# Patient Record
Sex: Female | Born: 1940 | Race: White | Hispanic: No | Marital: Single | Smoking: Former smoker
Health system: Southern US, Community
[De-identification: ages and names within clinical notes are randomized; demographics above are authoritative.]

## PROBLEM LIST (undated history)

## (undated) DIAGNOSIS — Z9289 Personal history of other medical treatment: Secondary | ICD-10-CM

## (undated) DIAGNOSIS — I10 Essential (primary) hypertension: Secondary | ICD-10-CM

## (undated) DIAGNOSIS — K5190 Ulcerative colitis, unspecified, without complications: Secondary | ICD-10-CM

## (undated) DIAGNOSIS — M1990 Unspecified osteoarthritis, unspecified site: Secondary | ICD-10-CM

## (undated) DIAGNOSIS — I739 Peripheral vascular disease, unspecified: Secondary | ICD-10-CM

## (undated) DIAGNOSIS — I2510 Atherosclerotic heart disease of native coronary artery without angina pectoris: Secondary | ICD-10-CM

## (undated) HISTORY — DX: Atherosclerotic heart disease of native coronary artery without angina pectoris: I25.10

## (undated) HISTORY — DX: Personal history of other medical treatment: Z92.89

## (undated) HISTORY — PX: CHOLECYSTECTOMY: SHX55

## (undated) HISTORY — DX: Ulcerative colitis, unspecified, without complications: K51.90

## (undated) HISTORY — PX: URETERAL STENT PLACEMENT: SHX822

## (undated) HISTORY — DX: Peripheral vascular disease, unspecified: I73.9

## (undated) HISTORY — PX: ABDOMINAL HYSTERECTOMY: SHX81

## (undated) HISTORY — PX: ABDOMINAL SURGERY: SHX537

## (undated) HISTORY — PX: APPENDECTOMY: SHX54

---

## 1998-04-01 ENCOUNTER — Encounter: Admission: RE | Admit: 1998-04-01 | Discharge: 1998-04-01 | Payer: Self-pay | Admitting: Family Medicine

## 1998-04-15 ENCOUNTER — Encounter: Admission: RE | Admit: 1998-04-15 | Discharge: 1998-04-15 | Payer: Self-pay | Admitting: Family Medicine

## 1998-04-27 ENCOUNTER — Encounter: Admission: RE | Admit: 1998-04-27 | Discharge: 1998-04-27 | Payer: Self-pay | Admitting: Family Medicine

## 1998-05-19 ENCOUNTER — Encounter: Admission: RE | Admit: 1998-05-19 | Discharge: 1998-05-19 | Payer: Self-pay | Admitting: Family Medicine

## 1998-07-02 ENCOUNTER — Encounter: Admission: RE | Admit: 1998-07-02 | Discharge: 1998-07-02 | Payer: Self-pay | Admitting: Family Medicine

## 1998-07-19 ENCOUNTER — Encounter: Admission: RE | Admit: 1998-07-19 | Discharge: 1998-07-19 | Payer: Self-pay | Admitting: Family Medicine

## 1998-08-06 ENCOUNTER — Encounter: Admission: RE | Admit: 1998-08-06 | Discharge: 1998-08-06 | Payer: Self-pay | Admitting: Family Medicine

## 1998-09-07 ENCOUNTER — Encounter: Admission: RE | Admit: 1998-09-07 | Discharge: 1998-09-07 | Payer: Self-pay | Admitting: Sports Medicine

## 1998-09-24 ENCOUNTER — Encounter: Admission: RE | Admit: 1998-09-24 | Discharge: 1998-09-24 | Payer: Self-pay | Admitting: Family Medicine

## 1998-09-24 ENCOUNTER — Other Ambulatory Visit: Admission: RE | Admit: 1998-09-24 | Discharge: 1998-09-24 | Payer: Self-pay | Admitting: *Deleted

## 1998-10-12 ENCOUNTER — Encounter: Admission: RE | Admit: 1998-10-12 | Discharge: 1998-10-12 | Payer: Self-pay | Admitting: Sports Medicine

## 1998-12-03 ENCOUNTER — Encounter: Admission: RE | Admit: 1998-12-03 | Discharge: 1998-12-03 | Payer: Self-pay | Admitting: *Deleted

## 1998-12-13 ENCOUNTER — Encounter: Admission: RE | Admit: 1998-12-13 | Discharge: 1998-12-13 | Payer: Self-pay | Admitting: Family Medicine

## 1998-12-22 ENCOUNTER — Encounter: Admission: RE | Admit: 1998-12-22 | Discharge: 1998-12-22 | Payer: Self-pay | Admitting: Sports Medicine

## 1999-01-17 ENCOUNTER — Encounter: Admission: RE | Admit: 1999-01-17 | Discharge: 1999-01-17 | Payer: Self-pay | Admitting: Family Medicine

## 1999-02-08 ENCOUNTER — Ambulatory Visit (HOSPITAL_BASED_OUTPATIENT_CLINIC_OR_DEPARTMENT_OTHER): Admission: RE | Admit: 1999-02-08 | Discharge: 1999-02-08 | Payer: Self-pay | Admitting: Otolaryngology

## 1999-02-16 ENCOUNTER — Encounter: Admission: RE | Admit: 1999-02-16 | Discharge: 1999-02-16 | Payer: Self-pay | Admitting: Family Medicine

## 1999-02-21 ENCOUNTER — Encounter: Admission: RE | Admit: 1999-02-21 | Discharge: 1999-02-21 | Payer: Self-pay | Admitting: Family Medicine

## 1999-05-12 ENCOUNTER — Encounter: Admission: RE | Admit: 1999-05-12 | Discharge: 1999-05-12 | Payer: Self-pay | Admitting: Family Medicine

## 1999-06-02 ENCOUNTER — Encounter: Admission: RE | Admit: 1999-06-02 | Discharge: 1999-06-02 | Payer: Self-pay | Admitting: Family Medicine

## 1999-08-04 ENCOUNTER — Encounter: Admission: RE | Admit: 1999-08-04 | Discharge: 1999-08-04 | Payer: Self-pay | Admitting: Family Medicine

## 1999-09-07 ENCOUNTER — Encounter: Admission: RE | Admit: 1999-09-07 | Discharge: 1999-09-07 | Payer: Self-pay | Admitting: Family Medicine

## 1999-09-07 ENCOUNTER — Other Ambulatory Visit: Admission: RE | Admit: 1999-09-07 | Discharge: 1999-09-22 | Payer: Self-pay | Admitting: Family Medicine

## 1999-09-14 ENCOUNTER — Encounter: Admission: RE | Admit: 1999-09-14 | Discharge: 1999-09-14 | Payer: Self-pay | Admitting: Family Medicine

## 1999-09-23 ENCOUNTER — Encounter: Admission: RE | Admit: 1999-09-23 | Discharge: 1999-09-23 | Payer: Self-pay | Admitting: Sports Medicine

## 1999-09-30 ENCOUNTER — Encounter: Admission: RE | Admit: 1999-09-30 | Discharge: 1999-09-30 | Payer: Self-pay | Admitting: Family Medicine

## 1999-10-14 ENCOUNTER — Encounter: Payer: Self-pay | Admitting: General Surgery

## 1999-10-14 ENCOUNTER — Encounter: Admission: RE | Admit: 1999-10-14 | Discharge: 1999-10-14 | Payer: Self-pay | Admitting: General Surgery

## 1999-10-26 ENCOUNTER — Encounter: Admission: RE | Admit: 1999-10-26 | Discharge: 1999-10-26 | Payer: Self-pay | Admitting: Family Medicine

## 1999-12-06 ENCOUNTER — Encounter: Admission: RE | Admit: 1999-12-06 | Discharge: 1999-12-06 | Payer: Self-pay | Admitting: Sports Medicine

## 1999-12-16 ENCOUNTER — Encounter: Admission: RE | Admit: 1999-12-16 | Discharge: 1999-12-16 | Payer: Self-pay | Admitting: Sports Medicine

## 2000-03-06 ENCOUNTER — Encounter: Admission: RE | Admit: 2000-03-06 | Discharge: 2000-03-06 | Payer: Self-pay | Admitting: Family Medicine

## 2000-03-27 ENCOUNTER — Encounter: Admission: RE | Admit: 2000-03-27 | Discharge: 2000-03-27 | Payer: Self-pay | Admitting: Family Medicine

## 2000-04-10 ENCOUNTER — Encounter: Payer: Self-pay | Admitting: *Deleted

## 2000-04-10 ENCOUNTER — Encounter: Admission: RE | Admit: 2000-04-10 | Discharge: 2000-04-10 | Payer: Self-pay | Admitting: *Deleted

## 2000-04-12 ENCOUNTER — Encounter: Admission: RE | Admit: 2000-04-12 | Discharge: 2000-04-12 | Payer: Self-pay | Admitting: Family Medicine

## 2000-05-16 ENCOUNTER — Encounter: Admission: RE | Admit: 2000-05-16 | Discharge: 2000-05-16 | Payer: Self-pay | Admitting: Family Medicine

## 2000-06-21 ENCOUNTER — Encounter: Admission: RE | Admit: 2000-06-21 | Discharge: 2000-06-21 | Payer: Self-pay | Admitting: Family Medicine

## 2000-08-30 ENCOUNTER — Encounter: Admission: RE | Admit: 2000-08-30 | Discharge: 2000-08-30 | Payer: Self-pay | Admitting: Family Medicine

## 2000-08-30 ENCOUNTER — Other Ambulatory Visit: Admission: RE | Admit: 2000-08-30 | Discharge: 2000-08-30 | Payer: Self-pay | Admitting: *Deleted

## 2001-01-09 ENCOUNTER — Encounter: Admission: RE | Admit: 2001-01-09 | Discharge: 2001-01-09 | Payer: Self-pay | Admitting: Family Medicine

## 2001-01-31 ENCOUNTER — Encounter: Admission: RE | Admit: 2001-01-31 | Discharge: 2001-01-31 | Payer: Self-pay | Admitting: Family Medicine

## 2001-02-27 ENCOUNTER — Encounter: Admission: RE | Admit: 2001-02-27 | Discharge: 2001-02-27 | Payer: Self-pay | Admitting: Family Medicine

## 2001-03-26 ENCOUNTER — Encounter: Admission: RE | Admit: 2001-03-26 | Discharge: 2001-03-26 | Payer: Self-pay | Admitting: Sports Medicine

## 2001-05-06 ENCOUNTER — Encounter: Admission: RE | Admit: 2001-05-06 | Discharge: 2001-05-06 | Payer: Self-pay | Admitting: Sports Medicine

## 2001-05-06 ENCOUNTER — Other Ambulatory Visit: Admission: RE | Admit: 2001-05-06 | Discharge: 2001-05-06 | Payer: Self-pay | Admitting: *Deleted

## 2001-07-01 ENCOUNTER — Encounter: Admission: RE | Admit: 2001-07-01 | Discharge: 2001-07-01 | Payer: Self-pay | Admitting: Family Medicine

## 2001-07-05 ENCOUNTER — Encounter: Admission: RE | Admit: 2001-07-05 | Discharge: 2001-07-05 | Payer: Self-pay | Admitting: Sports Medicine

## 2001-07-05 ENCOUNTER — Encounter: Payer: Self-pay | Admitting: Sports Medicine

## 2001-07-24 ENCOUNTER — Encounter: Admission: RE | Admit: 2001-07-24 | Discharge: 2001-07-24 | Payer: Self-pay | Admitting: Family Medicine

## 2001-08-29 ENCOUNTER — Encounter: Admission: RE | Admit: 2001-08-29 | Discharge: 2001-08-29 | Payer: Self-pay | Admitting: Urology

## 2001-08-29 ENCOUNTER — Encounter: Payer: Self-pay | Admitting: Urology

## 2001-10-28 ENCOUNTER — Encounter: Payer: Self-pay | Admitting: Urology

## 2001-10-31 ENCOUNTER — Observation Stay (HOSPITAL_COMMUNITY): Admission: RE | Admit: 2001-10-31 | Discharge: 2001-11-01 | Payer: Self-pay | Admitting: Urology

## 2001-12-09 ENCOUNTER — Encounter: Admission: RE | Admit: 2001-12-09 | Discharge: 2001-12-09 | Payer: Self-pay | Admitting: Family Medicine

## 2001-12-24 ENCOUNTER — Encounter: Payer: Self-pay | Admitting: Sports Medicine

## 2001-12-24 ENCOUNTER — Encounter: Admission: RE | Admit: 2001-12-24 | Discharge: 2001-12-24 | Payer: Self-pay | Admitting: *Deleted

## 2002-04-16 ENCOUNTER — Encounter: Admission: RE | Admit: 2002-04-16 | Discharge: 2002-04-16 | Payer: Self-pay | Admitting: Family Medicine

## 2002-05-21 ENCOUNTER — Encounter: Admission: RE | Admit: 2002-05-21 | Discharge: 2002-05-21 | Payer: Self-pay | Admitting: Family Medicine

## 2002-05-21 ENCOUNTER — Ambulatory Visit (HOSPITAL_COMMUNITY): Admission: RE | Admit: 2002-05-21 | Discharge: 2002-05-21 | Payer: Self-pay | Admitting: Family Medicine

## 2002-05-21 ENCOUNTER — Other Ambulatory Visit: Admission: RE | Admit: 2002-05-21 | Discharge: 2002-05-21 | Payer: Self-pay | Admitting: Family Medicine

## 2002-07-28 ENCOUNTER — Encounter: Admission: RE | Admit: 2002-07-28 | Discharge: 2002-07-28 | Payer: Self-pay | Admitting: Family Medicine

## 2002-09-08 ENCOUNTER — Encounter: Admission: RE | Admit: 2002-09-08 | Discharge: 2002-09-08 | Payer: Self-pay | Admitting: Sports Medicine

## 2002-10-16 ENCOUNTER — Encounter: Admission: RE | Admit: 2002-10-16 | Discharge: 2002-10-16 | Payer: Self-pay | Admitting: Family Medicine

## 2002-11-17 ENCOUNTER — Encounter: Admission: RE | Admit: 2002-11-17 | Discharge: 2002-11-17 | Payer: Self-pay | Admitting: Family Medicine

## 2002-12-08 ENCOUNTER — Encounter: Admission: RE | Admit: 2002-12-08 | Discharge: 2002-12-08 | Payer: Self-pay | Admitting: Family Medicine

## 2002-12-18 ENCOUNTER — Encounter: Admission: RE | Admit: 2002-12-18 | Discharge: 2002-12-18 | Payer: Self-pay | Admitting: Sports Medicine

## 2002-12-18 ENCOUNTER — Encounter: Payer: Self-pay | Admitting: Sports Medicine

## 2003-04-27 ENCOUNTER — Encounter: Admission: RE | Admit: 2003-04-27 | Discharge: 2003-04-27 | Payer: Self-pay | Admitting: Family Medicine

## 2003-06-08 ENCOUNTER — Encounter: Admission: RE | Admit: 2003-06-08 | Discharge: 2003-06-08 | Payer: Self-pay | Admitting: Family Medicine

## 2003-08-25 ENCOUNTER — Encounter (INDEPENDENT_AMBULATORY_CARE_PROVIDER_SITE_OTHER): Payer: Self-pay | Admitting: *Deleted

## 2003-09-01 ENCOUNTER — Encounter: Admission: RE | Admit: 2003-09-01 | Discharge: 2003-09-01 | Payer: Self-pay | Admitting: Family Medicine

## 2003-09-01 ENCOUNTER — Other Ambulatory Visit: Admission: RE | Admit: 2003-09-01 | Discharge: 2003-09-01 | Payer: Self-pay | Admitting: Sports Medicine

## 2003-10-16 ENCOUNTER — Encounter: Admission: RE | Admit: 2003-10-16 | Discharge: 2003-10-16 | Payer: Self-pay | Admitting: Family Medicine

## 2003-11-19 ENCOUNTER — Encounter: Admission: RE | Admit: 2003-11-19 | Discharge: 2003-11-19 | Payer: Self-pay | Admitting: Family Medicine

## 2004-01-22 ENCOUNTER — Encounter: Admission: RE | Admit: 2004-01-22 | Discharge: 2004-01-22 | Payer: Self-pay | Admitting: Family Medicine

## 2004-02-19 ENCOUNTER — Encounter: Admission: RE | Admit: 2004-02-19 | Discharge: 2004-02-19 | Payer: Self-pay | Admitting: Sports Medicine

## 2004-04-25 ENCOUNTER — Ambulatory Visit: Payer: Self-pay | Admitting: Family Medicine

## 2004-05-30 ENCOUNTER — Ambulatory Visit: Payer: Self-pay | Admitting: Sports Medicine

## 2004-05-31 ENCOUNTER — Ambulatory Visit: Payer: Self-pay | Admitting: Family Medicine

## 2004-06-30 ENCOUNTER — Ambulatory Visit: Payer: Self-pay | Admitting: Family Medicine

## 2004-09-22 ENCOUNTER — Ambulatory Visit: Payer: Self-pay | Admitting: Family Medicine

## 2004-10-17 ENCOUNTER — Ambulatory Visit: Payer: Self-pay | Admitting: Family Medicine

## 2004-10-18 ENCOUNTER — Ambulatory Visit: Payer: Self-pay | Admitting: Gastroenterology

## 2004-10-31 ENCOUNTER — Ambulatory Visit: Payer: Self-pay | Admitting: Internal Medicine

## 2004-11-03 ENCOUNTER — Ambulatory Visit: Payer: Self-pay | Admitting: Family Medicine

## 2004-12-12 ENCOUNTER — Ambulatory Visit: Payer: Self-pay | Admitting: Family Medicine

## 2004-12-19 ENCOUNTER — Ambulatory Visit: Payer: Self-pay | Admitting: Family Medicine

## 2005-01-09 ENCOUNTER — Ambulatory Visit: Payer: Self-pay | Admitting: Internal Medicine

## 2005-01-12 ENCOUNTER — Ambulatory Visit: Payer: Self-pay | Admitting: Internal Medicine

## 2005-01-12 ENCOUNTER — Encounter (INDEPENDENT_AMBULATORY_CARE_PROVIDER_SITE_OTHER): Payer: Self-pay | Admitting: *Deleted

## 2005-01-12 ENCOUNTER — Other Ambulatory Visit: Admission: RE | Admit: 2005-01-12 | Discharge: 2005-01-12 | Payer: Self-pay | Admitting: Internal Medicine

## 2005-04-06 ENCOUNTER — Ambulatory Visit: Payer: Self-pay | Admitting: Internal Medicine

## 2005-05-02 ENCOUNTER — Ambulatory Visit: Payer: Self-pay | Admitting: Family Medicine

## 2005-05-23 ENCOUNTER — Ambulatory Visit: Payer: Self-pay | Admitting: Internal Medicine

## 2005-05-25 ENCOUNTER — Ambulatory Visit (HOSPITAL_COMMUNITY): Admission: RE | Admit: 2005-05-25 | Discharge: 2005-05-25 | Payer: Self-pay | Admitting: Internal Medicine

## 2005-05-31 ENCOUNTER — Ambulatory Visit: Payer: Self-pay | Admitting: Internal Medicine

## 2005-06-07 ENCOUNTER — Ambulatory Visit: Payer: Self-pay | Admitting: Cardiology

## 2005-08-15 ENCOUNTER — Ambulatory Visit: Payer: Self-pay | Admitting: Internal Medicine

## 2005-08-29 ENCOUNTER — Ambulatory Visit: Payer: Self-pay | Admitting: Family Medicine

## 2006-01-04 ENCOUNTER — Ambulatory Visit: Payer: Self-pay | Admitting: Family Medicine

## 2006-01-09 ENCOUNTER — Ambulatory Visit: Payer: Self-pay | Admitting: Sports Medicine

## 2006-02-14 ENCOUNTER — Ambulatory Visit: Payer: Self-pay | Admitting: Family Medicine

## 2006-02-16 ENCOUNTER — Encounter: Admission: RE | Admit: 2006-02-16 | Discharge: 2006-02-16 | Payer: Self-pay | Admitting: Family Medicine

## 2006-03-06 ENCOUNTER — Ambulatory Visit: Payer: Self-pay | Admitting: Sports Medicine

## 2006-03-26 HISTORY — PX: COLON SURGERY: SHX602

## 2006-03-30 ENCOUNTER — Ambulatory Visit: Payer: Self-pay | Admitting: Family Medicine

## 2006-04-13 ENCOUNTER — Encounter: Payer: Self-pay | Admitting: Internal Medicine

## 2006-04-13 ENCOUNTER — Encounter (INDEPENDENT_AMBULATORY_CARE_PROVIDER_SITE_OTHER): Payer: Self-pay | Admitting: *Deleted

## 2006-04-13 ENCOUNTER — Inpatient Hospital Stay (HOSPITAL_COMMUNITY): Admission: EM | Admit: 2006-04-13 | Discharge: 2006-04-20 | Payer: Self-pay | Admitting: Emergency Medicine

## 2006-04-13 ENCOUNTER — Encounter (INDEPENDENT_AMBULATORY_CARE_PROVIDER_SITE_OTHER): Payer: Self-pay | Admitting: Specialist

## 2006-04-13 ENCOUNTER — Ambulatory Visit: Payer: Self-pay | Admitting: Critical Care Medicine

## 2006-04-18 ENCOUNTER — Ambulatory Visit: Payer: Self-pay | Admitting: Internal Medicine

## 2006-05-03 ENCOUNTER — Ambulatory Visit: Payer: Self-pay | Admitting: Sports Medicine

## 2006-05-30 ENCOUNTER — Ambulatory Visit: Payer: Self-pay | Admitting: Family Medicine

## 2006-06-07 ENCOUNTER — Ambulatory Visit: Payer: Self-pay | Admitting: Family Medicine

## 2006-06-13 ENCOUNTER — Ambulatory Visit: Payer: Self-pay | Admitting: Family Medicine

## 2006-07-09 ENCOUNTER — Ambulatory Visit: Payer: Self-pay | Admitting: Family Medicine

## 2006-08-10 ENCOUNTER — Encounter: Payer: Self-pay | Admitting: Family Medicine

## 2006-08-10 ENCOUNTER — Ambulatory Visit: Payer: Self-pay | Admitting: Family Medicine

## 2006-08-23 DIAGNOSIS — F339 Major depressive disorder, recurrent, unspecified: Secondary | ICD-10-CM | POA: Insufficient documentation

## 2006-08-23 DIAGNOSIS — M25559 Pain in unspecified hip: Secondary | ICD-10-CM | POA: Insufficient documentation

## 2006-08-23 DIAGNOSIS — I10 Essential (primary) hypertension: Secondary | ICD-10-CM | POA: Insufficient documentation

## 2006-08-23 DIAGNOSIS — M545 Low back pain, unspecified: Secondary | ICD-10-CM | POA: Insufficient documentation

## 2006-08-23 DIAGNOSIS — K219 Gastro-esophageal reflux disease without esophagitis: Secondary | ICD-10-CM | POA: Insufficient documentation

## 2006-08-23 DIAGNOSIS — N393 Stress incontinence (female) (male): Secondary | ICD-10-CM | POA: Insufficient documentation

## 2006-08-23 DIAGNOSIS — J309 Allergic rhinitis, unspecified: Secondary | ICD-10-CM | POA: Insufficient documentation

## 2006-08-23 DIAGNOSIS — K589 Irritable bowel syndrome without diarrhea: Secondary | ICD-10-CM | POA: Insufficient documentation

## 2006-08-23 DIAGNOSIS — E7800 Pure hypercholesterolemia, unspecified: Secondary | ICD-10-CM | POA: Insufficient documentation

## 2006-08-24 ENCOUNTER — Encounter (INDEPENDENT_AMBULATORY_CARE_PROVIDER_SITE_OTHER): Payer: Self-pay | Admitting: *Deleted

## 2006-09-21 ENCOUNTER — Telehealth: Payer: Self-pay | Admitting: *Deleted

## 2006-10-02 ENCOUNTER — Telehealth: Payer: Self-pay | Admitting: *Deleted

## 2006-10-03 ENCOUNTER — Telehealth: Payer: Self-pay | Admitting: *Deleted

## 2006-10-10 ENCOUNTER — Ambulatory Visit: Payer: Self-pay | Admitting: Internal Medicine

## 2006-10-15 ENCOUNTER — Ambulatory Visit (HOSPITAL_COMMUNITY): Admission: RE | Admit: 2006-10-15 | Discharge: 2006-10-15 | Payer: Self-pay | Admitting: Internal Medicine

## 2006-10-23 ENCOUNTER — Ambulatory Visit (HOSPITAL_COMMUNITY): Admission: RE | Admit: 2006-10-23 | Discharge: 2006-10-23 | Payer: Self-pay | Admitting: Internal Medicine

## 2006-10-23 ENCOUNTER — Telehealth (INDEPENDENT_AMBULATORY_CARE_PROVIDER_SITE_OTHER): Payer: Self-pay | Admitting: *Deleted

## 2006-11-08 ENCOUNTER — Ambulatory Visit: Payer: Self-pay | Admitting: Sports Medicine

## 2006-11-08 ENCOUNTER — Ambulatory Visit: Payer: Self-pay | Admitting: Internal Medicine

## 2006-11-08 ENCOUNTER — Encounter: Payer: Self-pay | Admitting: Family Medicine

## 2006-11-08 DIAGNOSIS — L259 Unspecified contact dermatitis, unspecified cause: Secondary | ICD-10-CM | POA: Insufficient documentation

## 2006-11-21 ENCOUNTER — Telehealth: Payer: Self-pay | Admitting: *Deleted

## 2006-11-22 ENCOUNTER — Encounter: Payer: Self-pay | Admitting: Internal Medicine

## 2006-11-22 ENCOUNTER — Ambulatory Visit: Payer: Self-pay | Admitting: Internal Medicine

## 2006-11-23 ENCOUNTER — Telehealth: Payer: Self-pay | Admitting: Family Medicine

## 2006-12-31 ENCOUNTER — Encounter: Payer: Self-pay | Admitting: *Deleted

## 2007-01-01 ENCOUNTER — Telehealth: Payer: Self-pay | Admitting: Family Medicine

## 2007-01-03 ENCOUNTER — Encounter: Payer: Self-pay | Admitting: Family Medicine

## 2007-01-03 ENCOUNTER — Telehealth: Payer: Self-pay | Admitting: *Deleted

## 2007-01-04 ENCOUNTER — Encounter: Payer: Self-pay | Admitting: *Deleted

## 2007-01-07 ENCOUNTER — Encounter: Payer: Self-pay | Admitting: *Deleted

## 2007-03-19 ENCOUNTER — Telehealth: Payer: Self-pay | Admitting: *Deleted

## 2007-03-26 ENCOUNTER — Telehealth: Payer: Self-pay | Admitting: *Deleted

## 2007-03-27 ENCOUNTER — Ambulatory Visit: Payer: Self-pay | Admitting: Family Medicine

## 2007-03-27 ENCOUNTER — Encounter: Payer: Self-pay | Admitting: Family Medicine

## 2007-03-28 ENCOUNTER — Encounter: Payer: Self-pay | Admitting: Family Medicine

## 2007-03-28 LAB — CONVERTED CEMR LAB
Calcium: 9.5 mg/dL (ref 8.4–10.5)
Creatinine, Ser: 0.71 mg/dL (ref 0.40–1.20)
HCT: 35.1 % — ABNORMAL LOW (ref 36.0–46.0)
HDL: 72 mg/dL (ref >39)
MCV: 93.6 fL (ref 78.0–100.0)
Platelets: 280 10*3/uL (ref 150–400)
RDW: 14.8 % — ABNORMAL HIGH (ref 11.5–14.0)
Triglycerides: 50 mg/dL (ref <150)

## 2007-04-04 ENCOUNTER — Telehealth: Payer: Self-pay | Admitting: Family Medicine

## 2007-04-12 ENCOUNTER — Telehealth: Payer: Self-pay | Admitting: *Deleted

## 2007-04-23 ENCOUNTER — Telehealth (INDEPENDENT_AMBULATORY_CARE_PROVIDER_SITE_OTHER): Payer: Self-pay | Admitting: *Deleted

## 2007-05-21 ENCOUNTER — Ambulatory Visit: Payer: Self-pay | Admitting: Internal Medicine

## 2007-05-21 LAB — CONVERTED CEMR LAB
Basophils Absolute: 0.1 10*3/uL (ref 0.0–0.1)
Hemoglobin: 11.8 g/dL — ABNORMAL LOW (ref 12.0–15.0)
Iron: 45 ug/dL (ref 42–145)
MCHC: 33.7 g/dL (ref 30.0–36.0)
Monocytes Absolute: 0.5 10*3/uL (ref 0.2–0.7)
Monocytes Relative: 9.1 % (ref 3.0–11.0)
RDW: 12.7 % (ref 11.5–14.6)

## 2007-05-31 ENCOUNTER — Encounter: Payer: Self-pay | Admitting: Family Medicine

## 2007-06-03 ENCOUNTER — Telehealth: Payer: Self-pay | Admitting: Family Medicine

## 2007-06-10 ENCOUNTER — Telehealth: Payer: Self-pay | Admitting: Family Medicine

## 2007-07-11 ENCOUNTER — Telehealth: Payer: Self-pay | Admitting: *Deleted

## 2007-07-12 ENCOUNTER — Telehealth: Payer: Self-pay | Admitting: *Deleted

## 2007-07-25 ENCOUNTER — Ambulatory Visit: Payer: Self-pay | Admitting: Internal Medicine

## 2007-08-02 ENCOUNTER — Encounter (INDEPENDENT_AMBULATORY_CARE_PROVIDER_SITE_OTHER): Payer: Self-pay | Admitting: *Deleted

## 2007-09-13 ENCOUNTER — Encounter: Payer: Self-pay | Admitting: *Deleted

## 2007-10-14 IMAGING — CT CT ABDOMEN W/ CM
2 of 5 series · 15 of 46 positions shown, 17 images · IV contrast (omnipaque)
Comparison: Small bowel series 10/15/06.

CLINICAL DATA: The patient is status-post right hemicolectomy for cecal volvulus and strangulated bowel in [DATE]. A small bowel follow-through was performed on 10/15/06 for abdominal pain. This revealed a possible intraluminal mass of the distal small bowel at the level of ileocolic anastomosis with a patent anastomosis present. Further evaluation is now performed by CT.
ABDOMEN CT WITH CONTRAST:
TECHNIQUE: Multidetector CT imaging of the abdomen was performed following the standard protocol during bolus administration of intravenous contrast.
Contrast:   100 cc Omnipaque 300 IV. Oral contrast was also administered prior to the study.
TECHNIQUE: Multidetector CT imaging of the pelvis was performed following the standard protocol during bolus administration of intravenous contrast.

[Series 2: abd_pel 5.0 b40s · axial · 0.74mm/px · z∈[+720,+1106]mm · 12 of 87 slices shown, 14 images]
[im 5/87  soft-tissue]
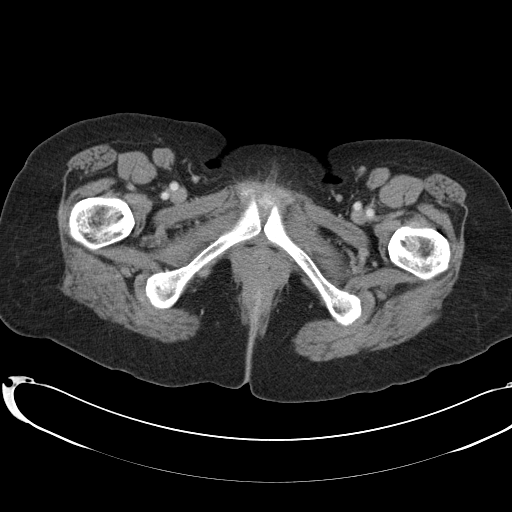
[im 5/87  bone]
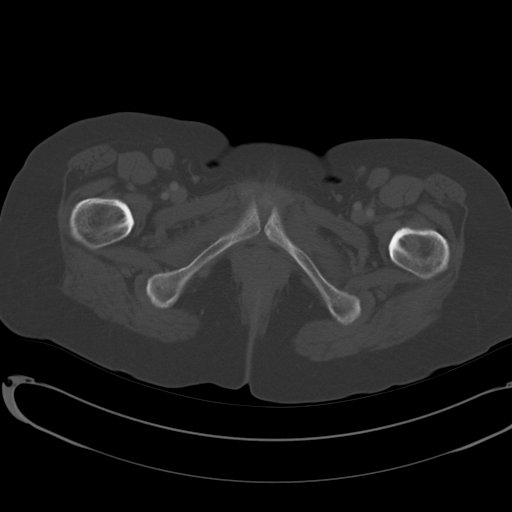
[im 15/87  soft-tissue]
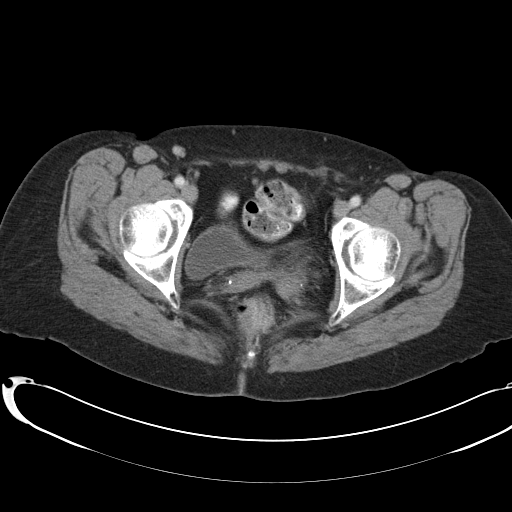
[im 20/87  soft-tissue]
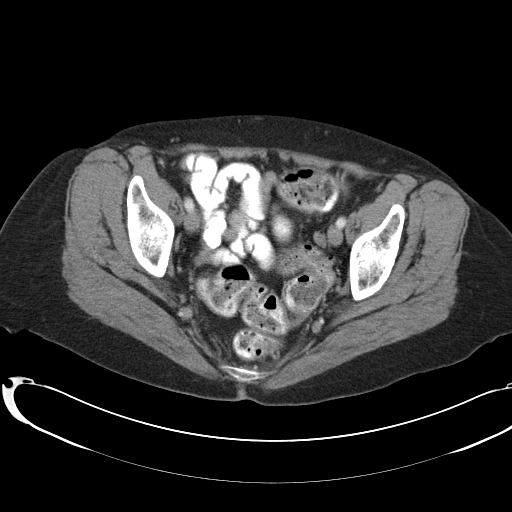
[im 24/87  soft-tissue]
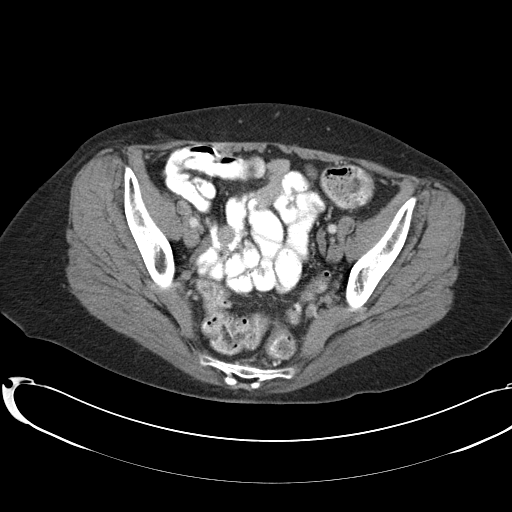
[im 34/87  soft-tissue]
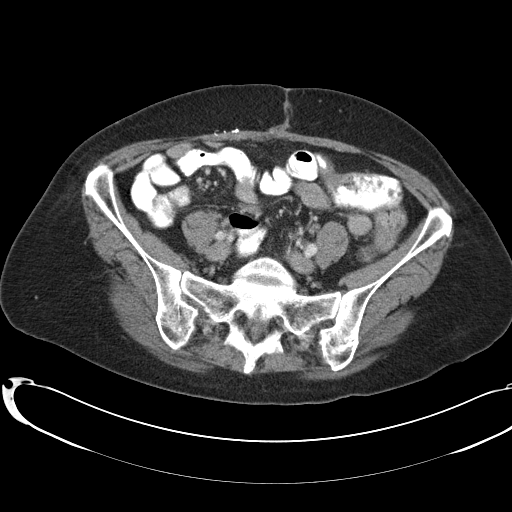
[im 39/87  soft-tissue]
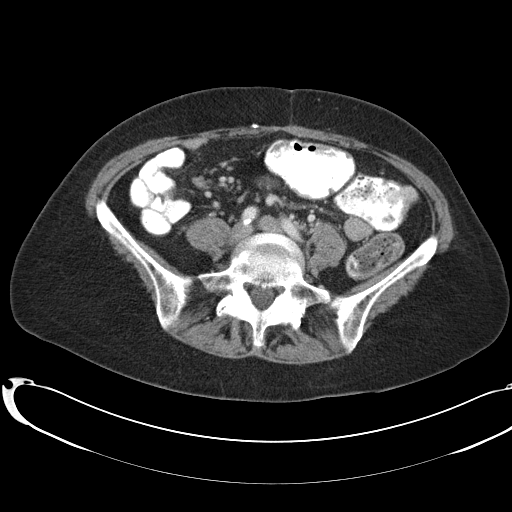
[im 48/87  soft-tissue]
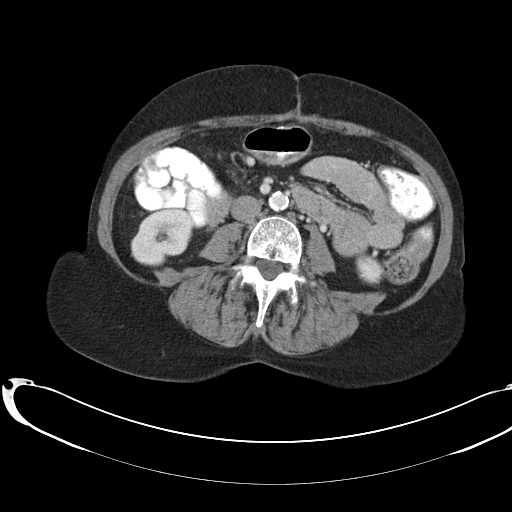
[im 53/87  soft-tissue]
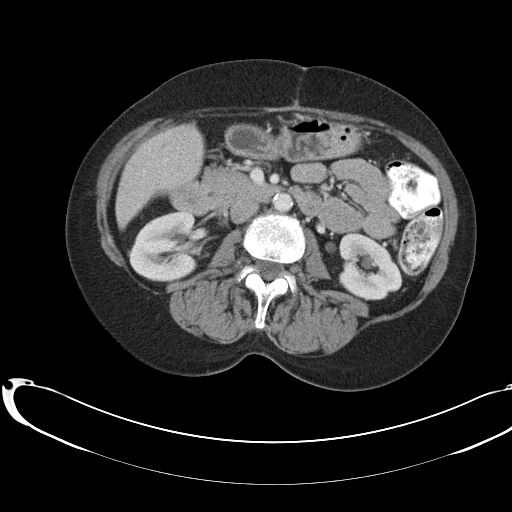
[im 63/87  soft-tissue]
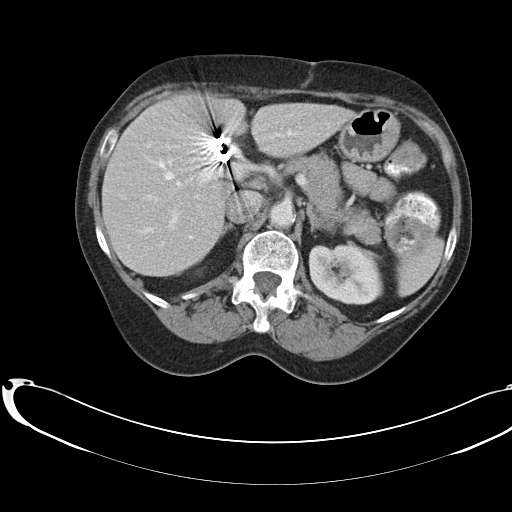
[im 63/87  bone]
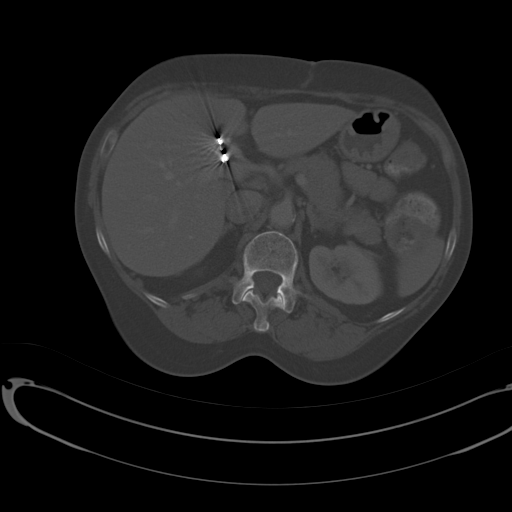
[im 67/87  soft-tissue]
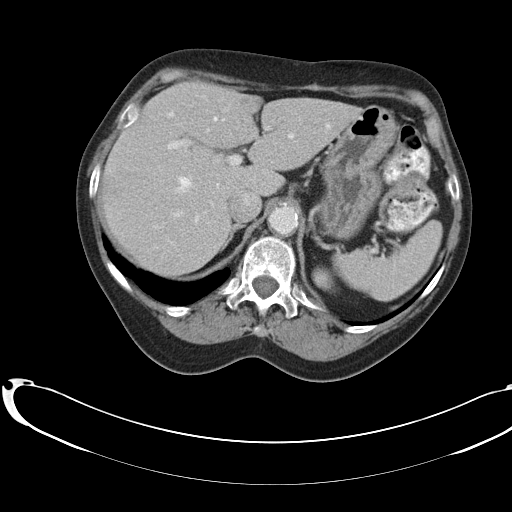
[im 72/87  soft-tissue]
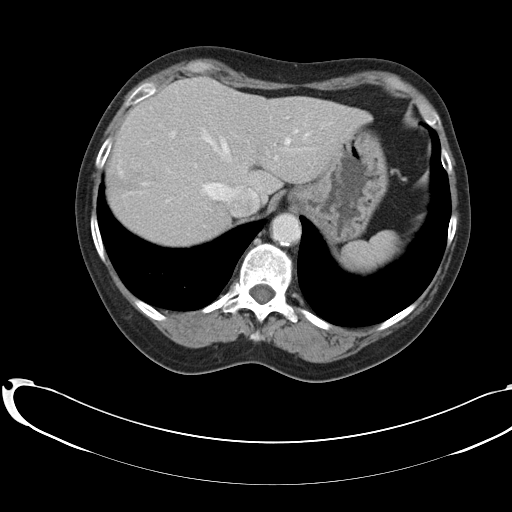
[im 82/87  soft-tissue]
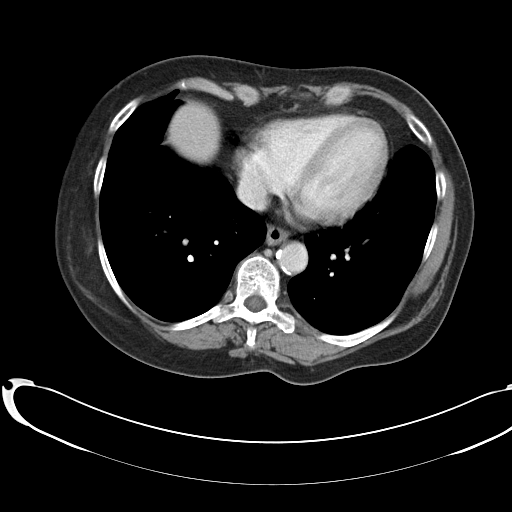

[Series 602: <mpr thick range> · coronal · 0.85mm/px · 3 of 70 slices shown]
[im 24/70  soft-tissue]
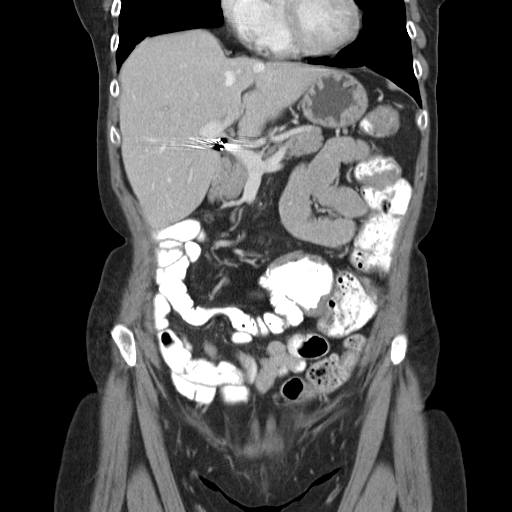
[im 31/70  soft-tissue]
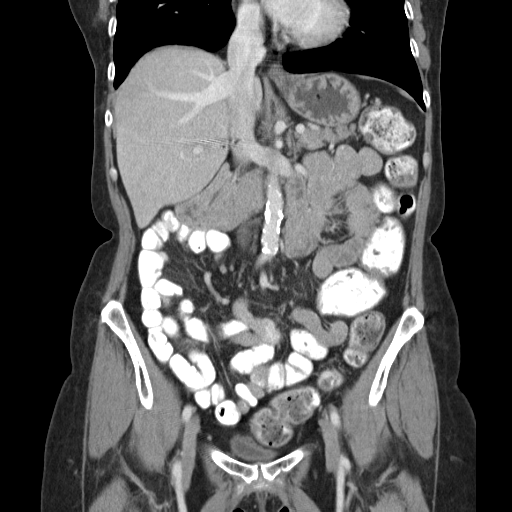
[im 39/70  soft-tissue]
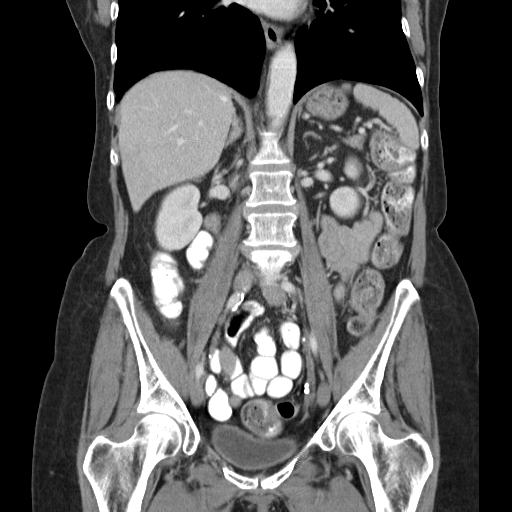

[15 of 46 positions shown; findings below may reference images not displayed]

FINDINGS: The visualized lung bases show parenchymal scarring at the anterior right lung base. 
In the lower abdomen, the surgical ileocolic anastomosis is well visualized by CT and shows normal patency with oral contrast present in the distal ileum as well as the proximal colon.  At the level of the anastomosis, no small bowel mass is present. There is a redundant segment of colon superior to the anastomosis, which is underopacified and underdistended. The rest of the proximal colon beyond the anastomosis is well opacified with oral contrast. 
Approximately 4 cm beyond the ileocolic anastomosis, a segment of colonic narrowing is present with suggestion of concentric intraluminal soft tissue prominence measuring approximately 1 cm in greatest thickness. This does visibly constrict the lumen. The findings may simply be related to a wave of peristalsis and/or adherent fecal material. However, a concentric mass lesion in the colon at this level is not excluded. Direct visualization with colonoscopy may be necessary to exclude a mass. This segment was not very well studied at the time of the small bowel followthrough. 
There is no evidence of bowel obstruction. No free fluid or inflammatory changes. The liver, spleen, pancreas, adrenal glands, and kidneys are within normal limits. The gallbladder has been surgically removed. No enlarged lymph nodes.
IMPRESSION: Patent ileocolic anastomosis with no evidence of soft tissue mass in the distal small bowel or adjacent soft tissues. There is, however, additional finding in the colon just beyond the ileocolic anastomosis of focal narrowing and concentric intraluminal soft tissue prominence. A constricting mass lesion is not excluded by CT. This may simply relate to peristalsis. Further direct visualization with colonoscopy may be necessary to exclude tumor.  There is no associated obstruction or enlarged lymph nodes. With respect to the small bowel followthrough study, the previous finding likely is a pseudotumor appearance related to the anastomosis as well as an additional short redundant segment of colon superior to the anastomosis. 
PELVIS CT WITH CONTRAST:
FINDINGS: Pelvic bowel loops are of normal caliber. No obstruction, free fluid, or inflammatory changes. No hernias. The bladder is unremarkable. No enlarged lymph nodes in the pelvis.
IMPRESSION: Normal CT of the pelvis.

## 2007-10-16 ENCOUNTER — Telehealth: Payer: Self-pay | Admitting: Family Medicine

## 2007-10-30 ENCOUNTER — Ambulatory Visit: Payer: Self-pay | Admitting: Family Medicine

## 2007-10-30 ENCOUNTER — Encounter: Payer: Self-pay | Admitting: Family Medicine

## 2007-10-30 LAB — CONVERTED CEMR LAB
MCHC: 31.7 g/dL (ref 30.0–36.0)
MCV: 94.9 fL (ref 78.0–100.0)
Platelets: 268 10*3/uL (ref 150–400)
TSH: 1.66 microintl units/mL (ref 0.350–5.50)
WBC: 4.9 10*3/uL (ref 4.0–10.5)

## 2007-11-14 ENCOUNTER — Other Ambulatory Visit: Admission: RE | Admit: 2007-11-14 | Discharge: 2007-11-14 | Payer: Self-pay | Admitting: Family Medicine

## 2007-11-14 ENCOUNTER — Ambulatory Visit: Payer: Self-pay | Admitting: Family Medicine

## 2007-11-25 ENCOUNTER — Telehealth: Payer: Self-pay | Admitting: *Deleted

## 2007-12-06 ENCOUNTER — Telehealth: Payer: Self-pay | Admitting: Internal Medicine

## 2007-12-31 ENCOUNTER — Encounter: Admission: RE | Admit: 2007-12-31 | Discharge: 2007-12-31 | Payer: Self-pay | Admitting: Family Medicine

## 2008-01-02 ENCOUNTER — Encounter: Payer: Self-pay | Admitting: Family Medicine

## 2008-01-09 ENCOUNTER — Encounter: Payer: Self-pay | Admitting: Family Medicine

## 2008-01-14 ENCOUNTER — Encounter: Payer: Self-pay | Admitting: Family Medicine

## 2008-01-15 ENCOUNTER — Telehealth: Payer: Self-pay | Admitting: Family Medicine

## 2008-02-03 ENCOUNTER — Encounter (INDEPENDENT_AMBULATORY_CARE_PROVIDER_SITE_OTHER): Payer: Self-pay | Admitting: *Deleted

## 2008-02-19 ENCOUNTER — Ambulatory Visit: Payer: Self-pay | Admitting: Family Medicine

## 2008-02-26 ENCOUNTER — Ambulatory Visit: Payer: Self-pay | Admitting: Internal Medicine

## 2008-03-05 ENCOUNTER — Telehealth: Payer: Self-pay | Admitting: Internal Medicine

## 2008-03-17 ENCOUNTER — Telehealth: Payer: Self-pay | Admitting: *Deleted

## 2008-04-23 ENCOUNTER — Encounter: Payer: Self-pay | Admitting: Family Medicine

## 2008-04-23 ENCOUNTER — Ambulatory Visit: Payer: Self-pay | Admitting: Family Medicine

## 2008-04-23 DIAGNOSIS — T148XXA Other injury of unspecified body region, initial encounter: Secondary | ICD-10-CM | POA: Insufficient documentation

## 2008-04-23 LAB — CONVERTED CEMR LAB
Cholesterol: 166 mg/dL (ref 0–200)
MCHC: 34 g/dL (ref 30.0–36.0)
Platelets: 278 10*3/uL (ref 150–400)
Potassium: 4.3 meq/L (ref 3.5–5.3)
RBC: 3.58 M/uL — ABNORMAL LOW (ref 3.87–5.11)
RDW: 13.8 % (ref 11.5–15.5)
Sodium: 135 meq/L (ref 135–145)
Total CHOL/HDL Ratio: 2.6
Triglycerides: 90 mg/dL (ref <150)
VLDL: 18 mg/dL (ref 0–40)

## 2008-04-24 ENCOUNTER — Encounter: Payer: Self-pay | Admitting: Family Medicine

## 2008-04-24 ENCOUNTER — Telehealth: Payer: Self-pay | Admitting: *Deleted

## 2008-06-30 ENCOUNTER — Telehealth: Payer: Self-pay | Admitting: Internal Medicine

## 2008-07-01 ENCOUNTER — Telehealth: Payer: Self-pay | Admitting: Internal Medicine

## 2008-07-09 ENCOUNTER — Telehealth (INDEPENDENT_AMBULATORY_CARE_PROVIDER_SITE_OTHER): Payer: Self-pay | Admitting: *Deleted

## 2008-07-09 ENCOUNTER — Telehealth: Payer: Self-pay | Admitting: Family Medicine

## 2008-07-23 ENCOUNTER — Encounter: Payer: Self-pay | Admitting: Family Medicine

## 2008-08-12 ENCOUNTER — Telehealth: Payer: Self-pay | Admitting: Family Medicine

## 2008-08-13 ENCOUNTER — Encounter: Payer: Self-pay | Admitting: Family Medicine

## 2008-08-21 ENCOUNTER — Telehealth: Payer: Self-pay | Admitting: Psychology

## 2008-08-24 DIAGNOSIS — I2510 Atherosclerotic heart disease of native coronary artery without angina pectoris: Secondary | ICD-10-CM

## 2008-08-24 HISTORY — DX: Atherosclerotic heart disease of native coronary artery without angina pectoris: I25.10

## 2008-08-25 ENCOUNTER — Encounter: Payer: Self-pay | Admitting: *Deleted

## 2008-08-26 ENCOUNTER — Ambulatory Visit: Payer: Self-pay | Admitting: Family Medicine

## 2008-08-28 ENCOUNTER — Telehealth: Payer: Self-pay | Admitting: *Deleted

## 2008-09-08 ENCOUNTER — Encounter: Payer: Self-pay | Admitting: Family Medicine

## 2008-09-17 ENCOUNTER — Encounter: Payer: Self-pay | Admitting: Family Medicine

## 2008-09-17 ENCOUNTER — Encounter: Payer: Self-pay | Admitting: Psychology

## 2008-09-17 ENCOUNTER — Encounter: Payer: Self-pay | Admitting: Internal Medicine

## 2008-09-17 HISTORY — PX: CARDIAC CATHETERIZATION: SHX172

## 2008-09-21 ENCOUNTER — Encounter: Payer: Self-pay | Admitting: Family Medicine

## 2008-09-21 DIAGNOSIS — I701 Atherosclerosis of renal artery: Secondary | ICD-10-CM | POA: Insufficient documentation

## 2008-09-21 DIAGNOSIS — G458 Other transient cerebral ischemic attacks and related syndromes: Secondary | ICD-10-CM | POA: Insufficient documentation

## 2008-09-28 ENCOUNTER — Inpatient Hospital Stay (HOSPITAL_COMMUNITY): Admission: AD | Admit: 2008-09-28 | Discharge: 2008-09-29 | Payer: Self-pay | Admitting: Cardiovascular Disease

## 2008-09-28 HISTORY — PX: SUBCLAVIAN VEIN ANGIOPLASTY / STENTING: SHX2453

## 2008-09-29 ENCOUNTER — Encounter (INDEPENDENT_AMBULATORY_CARE_PROVIDER_SITE_OTHER): Payer: Self-pay | Admitting: *Deleted

## 2008-11-09 ENCOUNTER — Encounter: Payer: Self-pay | Admitting: Family Medicine

## 2008-12-30 ENCOUNTER — Encounter: Payer: Self-pay | Admitting: Family Medicine

## 2009-01-02 ENCOUNTER — Telehealth: Payer: Self-pay | Admitting: Sports Medicine

## 2009-01-04 ENCOUNTER — Encounter: Payer: Self-pay | Admitting: Family Medicine

## 2009-01-04 ENCOUNTER — Telehealth: Payer: Self-pay | Admitting: Family Medicine

## 2009-01-04 ENCOUNTER — Encounter: Payer: Self-pay | Admitting: *Deleted

## 2009-01-07 ENCOUNTER — Telehealth: Payer: Self-pay | Admitting: Family Medicine

## 2009-01-22 ENCOUNTER — Telehealth: Payer: Self-pay | Admitting: Internal Medicine

## 2009-01-25 ENCOUNTER — Ambulatory Visit: Payer: Self-pay | Admitting: Internal Medicine

## 2009-01-25 ENCOUNTER — Encounter: Payer: Self-pay | Admitting: Physician Assistant

## 2009-01-25 ENCOUNTER — Ambulatory Visit: Payer: Self-pay | Admitting: Family Medicine

## 2009-01-25 ENCOUNTER — Telehealth: Payer: Self-pay | Admitting: Physician Assistant

## 2009-01-25 DIAGNOSIS — R197 Diarrhea, unspecified: Secondary | ICD-10-CM | POA: Insufficient documentation

## 2009-01-25 DIAGNOSIS — R1084 Generalized abdominal pain: Secondary | ICD-10-CM | POA: Insufficient documentation

## 2009-01-25 DIAGNOSIS — I2510 Atherosclerotic heart disease of native coronary artery without angina pectoris: Secondary | ICD-10-CM | POA: Insufficient documentation

## 2009-01-25 LAB — CONVERTED CEMR LAB
Basophils Absolute: 0 10*3/uL (ref 0.0–0.1)
Eosinophils Absolute: 0.1 10*3/uL (ref 0.0–0.7)
Iron: 42 ug/dL (ref 42–145)
Lymphocytes Relative: 26.8 % (ref 12.0–46.0)
MCHC: 34 g/dL (ref 30.0–36.0)
Monocytes Absolute: 0.5 10*3/uL (ref 0.1–1.0)
Neutro Abs: 4.5 10*3/uL (ref 1.4–7.7)
Neutrophils Relative %: 63.1 % (ref 43.0–77.0)
RDW: 13.2 % (ref 11.5–14.6)

## 2009-01-27 ENCOUNTER — Telehealth: Payer: Self-pay | Admitting: *Deleted

## 2009-01-27 ENCOUNTER — Encounter: Payer: Self-pay | Admitting: Physician Assistant

## 2009-01-28 LAB — CONVERTED CEMR LAB: Tissue Transglutaminase Ab, IgA: 0.7 units (ref <7)

## 2009-02-03 ENCOUNTER — Telehealth: Payer: Self-pay | Admitting: Physician Assistant

## 2009-02-11 ENCOUNTER — Encounter: Payer: Self-pay | Admitting: Family Medicine

## 2009-02-25 ENCOUNTER — Telehealth: Payer: Self-pay | Admitting: Internal Medicine

## 2009-02-26 ENCOUNTER — Encounter: Payer: Self-pay | Admitting: *Deleted

## 2009-02-26 ENCOUNTER — Encounter: Payer: Self-pay | Admitting: Internal Medicine

## 2009-05-10 ENCOUNTER — Telehealth: Payer: Self-pay | Admitting: Family Medicine

## 2009-05-25 ENCOUNTER — Encounter: Payer: Self-pay | Admitting: *Deleted

## 2009-05-25 ENCOUNTER — Telehealth: Payer: Self-pay | Admitting: Family Medicine

## 2009-07-23 ENCOUNTER — Encounter: Payer: Self-pay | Admitting: Family Medicine

## 2009-08-24 ENCOUNTER — Encounter: Payer: Self-pay | Admitting: Family Medicine

## 2009-12-20 ENCOUNTER — Telehealth: Payer: Self-pay | Admitting: *Deleted

## 2010-01-11 ENCOUNTER — Ambulatory Visit (HOSPITAL_COMMUNITY): Admission: RE | Admit: 2010-01-11 | Discharge: 2010-01-11 | Payer: Self-pay | Admitting: Family Medicine

## 2010-01-11 ENCOUNTER — Ambulatory Visit: Payer: Self-pay | Admitting: Family Medicine

## 2010-01-11 DIAGNOSIS — R0789 Other chest pain: Secondary | ICD-10-CM | POA: Insufficient documentation

## 2010-01-11 DIAGNOSIS — G479 Sleep disorder, unspecified: Secondary | ICD-10-CM | POA: Insufficient documentation

## 2010-01-13 ENCOUNTER — Telehealth: Payer: Self-pay | Admitting: Family Medicine

## 2010-01-19 ENCOUNTER — Encounter: Payer: Self-pay | Admitting: Family Medicine

## 2010-01-25 ENCOUNTER — Telehealth: Payer: Self-pay | Admitting: *Deleted

## 2010-02-24 ENCOUNTER — Telehealth: Payer: Self-pay | Admitting: Family Medicine

## 2010-03-24 ENCOUNTER — Encounter: Admission: RE | Admit: 2010-03-24 | Discharge: 2010-03-24 | Payer: Self-pay | Admitting: Gastroenterology

## 2010-03-30 ENCOUNTER — Telehealth: Payer: Self-pay | Admitting: *Deleted

## 2010-03-31 ENCOUNTER — Encounter: Payer: Self-pay | Admitting: Family Medicine

## 2010-05-05 ENCOUNTER — Telehealth: Payer: Self-pay | Admitting: Family Medicine

## 2010-05-24 ENCOUNTER — Encounter: Payer: Self-pay | Admitting: Family Medicine

## 2010-05-24 ENCOUNTER — Ambulatory Visit: Payer: Self-pay | Admitting: Family Medicine

## 2010-05-24 DIAGNOSIS — M170 Bilateral primary osteoarthritis of knee: Secondary | ICD-10-CM | POA: Insufficient documentation

## 2010-05-24 LAB — CONVERTED CEMR LAB
ALT: 13 units/L (ref 0–35)
BUN: 16 mg/dL (ref 6–23)
CO2: 24 meq/L (ref 19–32)
Creatinine, Ser: 0.79 mg/dL (ref 0.40–1.20)
Eosinophils Absolute: 0.2 10*3/uL (ref 0.0–0.7)
Eosinophils Relative: 3 % (ref 0–5)
HCT: 33.6 % — ABNORMAL LOW (ref 36.0–46.0)
Hemoglobin: 10.9 g/dL — ABNORMAL LOW (ref 12.0–15.0)
Lymphs Abs: 2.4 10*3/uL (ref 0.7–4.0)
MCV: 91.8 fL (ref 78.0–100.0)
Monocytes Absolute: 0.6 10*3/uL (ref 0.1–1.0)
Monocytes Relative: 7 % (ref 3–12)
Platelets: 305 10*3/uL (ref 150–400)
Total Bilirubin: 0.3 mg/dL (ref 0.3–1.2)
WBC: 7.5 10*3/uL (ref 4.0–10.5)

## 2010-07-01 ENCOUNTER — Ambulatory Visit
Admission: RE | Admit: 2010-07-01 | Discharge: 2010-07-01 | Payer: Self-pay | Source: Home / Self Care | Attending: Family Medicine | Admitting: Family Medicine

## 2010-07-01 DIAGNOSIS — R059 Cough, unspecified: Secondary | ICD-10-CM | POA: Insufficient documentation

## 2010-07-01 DIAGNOSIS — R05 Cough: Secondary | ICD-10-CM | POA: Insufficient documentation

## 2010-07-01 DIAGNOSIS — L738 Other specified follicular disorders: Secondary | ICD-10-CM | POA: Insufficient documentation

## 2010-07-17 ENCOUNTER — Encounter: Payer: Self-pay | Admitting: Gastroenterology

## 2010-07-17 ENCOUNTER — Encounter: Payer: Self-pay | Admitting: Family Medicine

## 2010-07-17 ENCOUNTER — Encounter: Payer: Self-pay | Admitting: Internal Medicine

## 2010-07-20 ENCOUNTER — Encounter (INDEPENDENT_AMBULATORY_CARE_PROVIDER_SITE_OTHER): Payer: Self-pay | Admitting: *Deleted

## 2010-07-26 NOTE — Assessment & Plan Note (Signed)
Summary: meds refill,df   Vital Signs:  Patient profile:   70 year old female Height:      62 inches Weight:      159.9 pounds BMI:     29.35 O2 Sat:      96 % on Room air Temp:     97.8 degrees F oral Pulse rate:   76 / minute BP sitting:   155 / 89  (left arm) Cuff size:   regular  Vitals Entered By: [name] (January 11, 2010 1:49 PM) CC: med refill Is Patient Diabetic? No Pain Assessment Patient in pain? no        Primary Provider:  [facility]  CC:  med refill.  History of Present Illness: Pt. says that she is here to get her medications filled.  Also says she has been feeling tightness in her chest for the past 7 days, that is constant, and is worse with exertion.  She denies that it is pain, and says it feels different from when she had a heart attack.  She says she gets short of breath with walking up stairs, and sometimes feels dizzy, both of which improve with rest.  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  She says she has been sleeping more and thinks the work tired her out.  She says she feels like the chest tightness is bronchitis or PNA, and is wondering if we could get a CXR.  She denies fever/chills, diaphoresis, n/v, or constipation/diarrhea that is changed from her baseline irritable bowel syndrome.   [name] is also complaining of difficulty sleeping sometimes.  She says she worries a lot and her baseline pain keeps her awake.  She has had ambien in the past which has helped.     Habits & Providers  Alcohol-Tobacco-Diet     Tobacco Status: quit     Tobacco Counseling: to quit use of tobacco products     Year Quit: 1976  Allergies: 1)  ! Codeine 2)  ! Sulfa 3)  ! * Meloxicam 4)  ! Lipitor (Atorvastatin)  Family History: Reviewed history from 01/25/2009 and no changes required. Breast Cancer- mother Lynwood Dawley, died young Half-brother on  maternal side-healthy Mother 64 in [place] with Alzheimers Family History of Bladder Cancer: Maternal Grandmother No FH of Colon Cancer:  Social History: Reviewed history from 02/25/2008 and no changes required. Divorced . Living with daughter in [place] who has schizophrenia.; Gravida 8 Para 5 (Strained relationships with her children- youngest daughter with significant psych problems); Quit smoking 1996 / Quit Drinking 1994 (Attends AA). [facility]termitt[facility].Smoking Status:  quit  Review of Systems Resp:  Complains of chest discomfort and chest pain with inspiration; denies sputum productive. GI:  Complains of abdominal pain, change in bowel habits, constipation, and diarrhea. MS:  Complains of joint pain; Bilateral knee pain.  Physical Exam  General:  Well-developed,well-nourished,in no acute distress; alert,appropriate and cooperative throughout examination Head:  normocephalic.   Eyes:  vision grossly intact, pupils equal, pupils round, pupils reactive to light, and pupils react to accomodation.   Mouth:  Oral mucosa and oropharynx without lesions or exudates.  fair dentition.   Neck:  supple, full ROM, and no masses.   Chest Wall:  no deformities and chest wall tenderness.   Lungs:  Normal respiratory effort, chest expands symmetrically. Lungs are clear to auscultation, no crackles or wheezes.  Heart:  Normal rate and regular rhythm. S1 and S2 normal without gallop, murmur, click, rub or other extra sounds. Abdomen:  Diffuse, tenderness, soft, normal bowel sounds, and no distention.   Pulses:  R and L carotid,radial, dorsalis pedis and  are full and equal bilaterally   Impression & Recommendations:  Problem # 1:  CHEST DISCOMFORT (ICD-786.59) Pt believes this is lung related, and that she has bronchitis.  No wheezing or vital sign changes.  Rx for Doxycycline.  Advised pt. to see her cardiologist soon.  EKG is NSR.  Orders: EKG- [facility] (EKG) [facility]- Est  Level 4  ([address])  Problem # 2:  UNSPECIFIED SLEEP DISTURBANCE (ICD-780.50)  Pt. unclear as to how long this has been going on, but says it is a long time.  She has had ambien in the past.  Rx written for [employer].    Orders: [facility]- Est  Level 4 (60454)  Problem # 3:  ABDOMINAL PAIN -GENERALIZED (ICD-789.07)  Advised pt. to f/u with her GI specialist for abdominal pain, and back and forth constapation and diarrhea.    Orders: [facility]- Est  Level 4 (09811)  Complete Medication List: 1)  Lisinopril 20 Mg Tabs (Lisinopril) .... Take 1 tablet by mouth once a day 2)  Meloxicam 15 Mg Tabs (Meloxicam) .Marland Kitchen.. 1 tab by mouth daily 3)  Crestor 40 Mg Tabs (Rosuvastatin calcium) .Marland Kitchen.. 1 tab by mouth daily 4)  Singulair 10 Mg Tabs (Montelukast sodium) .Marland Kitchen.. 1 tablet po daily 5)  Cymbalta 20 Mg Cpep (Duloxetine hcl) .Marland Kitchen.. 1 tab by mouth bid 6)  Plavix 75 Mg Tabs (Clopidogrel bisulfate) .Marland Kitchen.. 1 tab by mouth daily 7)  Triamcinolone Acetonide 0.5 % Oint (Triamcinolone acetonide) .... Apply to affected area two times a day.  dispense 30 gm tube 8)  Robinul-forte 2 Mg Tabs (Glycopyrrolate) .... Take 1/2 tablet by mouth every 12 hours as needed 9)  Bystolic 5 Mg Tabs (Nebivolol hcl) .... Take 1/2 once daily 10)  Nexium 40 Mg Cpdr (Esomeprazole magnesium) .Marland Kitchen.. 1 tab by mouth daily 11)  Hydroxyzine Hcl 25 Mg Tabs (Hydroxyzine hcl) .... As needed 12)  Diphenoxylate-atropine 2.5-0.025 Mg Tabs (Diphenoxylate-atropine) .Marland Kitchen.. 1 tab by mouth as needed for diarrhea 13)  Triamterene-hctz 37.5-25 Mg Caps (Triamterene-hctz) .Marland Kitchen.. 1 tab by mouth daily 14)  Adult Aspirin Low Strength 81 Mg Tbdp (Aspirin) .Marland Kitchen.. 1 tab by mouth daily 15)  Flonase 50 Mcg/act Susp (Fluticasone propionate) .... Sig: use 2 sprays per nostril once dailyin the morning.  disp 1 cannister 16)  Bentyl 10 Mg Caps (Dicyclomine hcl) .... Take twice daily 17)  Zolpidem Tartrate 12.5 Mg Cr-tabs (Zolpidem tartrate) .... Take one by mouth at bedtime as needed for sleep 18)   Singulair 10 Mg Tabs (Montelukast sodium) .... Take one by mouth daily 19)  Doxycycline Hyclate 100 Mg Caps (Doxycycline hyclate) .... Take one by mouth daily for 5 days  Patient Instructions: 1)  Please see your cardiologist in the next few weeks.  Please avoid otherpeople smoking.  Please return in 3 months for a follow up appointment.  If chest pain becomes severe, please seek medical care.   Prescriptions: ZOLPIDEM TARTRATE 12.5 MG CR-TABS (ZOLPIDEM TARTRATE) take one by mouth at bedtime as needed for sleep  #15 x 3   Entered and Authorized by:   Ardyth Gal MD   Signed by:   Ardyth Gal MD on 01/11/2010   Method used:   Print then Give to Patient   RxID:   [id number]  DOXYCYCLINE HYCLATE 100 MG CAPS (DOXYCYCLINE HYCLATE) take one by mouth daily for 5 days  #5 x 0   Entered and Authorized by:   Ardyth Gal MD   Signed by:   Ardyth Gal MD on 01/11/2010   Method used:   Electronically to        [facility] [address] [phone]* (retail)       [address] [place], [place]  [address]       Ph: [phone]       Fax: [phone]   RxID:   [id number] NEXIUM 40 MG CPDR (ESOMEPRAZOLE MAGNESIUM) 1 tab by mouth daily  #30 x 5   Entered and Authorized by:   Ardyth Gal MD   Signed by:   Ardyth Gal MD on 01/11/2010   Method used:   Electronically to        [facility] [address] [phone]* (retail)       [address] [place], [place]  [address]       Ph: [phone]       Fax: [phone]   RxID:   [id number] BYSTOLIC 5 MG TABS (NEBIVOLOL HCL) Take 1/2 once daily  #30 x 3   Entered and Authorized by:   Ardyth Gal MD   Signed by:   Ardyth Gal MD on 01/11/2010   Method used:   Electronically to        [facility] [address] [phone]* (retail)       [address] [place], [place]  [address]       Ph: [phone]       Fax: [phone]   RxID:   [id number] PLAVIX 75 MG  TABS  (CLOPIDOGREL BISULFATE) 1 tab by mouth daily  #30 x 2   Entered and Authorized by:   Ardyth Gal MD   Signed by:   Ardyth Gal MD on 01/11/2010   Method used:   Electronically to        [facility] [address] [phone]* (retail)       [address] [place], [place]  [address]       Ph: [phone]       Fax: [phone]   RxID:   [id number] CYMBALTA 20 MG  CPEP (DULOXETINE HCL) 1 tab by mouth bid  #60 x 0   Entered and Authorized by:   Ardyth Gal MD   Signed by:   Ardyth Gal MD on 01/11/2010   Method used:   Electronically to        [facility] [address] [phone]* (retail)       [address] [place], [place]  [address]       Ph: [phone]       Fax: [phone]   RxID:   [id number] SINGULAIR 10 MG  TABS (MONTELUKAST SODIUM) 1 tablet po daily  #30 x 5   Entered and Authorized by:   Ardyth Gal MD   Signed by:   Ardyth Gal MD on 01/11/2010   Method used:   Electronically to        [facility] [address] [phone]* (retail)       [address] [place], [place]  [address]       Ph: [phone]       Fax: [phone]  RxID:   [id number] CRESTOR 40 MG TABS (ROSUVASTATIN CALCIUM) 1 tab by mouth daily  #30 x 11   Entered and Authorized by:   Ardyth Gal MD   Signed by:   Ardyth Gal MD on 01/11/2010   Method used:   Electronically to        [facility] [address] [phone]* (retail)       [address] [place], [place]  [address]       Ph: [phone]       Fax: [phone]   RxID:   [id number] MELOXICAM 15 MG TABS (MELOXICAM) 1 tab by mouth daily  #30 x 6   Entered and Authorized by:   Ardyth Gal MD   Signed by:   Ardyth Gal MD on 01/11/2010   Method used:   Electronically to        [facility] [address] [phone]* (retail)       [address] [place], [place]  [address]       Ph: [phone]       Fax: [phone]   RxID:    [id number] ZOLPIDEM TARTRATE 12.5 MG CR-TABS (ZOLPIDEM TARTRATE) take one by mouth at bedtime as needed for sleep  #0 x 0   Entered and Authorized by:   Ardyth Gal MD   Signed by:   Ardyth Gal MD on 01/11/2010   Method used:   Print then Give to Patient   RxID:   [id number]

## 2010-07-26 NOTE — Progress Notes (Signed)
Summary: refill  Medications Added CYMBALTA 20 MG  CPEP (DULOXETINE HCL) 1 tab by mouth bid       Phone Note Refill Request Call back at [phone] Message from:  Patient  Refills Requested: Medication #1:  CYMBALTA 20 MG  CPEP 1 tab by mouth bid [facility] [place] pt has an appt and needs enough to last until the 19th  Next Appointment Scheduled: 01/11/10 Initial call taken by: De Nurse,  December 20, 2009 2:55 PM  Follow-up for Phone Call        Dr Constance Goltz Rx'd via erx earlier this month Follow-up by: Pearlean Brownie MD,  December 20, 2009 3:57 PM  Additional Follow-up for Phone Call Additional follow up Details #1::        They were denied on both the 17 and 26. Additional Follow-up by: Jone Baseman CMA,  December 20, 2009 4:40 PM    Additional Follow-up for Phone Call Additional follow up Details #2::    Oops didn't scroll down far enough.  PCP denied related needed and appointment.  She therefore needs to come in for an appointment before any refills Follow-up by: Pearlean Brownie MD,  December 21, 2009 9:16 AM  Additional Follow-up for Phone Call Additional follow up Details #3:: Details for Additional Follow-up Action Taken: Can we give her enough to last till her appt on the 19th? Additional Follow-up by: Jone Baseman CMA,  December 21, 2009 9:32 AM  New/Updated Medications: CYMBALTA 20 MG  CPEP (DULOXETINE HCL) 1 tab by mouth bid Prescriptions: CYMBALTA 20 MG  CPEP (DULOXETINE HCL) 1 tab by mouth bid  #60 x 0   Entered and Authorized by:   Pearlean Brownie MD   Signed by:   Pearlean Brownie MD on 12/21/2009   Method used:   Telephoned to ...       [facility] [address] [phone]* (retail)       [address] [place], [place]  [address]       Ph: [phone]       Fax: [phone]   RxID:   [id number]   Yes please call in the above thanks  Pearlean Brownie MD  December 21, 2009 3:4 Called in as above. Pt informed  ............................................... Delora Fuel December 21, 2009 4:22 PM

## 2010-07-26 NOTE — Consult Note (Signed)
Summary: [facility]  [facility]   Imported By: Clydell Hakim 04/07/2010 11:52:02  _____________________________________________________________________  External Attachment:    Type:   Image     Comment:   External Document
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
## 2010-07-26 NOTE — Progress Notes (Signed)
Summary: meds prob   Phone Note Call from Patient Call back at [phone]   Caller: Patient Summary of Call: Need refill on Cymbalta.  Did not receive along with other refills at last appt.  Taken 20 mg two times a day.  Call when sent Initial call taken by: Britta Mccreedy mcgregor  Follow-up for Phone Call        to Dr. Wallene Huh as he is on [facility] team. pt has been seen within the past 3 months Follow-up by: Golden Circle RN,  February 25, 2010 11:52 AM  Additional Follow-up for Phone Call Additional follow up Details #1::        Refilled by Dr. Jeanice Lim. Additional Follow-up by: Bobby Rumpf  MD,  February 26, 2010 6:19 AM

## 2010-07-26 NOTE — Progress Notes (Signed)
Summary: phn msg   Phone Note Call from Patient Call back at [phone]   Caller: Patient Summary of Call: needs to talk to someone about Mobic - can't take generic - and insurance states that it needs prior auth from insurance to be able to continue to pay for the Mobic Initial call taken by: De Nurse,  May 05, 2010 1:34 PM  Follow-up for Phone Call        patient states she has a letter from [employer] and she will bring to me tomorrow and I will try to send in PA request  for the Mobic.   advised patient to schedule appointment to follow up with MD . Follow-up by: Theresia Lo RN,  May 05, 2010 5:20 PM  Additional Follow-up for Phone Call Additional follow up Details #1::        Agree patient needs to be seen before can fill out authorization etc.  Saw Dr Ladoris Gene before she should see her again thanks [facility] Additional Follow-up by: Pearlean Brownie MD,  May 06, 2010 10:05 AM

## 2010-07-26 NOTE — Progress Notes (Signed)
Summary: refill   Phone Note Refill Request Call back at [phone] Message from:  Patient  Refills Requested: Medication #1:  DIPHENOXYLATE-ATROPINE 2.5-0.025 MG TABS 1 tab by mouth as needed for diarrhea Initial call taken by: De Nurse,  January 25, 2010 8:40 AM  Follow-up for Phone Call        Dr Delia Chimes office has prescribed this and has been refilling it---she shuld call them. Thanks!  Denny Levy MD  January 25, 2010 10:23 AM   Additional Follow-up for Phone Call Additional follow up Details #1::        Left message on pt vm informing of above. Additional Follow-up by: Garen Grams LPN,  January 25, 2010 10:38 AM

## 2010-07-26 NOTE — Progress Notes (Signed)
   Phone Note Outgoing Call Call back at [phone]   Summary of Call: Pt called emergency line and was requesting prior authorization for her Mobic .  She states that her hand has been hurting from her arthritis and that she is out of her Mobic .  She then talked for approx 15 minutes about how she had been requesting prior auth for the mobic  and that she hadn't received it yet.  She wanted me to give her Dr. Adine personal number, when I wouldn't give this number, she asked for me to call the insurance company now and get prior authorization.  I informed her that the office notes state that the form had been completed and that it was probably processing.  In the meantime I recommended OTC Ibuprofen 600mg  by mouth q6h as needed pain.  She was very happy with this recommendation and agrees to take it.  She will also wait until normal office hours to call the office to inquire further about her prior auth. Initial call taken by: Debby Petties MD,  January 02, 2009 9:20 AM

## 2010-07-26 NOTE — Progress Notes (Signed)
Summary: lipitor and Mobic rx sent  Medications Added LIPITOR 40 MG TABS (ATORVASTATIN CALCIUM) 1 by mouth at bedtime for cholesterol MOBIC 15 MG TABS (MELOXICAM) 1 by mouth daily for pain [BMN]       Phone Note Outgoing Call   Call placed to: Patient Summary of Call: Discussed meds with pt and reviewed chart.  Pt had questions about 3 meds 1) requesting brand Mobic; generic gives her diarrhea.  chart shows repeated letters and faxes to insurance company for prior auth for this.  Pt now says she also has [facility] and they cover the brand for her.  Rx e-sent to [facility] for brand Mobic.  2) Reports the reaction to a statin was to Crestor, not Lipitor.  Records reveal that she was placed on Lipitor by cards during hospitalization 09/29/08.  Pt denies anyadverse reaction to Lipitor.  Pt states she has not taken a statin for "a long time"  and that she has a full bottle of Crestor that she won't try because she had a really bad reaction to it  in the past.  (Unsure what that reaction was).  Rx for Lipitor sent to [facility]. 3) Pt reports Dr. Alanda Amass decreased her lisinopril from 20 to 10 mg after some stenting was done.  She acknowledges that her BP was elevated when she saw Dr. Lula Olszewski, so she agrees that 20 mg would be appropriate now.  She will stay on 20 mg and is trying to schedule an appt with Cardiology for further f/u.   Med list and allergies updated.   Initial call taken by: Pamalee Marcoe [place] MD,  January 13, 2010 10:52 AM   New Allergies: ! * MELOXICAM ! CRESTOR (ROSUVASTATIN CALCIUM) New/Updated Medications: LIPITOR 40 MG TABS (ATORVASTATIN CALCIUM) 1 by mouth at bedtime for cholesterol MOBIC 15 MG TABS (MELOXICAM) 1 by mouth daily for pain [BMN] New Allergies: ! * MELOXICAM ! CRESTOR (ROSUVASTATIN CALCIUM)Prescriptions: MOBIC 15 MG TABS (MELOXICAM) 1 by mouth daily for pain Brand medically necessary #90 x 0   Entered and Authorized by:   Aleena Kirkeby [place] MD   Signed by:    Joram Venson [place] MD on 01/13/2010   Method used:   Electronically to        [facility] [address] [phone]* (retail)       [address] [place], [place]  [address]       Ph: [phone]       Fax: [phone]   RxID:   [id number] LIPITOR 40 MG TABS (ATORVASTATIN CALCIUM) 1 by mouth at bedtime for cholesterol  #90 x 0   Entered and Authorized by:   Elfa Wooton [place] MD   Signed by:   Klaryssa Fauth [place] MD on 01/13/2010   Method used:   Electronically to        [facility] [address] [phone]* (retail)       [address] [place], [place]  [address]       Ph: [phone]       Fax: [phone]   RxID:   [id number]

## 2010-07-26 NOTE — Progress Notes (Signed)
Summary: Medication changes   Phone Note Call from Patient Call back at [phone]   Reason for Call: Talk to Nurse Summary of Call: pt sts her meds are wrong, needs brand name mobic, also given crestor instead of lipitor, would like to discuss with RN Initial call taken by: Knox Royalty,  January 13, 2010 9:46 AM  Follow-up for Phone Call        LM Follow-up by: Golden Circle RN,  January 13, 2010 9:56 AM  Additional Follow-up for Phone Call Additional follow up Details #1::        states she can only take name brand mobic or she gets diarrhea. states she can only take lipitor per her cardiac md. states other statins give her problems. does not know what the side effects were but does not want to take them. she wanted to know if she has to take the lisinopril 20 as it used to be 10 per her cardiac md. told her pressure was up & md felt 20 was best for now  explained that we do not know what her cardiac md gives her & what changes he/she makes. she states she is going to call for an appt.  she had no further issues. wants the 2 changes made today as she is out of meds. uses [facility] on [address] Additional Follow-up by: Golden Circle RN,  January 13, 2010 10:05 AM    Additional Follow-up for Phone Call Additional follow up Details #2::    Discussed with pt.  See next note. Follow-up by: Sarah [place] MD,  January 13, 2010 10:53 AM

## 2010-07-26 NOTE — Progress Notes (Signed)
   Phone Note Refill Request Call back at [phone]   Pt is requesting rx for Celebrex.  Want to also have more that 30 day supply with refills for a year.  Send to [facility] on [address]  Initial call taken by: Abundio Miu,  March 30, 2010 11:05 AM  Follow-up for Phone Call        last visit was july. usually see pts with htn every 3-4 months. to Dr. Swaziland to see if ok to fill as pt has requested Follow-up by: Golden Circle RN,  March 30, 2010 11:50 AM  Additional Follow-up for Phone Call Additional follow up Details #1::        Pt already on Mobic.  If this is not controlling her pain, she should probably be seen.  Please have her schedule a visit with the blue team, if possible. Additional Follow-up by: Sarah Swaziland MD,  March 30, 2010 12:18 PM    Additional Follow-up for Phone Call Additional follow up Details #2::    LM  Follow-up by: Golden Circle RN,  March 30, 2010 1:44 PM  Additional Follow-up for Phone Call Additional follow up Details #3:: Details for Additional Follow-up Action Taken: states she wants the cymbalta not the celbrex. states she is seeing an ortho . states she needs knee replacements. sees Dr. Kelly Splinter in [place] (ortho) does not want to change family practices. states she lives here but [place] to beach often.  wants meds 1 yr at a time. told her we like to see people with htn more often than that Additional Follow-up by: Golden Circle RN,  March 30, 2010 1:50 PM  Prescriptions: CYMBALTA 20 MG  CPEP (DULOXETINE HCL) 1 tab by mouth bid  #60 x 1   Entered by:   Golden Circle RN   Authorized by:   Milinda Antis MD   Signed by:   Golden Circle RN on 03/30/2010   Method used:   Electronically to        [facility]. [phone]* (retail)       [address] [place], [place]  [address]       Ph: [phone]       Fax: [phone]   RxID:   [id number]

## 2010-07-26 NOTE — Miscellaneous (Signed)
Summary: Removing old acute medications.   Clinical Lists Changes  Medications: Removed medication of DARVOCET-N 100 100-650 MG TABS (PROPOXYPHENE N-APAP) 1 tab by mouth Q6 as needed pain Removed medication of FLAGYL 500 MG TABS (METRONIDAZOLE) Take 1 taqb 3 times daily x 10 days

## 2010-07-26 NOTE — Progress Notes (Signed)
Summary: Medication   Phone Note Refill Request   Refills Requested: Medication #1:  DIPHENOXYLATE-ATROPINE 2.5-0.025 MG TABS 1 tab by mouth as needed for diarrhea Caller: Patient Summary of Call: Patient  Follow-up for Phone Call        Patient states she has been discharged from Dr Delia Chimes office and was just seen by Dr. Lula Olszewski recently and says she was supposed to be refilling this medication for her. Message to MD Follow-up by: Garen Grams LPN,  January 25, 2010 10:45 AM  Additional Follow-up for Phone Call Additional follow up Details #1::        [facility]-  per Dr Ian Bushman note Ms Bohman was to continue to follow with GI--so I am confused. Please refill as below-Please call it in--one time only--and have her make a f/u with Dr Shade Flood so they can clarify this Thanks!  Denny Levy MD  January 25, 2010 10:54 AM     Additional Follow-up for Phone Call Additional follow up Details #2::    Patient informed that rx has been called in, states she in process of finding a new gastroenterologist.  Follow-up by: Garen Grams LPN,  January 25, 2010 11:10 AM  Prescriptions: DIPHENOXYLATE-ATROPINE 2.5-0.025 MG TABS (DIPHENOXYLATE-ATROPINE) 1 tab by mouth as needed for diarrhea  #10 x 6   Entered and Authorized by:   Denny Levy MD   Signed by:   Denny Levy MD on 01/25/2010   Method used:   Telephoned to ...       [facility] [address] [phone]* (retail)       [address] [place], [place]  [address]       Ph: [phone]       Fax: [phone]   RxID:   [id number]

## 2010-07-28 NOTE — Letter (Signed)
Summary: Generic Letter  [facility]  [address]   [place], [place] [address]   Phone: [phone]  Fax: [phone]    07/20/2010  [address] [place], [place]  [address]  Dear Ms. Deguia,  We are happy to let you know that since you are covered under Medicare you are able to have a FREE visit at the [facility] to discuss your HEALTH. This is a new benefit for Medicare.  There will be no co-payment.  At this visit you will meet with Arlys John an expert in wellness and the health coach at our clinic.  At this visit we will discuss ways to keep you healthy and feeling well.  This visit will not replace your regular doctor visit and we cannot refill medications.     You will need to plan to be here at least one hour to talk about your medical history, your current status, review all of your medications, and discuss your future plans for your health.  This information will be entered into your record for your doctor to have and review.  If you are interested in staying healthy, this type of visit can help.  Please call the office at: [phone], to schedule a "Medicare Wellness Visit".  The day of the visit you should bring in all of your medications, including any vitamins, herbs, over the counter products you take.  Make a list of all the other doctors that you see, so we know who they are. If you have any other health documents please bring them.  We look forward to helping you stay healthy.  Sincerely,   [facility]  iAWV

## 2010-07-28 NOTE — Assessment & Plan Note (Signed)
Summary: congested/green phleghm/blue team/bmc   Vital Signs:  Patient profile:   70 year old female Height:      62 inches Weight:      161.1 pounds BMI:     29.57 Temp:     97.7 degrees F oral Pulse rate:   80 / minute BP sitting:   146 / 73  (right arm) Cuff size:   regular  Vitals Entered By: Garen Grams LPN (July 01, 2010 11:53 AM) CC: chest congestion x 2 week Is Patient Diabetic? No Pain Assessment Patient in pain? no        Primary Care Provider:  Alvia Grove  CC:  chest congestion x 2 week.  History of Present Illness: 1) Cough: x 2 weeks. Worsening. Green phlegm. +nasal congestion. No sick contact. H/o tobacco use (quit 1996). No diagnosis of COPD. Denies chronic cough.   2) Dry skin: Reports dry itchy skin every winter including currently. Located on abdomen and back. Usually relieved by topical corticosteroids and emollients.   Denies lethargy, dyspnea, chest pain, nausea, vomiting or diarrhea, abdominal pain, fever, sinus pain or pressure.     Habits & Providers  Alcohol-Tobacco-Diet     Tobacco Status: quit     Tobacco Counseling: to quit use of tobacco products     Year Quit: 1976     Passive Smoke Exposure: no  Allergies: 1)  ! Codeine 2)  ! Sulfa 3)  ! * Meloxicam 4)  ! Crestor (Rosuvastatin Calcium)  Social History: Reviewed history from 05/24/2010 and no changes required. Divorced.  Lives in [place] and [place]. Spends equal amount of time in each location.  (Strained relationships with her children- youngest daughter with significant psych problems); Quit smoking 1996 / Quit Drinking 1994 (Attends AA). Still cleans houses locally.   Physical Exam  General:  overweight, vitals reviewed, NAD  Head:  no sinus tenderness  Eyes:  no conjunctivitis  Nose:  mild congestion  Mouth:  moist membranes, no erythema or exudate  Neck:  no lymphadenopathy   Lungs:  Normal respiratory effort, chest expands symmetrically. Lungs are clear to  auscultation, no crackles or wheezes. Occasional productive cough with green sputum.  Heart:  Normal rate and regular rhythm. S1 and S2 normal without gallop, murmur, click, rub or other extra sounds. Skin:  turgor normal eczematous rash on abdomen and back    Impression & Recommendations:  Problem # 1:  COUGH (ICD-786.2) Assessment New  Given symptom duration and worsening symptoms, will treat as bacterial bronchitis with doxycycline as below. Other symptomatic management discussed. Red flags that would prompt return to care were reviewed with patient and patient expressed understanding. No frank diagnosis of COPD (though +ve smoking history). No reports of dyspnea or chronic cough - will not treat as COPD exacerbation with steroids for now - though would consider PFTs when well for baseline function.   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 Will treat with triamcinolone as below. Advised regarding emollient use especially during winter.   Orders: [facility]- Est  Level 4 (60454)  Complete Medication List: 1)  Lisinopril 20 Mg Tabs (Lisinopril) .... Take 1 tablet by mouth once a day 2)  Singulair 10 Mg Tabs (Montelukast sodium) .Marland Kitchen.. 1 tablet po daily 3)  Cymbalta 20 Mg Cpep (Duloxetine hcl) .Marland Kitchen.. 1 tab by mouth bid 4)  Plavix 75 Mg Tabs (Clopidogrel bisulfate) .Marland Kitchen.. 1 tab by mouth  daily 5)  Triamcinolone Acetonide 0.5 % Oint (Triamcinolone acetonide) .... Apply to affected area two times a day.  dispense 30 gm tube 6)  Robinul-forte 2 Mg Tabs (Glycopyrrolate) .... Take 1/2 tablet by mouth every 12 hours as needed 7)  Bystolic 5 Mg Tabs (Nebivolol hcl) .... Take 1/2 once daily 8)  Nexium 40 Mg Cpdr (Esomeprazole magnesium) .Marland Kitchen.. 1 tab by mouth daily 9)  Hydroxyzine Hcl 25 Mg Tabs (Hydroxyzine hcl) .Marland Kitchen.. 1 pill po as needed every 8hours 10)  Diphenoxylate-atropine 2.5-0.025 Mg Tabs (Diphenoxylate-atropine)  .Marland Kitchen.. 1 tab by mouth as needed for diarrhea 11)  Triamterene-hctz 37.5-25 Mg Caps (Triamterene-hctz) .Marland Kitchen.. 1 tab by mouth daily 12)  Flonase 50 Mcg/act Susp (Fluticasone propionate) .... Sig: use 2 sprays per nostril once dailyin the morning.  disp 1 cannister 13)  Bentyl 10 Mg Caps (Dicyclomine hcl) .... Take twice daily 14)  Zolpidem Tartrate 12.5 Mg Cr-tabs (Zolpidem tartrate) .... Take one by mouth at bedtime as needed for sleep 15)  Singulair 10 Mg Tabs (Montelukast sodium) .... Take one by mouth daily 16)  Doxycycline Hyclate 100 Mg Caps (Doxycycline hyclate) .... One tab by mouth two times a day x 7 days 17)  Lipitor 40 Mg Tabs (Atorvastatin calcium) .Marland Kitchen.. 1 by mouth at bedtime for cholesterol 18)  Mobic 15 Mg Tabs (Meloxicam) .Marland Kitchen.. 1 by mouth daily for pain 19)  Folic Acid 1 Mg Tabs (Folic acid) .Marland Kitchen.. 1 pill by mouth two times a day  Patient Instructions: 1)  If not improving come back in in one week 2)  Use triamcinolone cream to help with itchy skin 3)  Use Eucerin for moisturizing your skin  Prescriptions: DOXYCYCLINE HYCLATE 100 MG CAPS (DOXYCYCLINE HYCLATE) one tab by mouth two times a day x 7 days  #14 x 0   Entered and Authorized by:   Bobby Rumpf  MD   Signed by:   Bobby Rumpf  MD on 07/01/2010   Method used:   Electronically to        [facility] [address] [phone]* (retail)       [address] [place], [place]  [address]       Ph: [phone]       Fax: [phone]   RxID:   [id number] TRIAMCINOLONE ACETONIDE 0.5 %  OINT (TRIAMCINOLONE ACETONIDE) apply to affected area two times a day.  Dispense 30 gm tube  #1 x 1   Entered and Authorized by:   Bobby Rumpf  MD   Signed by:   Bobby Rumpf  MD on 07/01/2010   Method used:   Electronically to        [facility] [address] [phone]* (retail)       [address] [place], [place]  [address]       Ph: [phone]       Fax: [phone]   RxID:   [id number]    Orders Added: 1)  [facility]- Est   Level 4 [706[address]]

## 2010-08-09 ENCOUNTER — Ambulatory Visit: Payer: Self-pay | Admitting: Family Medicine

## 2010-08-24 ENCOUNTER — Ambulatory Visit: Payer: Self-pay | Admitting: Family Medicine

## 2010-09-19 ENCOUNTER — Ambulatory Visit: Payer: Self-pay | Admitting: Family Medicine

## 2010-09-20 ENCOUNTER — Other Ambulatory Visit: Payer: Self-pay | Admitting: Family Medicine

## 2010-09-20 MED ORDER — DULOXETINE HCL 20 MG PO CPEP: 20.0000 mg | ORAL_CAPSULE | Freq: Two times a day (BID) | ORAL | Status: DC

## 2010-09-30 ENCOUNTER — Telehealth: Payer: Self-pay | Admitting: Family Medicine

## 2010-09-30 MED ORDER — DIPHENOXYLATE-ATROPINE 2.5-0.025 MG PO TABS: 1.0000 | ORAL_TABLET | Freq: Once | ORAL | Status: DC

## 2010-09-30 NOTE — Telephone Encounter (Signed)
Open in error

## 2010-09-30 NOTE — Telephone Encounter (Signed)
Has diarrhea for 3 days. Has had hemicolectomy and has occasional diarrhea. Has some abdominal pain but that is normal for her. Has been lomotil which worked some. She ran out of lomotil.  Have been keeping fluids down but feels weak.  Check BP yesterday was 140/70 - 112/60s.  My advice is to hold lisinopril for 3 days, and take gatoraid.  Refilled lomotil Rx.  See in clinic on Monday. Red flags reviewed. Patient voices understanding.

## 2010-10-05 LAB — CBC
Platelets: 256 10*3/uL (ref 150–400)
RDW: 13.8 % (ref 11.5–15.5)
WBC: 8.7 10*3/uL (ref 4.0–10.5)

## 2010-10-05 LAB — BASIC METABOLIC PANEL
BUN: 14 mg/dL (ref 6–23)
Calcium: 9.3 mg/dL (ref 8.4–10.5)
GFR calc non Af Amer: >60 mL/min (ref >60)
Glucose, Bld: 100 mg/dL — ABNORMAL HIGH (ref 70–99)
Sodium: 134 mEq/L — ABNORMAL LOW (ref 135–145)

## 2010-10-14 ENCOUNTER — Encounter: Payer: Self-pay | Admitting: Family Medicine

## 2010-10-14 ENCOUNTER — Ambulatory Visit (INDEPENDENT_AMBULATORY_CARE_PROVIDER_SITE_OTHER): Payer: Medicare Other | Admitting: Family Medicine

## 2010-10-14 VITALS — BP 164/77 | HR 78 | Temp 98.6°F | Wt 166.2 lb

## 2010-10-14 DIAGNOSIS — J309 Allergic rhinitis, unspecified: Secondary | ICD-10-CM

## 2010-10-14 DIAGNOSIS — I10 Essential (primary) hypertension: Secondary | ICD-10-CM

## 2010-10-14 DIAGNOSIS — L738 Other specified follicular disorders: Secondary | ICD-10-CM

## 2010-10-14 DIAGNOSIS — M25569 Pain in unspecified knee: Secondary | ICD-10-CM

## 2010-10-14 MED ORDER — FLUTICASONE PROPIONATE 50 MCG/ACT NA SUSP: 2.0000 | NASAL | Status: DC

## 2010-10-14 NOTE — Patient Instructions (Signed)
We will send you the info for your dermatology appt We will send in the referral for rheumatology Go get your mammogram Come back for a pap smear

## 2010-10-14 NOTE — Progress Notes (Signed)
  Subjective:    Patient ID: [name], female    DOB: 05/11/1941, 70 y.o.   MRN: [id number]  HPI Here to discuss medication and side effects.  Has stopped lisinopril and has called cards to tell them but missed her appt this am.  She says that fatigue was the side effect.  Also she says she wants to be off all NSAIDs and steroids, including topical.  However her eczema is still bothering her.     Review of Systems Denies CP, SOB    Objective:   Physical Exam Vital signs reviewed General appearance - alert, well appearing, and in no distress and oriented to person, place, and time Heart - normal rate, regular rhythm, normal S1, S2, no murmurs, rubs, clicks or gallops Chest - clear to auscultation, no wheezes, rales or rhonchi, symmetric air entry, no tachypnea, retractions or cyanosis Skin- ezcematous rash on forearms and on right flank, excoriations on right flank       Assessment & Plan:

## 2010-10-16 ENCOUNTER — Encounter: Payer: Self-pay | Admitting: Family Medicine

## 2010-10-16 NOTE — Assessment & Plan Note (Signed)
Stopped her lisinopril and has called her cardiologist about this.  She thinks this might be contributing to her fatigue.  She missed her cards appt this AM but I recommended that she she cards ASAP as they are managing these meds for her and may have a strong preference which one she continues.

## 2010-10-16 NOTE — Assessment & Plan Note (Signed)
Chronic eczema on truck and arms.  Pt does not want to be on anything with a steriod in it, even topical.  Will refer to derm for help with management.

## 2010-10-16 NOTE — Assessment & Plan Note (Signed)
Has seen Dr. Titus Dubin who increased her tramadol.  She has been on prednisone on and off for years and reports that rheumatology is considering a gout diagnosis.  She wants to stop her mobic so has gone to half the tab q day.  Pain increased off mobic.  Rheumatology to follow.

## 2010-10-21 ENCOUNTER — Other Ambulatory Visit: Payer: Self-pay | Admitting: Family Medicine

## 2010-10-21 NOTE — Telephone Encounter (Signed)
Refill request

## 2010-11-03 ENCOUNTER — Telehealth: Payer: Self-pay | Admitting: Family Medicine

## 2010-11-03 NOTE — Telephone Encounter (Signed)
Advise to hold lisinopril if diarrhea is bad.  Come see [place] or her GI if not improving, needs abx.

## 2010-11-03 NOTE — Telephone Encounter (Signed)
Is having another bout of diarrhea and needs Flagyl called in to [facility] - [phone]

## 2010-11-08 NOTE — Discharge Summary (Signed)
NAME:  [name], [name] NO.:  [id number]   MEDICAL RECORD NO.:  [id number]          PATIENT TYPE:  INP   LOCATION:  2506                         FACILITY:  [facility]   PHYSICIAN:  Cristy Hilts. Jacinto Halim, MD       DATE OF BIRTH:  1940-09-02   DATE OF ADMISSION:  09/28/2008  DATE OF DISCHARGE:  09/29/2008                               DISCHARGE SUMMARY   DISCHARGE DIAGNOSES:  1. Renovascular disease.      a.     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 Yates Decamp.  2. 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 Jacinto Halim.  3. Hypertension controlled.  4. Anemia.  We will add iron.  5. Recent rash and pruritus, currently improved after shot of steroids      as an outpatient.  6. Mild hematoma after catheterization currently stable.   DISCHARGE CONDITION:  Improved.   PROCEDURES:  September 28, 2008 percutaneous transluminal angioplasty and  stent to the right renal artery by Dr. Yates Decamp.   September 28, 2008, percutaneous transluminal angioplasty and stent  deployment to the left subclavian artery by Dr. Jacinto Halim.   DISCHARGE MEDICATIONS:  1. Lisinopril with decreased dose from 20-10 mg daily.  2. Lipitor 40 mg daily.  Please note the patient had been on Crestor,      Zocor was tried but she did not tolerate the Zocor so she is on      Lipitor 40 mg daily which she had tolerated in the past.  3. Nexium 40 mg daily.  4. Flagyl 250 mg as needed three times a day.  5. Cymbalta 20 mg twice a day.  6. Singulair 10 mg daily.  7. Bystolic 2.5 mg b.[name].d.  8. Triamterene and hydrochlorothiazide 37.5/25 daily.  9. Zyrtec 10 mg daily.  10.Hydroxyzine 10 mg at bedtime.  11.Dicyclomine HCl 20 mg twice a day as needed.  12.Mobic 15 mg daily.  13.Increase aspirin to two 81 mg aspirin daily.  14.Stop  doxycycline 100 mg.  The patient had stopped this as an      outpatient.  15.Nitroglycerin 150 sublingual as needed for chest pain as before.  16.Nu-Iron 150 mg one twice a day.  17.Folic acid 1 mg twice a day.  18.Fish oil 1000 mg daily.  19.Plavix 75 mg daily.  20.Darvocet-N 100 as needed for pain as previously taken.   DISCHARGE INSTRUCTIONS:  1. Low-sodium heart-healthy diet.  2. Wash cath site with soap and water.  Call if any bleeding, swelling      or drainage.  3. Increase activity slowly.  4. May shower bathe.  5. No lifting for 2 days.  6. No driving for 2 days.  7. Follow up with Dr. Alanda Amass in the office will call with date and      time.  8. You are scheduled for renal Dopplers and  upper extremity Dopplers      on October 21, 2008 at 9:30 a.m..   HISTORY OF PRESENT ILLNESS:  The patient presented to short stay with  plans for PTA and stents to the renal artery stenosis of the right as  well as left subclavian stenosis that was 70-80% and the patient was  symptomatic with claudication and dizziness.  She underwent the  procedures without complications.  She did bleed somewhat from the right  groin cath site the day after the procedure but by the next morning she  was stable and ready for discharge home.  She ambulated without  problems.  She will follow up as an outpatient with Dr. Alanda Amass and  followup Dopplers as an outpatient.  Her hemoglobin prior to discharge  was slightly lower, she had been 11 as an outpatient, at discharge she  was 10, hematocrit of 31.7, WBC 8.7, platelets 256, MCV 94.  Chemistry  sodium 134, potassium 4.9, chloride 103, CO2 26, BUN 14, creatinine  0.74, glucose 100, and calcium 93.   VITAL SIGNS AT DISCHARGE:  Blood pressure was 133/69 later in the  morning it was 106, pulse 63, respirations 14, temperature 98.5, oxygen  saturation on room air 97%.   Dr. Jacinto Halim and Dr. Alanda Amass saw her prior to discharge.      Darcella Gasman. [place],  N.P.      Cristy Hilts. Jacinto Halim, MD  Electronically Signed    LRI/MEDQ  D:  09/29/2008  T:  09/30/2008  Job:  [id number]   cc:   Gerlene Burdock A. Alanda Amass, M.D.

## 2010-11-08 NOTE — Cardiovascular Report (Signed)
NAMEJENNETT, [name]                  ACCOUNT NO.:  [id number]   MEDICAL RECORD NO.:  [id number]          PATIENT TYPE:  AMB   LOCATION:  SDS                          FACILITY:  [facility]   PHYSICIAN:  Vonna Kotyk R. Jacinto Halim, MD       DATE OF BIRTH:  May 01, 1941   DATE OF PROCEDURE:  09/28/2008  DATE OF DISCHARGE:                            CARDIAC CATHETERIZATION   PROCEDURES PERFORMED:  1. Percutaneous transluminal angioplasty and stenting of the left      subclavian artery.  2. Percutaneous transluminal angioplasty and stenting of the right      renal artery.   INDICATIONS:  [name] is a 70 year old female with known  peripheral arterial disease.  She had undergone cardiac catheterization  and also peripheral angiography of the left subclavian artery and also  renal artery.  The indications were left arm claudication and also  dizziness and vertebral steal.  The renovascular hypertension.  She was  found to have a high-grade stenosis of left subclavian artery.  She was  also found to have a 70% stenosis of right renal artery.  Because of  uncontrolled hypertension and because of symptoms of claudication of her  left upper extremity PTA was contemplated and the patient was brought to  the [facility] for elective angioplasty.   ANGIOGRAPHIC DATA:  Left subclavian artery:  Left subclavian artery  showed a napkin ring-like 70-80% stenoses.  There was a 20-25 mm  pressure gradient across the left subclavian artery.   INTERVENTION DATA:  Successful PTA and stenting of the left subclavian  artery with implantation of a 10.0 x 20-mm self-expanding stent.  This  stent was postdilated with a 7.0 x 20 mm  FoxCross balloon.  Overall,  the stenosis was reduced from 70-80% to 0% with no residual pressure  gradient across the stenoses.  Vertebral, distal subclavian, and the  left internal mammary artery flow was maintained at the end of the  procedure.   Successful PTA  and direct stenting of the right femoral artery with  implantation of a 5.0 x 12-mm Herculink stent, which was deployed at 10  atmospheric pressure and postdilated at 14 atmospheric pressure.  Overall, the stenosis reduced from 70% to 0% with 20-25 mm pressure  gradient to 0 mm pressure gradient at the end of the procedure.   RECOMMENDATIONS:  The patient has had excellent results with self-  expanding stent implantation of the subclavian artery and also balloon  expandable stent implantation of the right renal artery covering the  ostium.  Excellent results were noted.  The patient will be discharged  home in the morning with a close watch kept on her blood pressure.   A total of 135 mL of contrast was utilized for diagnostic and  interventional procedure.   TECHNIQUE OF PROCEDURE:  Under usual sterile precautions using a 6-  French right femoral arterial access, a 5-French JB1 catheter was  utilized to engage the left subclavian artery and angiography was  performed.  Careful pullback was also performed and pressure gradient  across the  left subclavian artery was noted.  Then using heparin for  anticoagulation maintaining ACT greater than 200 seconds.  A stiff J-  tipped Amplatzer wire 300 cm was advanced into the left subclavian  artery.  Using a long shuttle sheath, the left subclavian artery was  selectively cannulated.  Hence, we dilated the lesion with a 7.0 x 20 mm  FoxCross balloon followed by stenting with at 10.0 x 20-mm self-  expanding stent implantation with excellent results.  The same stent was  postdilated with the same balloon at 10 atmospheric pressure.  The  balloon was then withdrawn out of body in the usual fashion.  Angiography was performed.  Excellent results were noted.   The sheath was pulled out of the body and attention was directed towards  the right renal artery.  Using a 7-French short LIMA guide catheter,  right renal artery was selectively cannulated.   Pressure gradient across  the right renal artery was confirmed.  Then, using a stabilizer guide  with 0.01 fourth of an inch stabilizer guidewire, the renal artery was  wired and direct stenting was performed using a 5.0 x 12-mm Herculink  stent deployed at 10 atmospheric pressure, postdilated the same stent  balloon at around 14 atmospheric pressure at the ostium.  Excellent  results were noted.  The guidewire was withdrawn, and angiography  repeated.  Guide catheter guide catheter pulled out of body in the usual  fashion.  The patient tolerated the procedure well.  No immediate  complication noted.      [name]. Jacinto Halim, MD  Electronically Signed     JRG/MEDQ  D:  09/28/2008  T:  09/29/2008  Job:  [id number]   cc:   [facility]  Richard A. Alanda Amass, M.D.

## 2010-11-08 NOTE — Assessment & Plan Note (Signed)
[facility]                         GASTROENTEROLOGY OFFICE NOTE   NAME:DUNNLodie, Waheed                         MRN:          [id number]  DATE:05/21/2007                            DOB:          04/27/1941    Ms. Larrick is a 70 year old white female with irritable bowel syndrome.  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  She is  also status post remote cholecystectomy and appendectomy.  Last  colonoscopy was in May 2008 and showed patent ileocolic anastomosis with  normal-appearing colonic mucosa.  She initially had severe diarrhea, but  most recently, her bowel habits have stabilized, and she is now having  formed stools.  For episodes of diarrhea, the patient takes Questran or  Lomotil.  For constipation, she has MiraLax.  She denies any abdominal  pain.   GI MEDICATIONS:  1. Levbid 0.375 mg p.o. b.i.d.  2. AcipHex 20 mg p.o. daily.  3. She is also on metoprolol 25 mg p.o. daily.  4. Plavix 75 mg p.o. daily.  5. Lisinopril 20 mg p.o. daily.  6. Crestor 20 mg at bedtime.  7. Cymbalta 20 mg b.i.d.  8. Mobic 50 mg daily.  9. Singulair.  10.Zyrtec.  11.Loratadine 10 mg p.o. q. a.m.   PHYSICAL EXAM:  Blood pressure 140/70, pulse 80, and weight 142 pounds.  This is her stable weight.  She was alert and oriented in no acute distress.  SKIN:  Warm and dry.  Sclerae not icteric.  LUNGS:  Clear to auscultation.  COR:  Normal S1, normal S2.  ABDOMEN:  Soft, nondistended, normoactive bowel sounds, well-healed  surgical scar.  No tenderness or palpable mass.  RECTAL:  Normal rectal tone.  Ampulla was with small amount of heme-  negative stool.   IMPRESSION:  49. A 70 year old white female with stable irritable bowel syndrome.  2. Status post right hemicolectomy for cecal volvulus in 2007, now      doing well.   PLAN:  1. Continue Levbid 0.375 mg p.o.  b.i.d.  2. The patient is going to have a cardio workup by Dr. Alanda Amass in      the next few weeks.  I will see her on a p.r.n. basis.     Hedwig Morton. Juanda Chance, MD  Electronically Signed    DMB/MedQ  DD: 05/21/2007  DT: 05/21/2007  Job #: [id number]   cc:   Neena Rhymes, M.D.  Richard A. Alanda Amass, M.D.

## 2010-11-08 NOTE — Assessment & Plan Note (Signed)
[facility]                         GASTROENTEROLOGY OFFICE NOTE   NAME:[name]                         MRN:          [id number]  DATE:07/25/2007                            DOB:          02-21-1941    [name] is a 70 year old white female former patient of Dr. Victorino Dike.  I have been following her for about 2 years now for irritable  bowel syndrome.  She is here today because of acute diarrheal illness  that started about a week ago.  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  She, since then, has had  episodes of bacterial overgrowth alternating with constipation.  She, at  times, has used laxatives with resulting diarrhea.  Other history is  significant for high blood pressure, osteoarthritis, depression, and  candida esophagitis.  She is followed by Dr. Alanda Amass for left  subclavian bruit, claudication, systemic hypertension, hyperlipidemia,  and question of a TIA.  She stopped smoking in 1996.  She saw Dr.  Alanda Amass earlier today.  He put her on Cipro 250 mg twice a day for  presumed sinus infection.  She continues on Plavix 75 mg daily.  The  patient was on Flagyl until last week, which, in her opinion, was very  good at controlling her diarrhea.  She had run out of the medicine last  week at the time that her diarrhea recurred.   MEDICATIONS:  1. Metoprolol 25 mg p.o. daily.  2. Plavix 75 mg p.o. daily.  3. Lisinopril 20 mg p.o. daily.  4. Crestor 20 mg nightly.  5. Cymbalta 20 mg p.o. b.i.d.  6. AcipHex 20 mg p.o. daily.  7. Mobic 15 mg daily.  8. Singulair 10 mg daily.  9. Hyoscyamine 0.375 mg p.o. b.i.d.  10.Zyrtec 10 mg nightly.  11.Loratadine 10 mg daily.   PHYSICAL EXAM:  Blood pressure 110/70, pulse 88, weight 142.6 pounds.  She was alert and oriented, in no acute distress.  SKIN:  Warm and dry.  She had some arthritic changes over her PIP joints of both hands.  LUNGS:   Clear to auscultation.  COR:  Normal S1, normal S2.  ABDOMEN:  Soft with soft bruit left of the umbilicus.  There was diffuse  tenderness, mostly in the left lower quadrant and also in the  epigastrium.  Bowel sounds were hyperactive in the upper abdomen,  normoactive in the lower abdomen.  There was no distension.  RECTAL:  Small amount of trace Hemoccult positive stool.  There was some  erythema around the rectum, most likely from having diarrhea.   IMPRESSION:  64. A 70 year old white female with acute diarrheal illness, most      likely postoperative diarrhea due to bacterial overgrowth or even      coloretic diarrhea.  She is status post right hemicolectomy for      cecal volvulus in 2007.  2. History of irritable bowel syndrome.   PLAN:  1. Resume Flagyl 250 p.o. t.i.d.  2. Continue Cipro as per Dr. Alanda Amass.  3. Add Questran 4  g a day.  4. Low residue diet.  5. Align samples given 1 p.o. daily to maintain bacterial flora.     Hedwig Morton. Juanda Chance, MD  Electronically Signed    DMB/MedQ  DD: 07/25/2007  DT: 07/25/2007  Job #: [id number]   cc:   Gerlene Burdock A. Alanda Amass, M.D.

## 2010-11-08 NOTE — Telephone Encounter (Signed)
Attempted to contact at all numbers in chart but unable to reach patient.

## 2010-11-11 NOTE — Assessment & Plan Note (Signed)
 [facility]                         GASTROENTEROLOGY OFFICE NOTE   NAME:DUNNZulay, Corrie                         MRN:          [id number]  DATE:10/10/2006                            DOB:          09/11/1940    Ms. Jabs is a 70 year old white female with a history of recent cecal  volvulus, status post resection on an emergency basis by Dr. Colin Benton in  October, 2007 after she presented with severe abdominal pain and signs  of small bowel obstruction.  She has done well since then but developed  severe diarrhea about two weeks and nocturnal as well as daytime stools,  which are watery.  She denies abdominal pain or fever.  She was at the  time out of town and was seen in the emergency room at [facility], where she was found to have a hemoglobin of 9.9 and  hematocrit of 30.3.  Her liver function tests were normal.  Her KUB  showed post cholecystectomy state, otherwise normal KUB.   MEDICATIONS:  1. Zyrtec10 mg daily.  2. Mobic 15 mg daily.  3. Cymbalta 20 mg b.i.d.  4. AcipHex 20 mg p.o. daily.  5. Singulair 10 mg daily.  6. Levbid 0.375 mg p.o. b.i.d.  7. Lisinopril 20 mg p.o. daily.  8. Crestor 20 mg p.o. daily.  9. Clarinex 5 mg p.o. daily.  10.Prednisone 10 mg daily.  11.She has completed a course of cefoxitin.   PHYSICAL EXAMINATION:  VITAL SIGNS:  Blood pressure 144/86, pulse 80.  Weight 138 pounds.  GENERAL:  She is in no distress.  Alert and oriented, somewhat pale.  HEENT:  Sclerae are anicteric.  Oral cavity is normal.  LUNGS:  Clear to auscultation.  COR:  Normal S1 and S2.  ABDOMEN:  Soft.  There are normoactive bowel sounds.  Minimal tenderness  in the right upper quadrant and right middle quadrant.  RECTAL:  There was no distention on rectal exam.  The perianal area was  somewhat macerated and irritated from diarrhea.  The rectal tone was  normal.  Stool was hemoccult negative, yellow, soft.   IMPRESSION:  A  70 year old white female with acute diarrhea, status post  right hemicolectomy for cecal volvulus six months ago.  Her diarrhea is  likely infectious due to choleretic factors.   PLAN:  1. Flagyl 250 p.o. t.i.d. x10 days.  2. Questran 1 pack 4 gm per pack, 1 pack daily.  3. Samples of Analpram cream given for rectal irritation.  4. Tandem Plus iron supplements to take 1 daily.  5. Upper GI series, small bowel follow-through to assess for      anastomotic stricture, to rule out partial SBO     Dora M. Juanda Chance, MD  Electronically Signed    DMB/MedQ  DD: 10/10/2006  DT: 10/10/2006  Job #: [id number]   cc:   Devra Dopp, MD

## 2010-11-11 NOTE — Discharge Summary (Signed)
NAME:  [name], MARRONE NO.:  [id number]   MEDICAL RECORD NO.:  [id number]          PATIENT TYPE:  INP   LOCATION:  1612                         FACILITY:  [facility]   PHYSICIAN:  Alfonse Ras, MD   DATE OF BIRTH:  1940/12/23   DATE OF ADMISSION:  04/13/2006  DATE OF DISCHARGE:  04/20/2006                               DISCHARGE SUMMARY   ADMISSION DIAGNOSIS:  Vomiting and history of irritable bowel disease.   DISCHARGE DIAGNOSIS:  Sigmoid volvulus.   CONDITION ON DISCHARGE:  Good and  improved.   FOLLOWUP:  With me in 2-3 weeks after discharge.   DISPOSITION:  Discharged to home.   MEDICATIONS:  Include:  1. Cymbalta.  2. AcipHex.  3. Crestor.  4. Vicodin at the time of discharge.   HISTORY OF PRESENT ILLNESS:  The patient was admitted by Dr. Leone Payor  with complaints of vomiting for 24 hours.  She has had a history of  irritable bowel syndrome.  She was admitted and underwent CT of the  abdomen in November 2006, which showed no abnormalities.  While in the  hospital here, the patient had worsening abdominal pain, nausea, and  vomiting and I was consulted.  She looked like she had a probable small  bowel obstruction.  She was quite tender to my exam and so she was taken  to the operating room for emergent laparotomy.  She was found to have a  necrotic but non-perforated cecum and distal small bowel obstruction  with volvulus.  She underwent an ileocolectomy.  Postoperatively, the  patient was transferred to the intensive care unit and seen by pulmonary  critical care medicine.  She remained on Maxipime and Flagyl and had a  temperature of 102.  She was quite confused.  Her incision was okay.  Her white count was 4.3 thousand on postoperative day #1.  She did  slowly improve over the  next number of days and NG tube was removed, and she was started on  clear liquids by postoperative day #3, and moved to the floor.  Her diet  was then advanced, her PCA was  stopped, and she was ready for discharge  home by April 21, 2007.      Alfonse Ras, MD  Electronically Signed     KRE/MEDQ  D:  07/03/2006  T:  07/03/2006  Job:  [id number]   cc:   Iva Boop, MD,FACG  [facility]  [address] [place], [place] [address]

## 2010-11-11 NOTE — Op Note (Signed)
[facility]  Patient:    [name], [name] Visit Number: [id number] MRN: [id number]          Service Type: SUR Location: 3W 4782 01 Attending Physician:  Laqueta Jean Dictated by:   Vonzell Schlatter Patsi Sears, M.D. Proc. Date: 10/31/01 Admit Date:  10/31/2001   CC:         Ulyess Mort, M.D. 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  OPERATION:  Pubovaginal sling with anterior vaginal vault repair.  SURGEON:  Sigmund I. Patsi Sears, M.D.  ANESTHESIA:  General endotracheal.  PREPARATION:  After appropriate preanesthesia, the patient was brought to the operating room, placed on the operating table in dorsal supine position where general endotracheal anesthesia was introduced. The patient was then replaced in the low Allen stirrups, dorsal lithotomy position, where the pubis was prepped with Betadine solution and draped in the usual fashion.  DESCRIPTION OF PROCEDURE:  Inspection revealed a postmenopausal thinned urethra and vaginal epithelium. A grade 2-3 cystourethrocele was noted. A 1-cm urethral incision was then made and subcutaneous tissue dissected on either side of previously placed Foley catheter. Two separate suprapubic incisions were then made two fingerbreadths above the pubic tubercle, and the right and left lateral margins of the pelvis. Subcutaneous tissue was dissected. The two SPARC needles were then placed at the same time, and cystoscopy reveals no evidence of any needle within the bladder or the urethra. The bladder neck moved well with mobilization of the [facility] needles. The [facility] sling was then placed and the right angle clamp was placed behind the [facility] to create a small loop at the level of the urethra to keep it from being too tight. The [facility] plastic was then cut,  and the plastic cover removed. The sling was then cut below the skin level on both the right and left sides.  A 2-cm x 7-cm portion of Tutoplast fascia was then placed over the [facility] to afford tissue in between the sling and the thinned out vaginal epithelium. This epithelium was then closed with running 3-0 Vicryl suture.  It is noted that Marcaine 0.5% plus epinephrine 1:200,000 was injected previously into the pubovaginal tissue, allowed to set for approximately five minutes. Incision was then made in the anterior vaginal vault, from the level of the cervical scar to the level of the urethra. Subcutaneous tissue was dissected with blunt and sharp dissection. Following that, horizontal mattress Kelly plication was accomplished, which was reinforced by a 2-cm x 7-cm portion of Tutoplast fascia. This afforded a fascial tissue interposition between the Kelly plication and the vaginal vault epithelium. The devascularized edge of vaginal epithelium was removed, and the vaginal vault closed with two layers of 3-0 Vicryl, interrupted and running suture. Packing was placed with estrogenized lubricant and the suprapubic wounds were closed with skin stapler. The patient was then awakened and taken to the recovery room in good condition. Dictated by:   Vonzell Schlatter Patsi Sears, M.D. Attending Physician:  Laqueta Jean DD:  10/31/01 TD:  10/31/01 Job: [id number]

## 2010-11-11 NOTE — Consult Note (Signed)
NAME:  [name], [name] NO.:  [id number]   MEDICAL RECORD NO.:  [id number]          PATIENT TYPE:  INP   LOCATION:  1505                         FACILITY:  [facility]   PHYSICIAN:  Alfonse Ras, MD   DATE OF BIRTH:  [birth year]   DATE OF CONSULTATION:  04/13/2006  DATE OF DISCHARGE:                                   CONSULTATION   Patient is a 70 year old white female with a history of IBS who was seen by  Dr. Corinda Gubler yesterday with a 2-3 day history of worsening abdominal pain,  nausea and vomiting.  She was admitted and diagnosed with probable small-  bowel obstruction.  Patient is status post open cholecystectomy and open  appendectomy in the remote past.  Work-up in November 2006, showed  questionable Crohn's disease of some bowel loops.  CT scan in December 2006,  was negative.   PAST MEDICAL HISTORY:  1. Hypertension.  2. Osteoarthritis.  3. Depression.  4. IBS.  5. Gastroesophageal reflux disease.  6. Hysterectomy.  7. Cholecystectomy.  8. Appendectomy.   MEDICATIONS:  Per her H&P.   I was asked to see the patient today after the patient developed worsening  pain and had a CT scan consistent with a cecal volvulus.  Her white count  increased from 16,000 yesterday to 20,000 today.  Patient is complaining of  worsening discomfort.   PHYSICAL EXAMINATION:  VITAL SIGNS:  Temperature 98.6.  ABDOMEN:  Diffuse tenderness particularly in the upper abdomen.  This is  consistent with localized peritonitis.   On CT scan, she has what appears like a cecal volvulus in the left upper  quadrant, some free fluid in the right abdomen but no evidence of free air.   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   PLAN:  Exploratory laparotomy with probable light colonic resection.      Alfonse Ras, MD  Electronically Signed     KRE/MEDQ  D:  04/13/2006  T:  04/15/2006  Job:  [id number]   cc:   Ulyess Mort, MD  [address]  [place]  [place] [address]
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
## 2010-11-11 NOTE — Op Note (Signed)
NAME:  [name], [name] NO.:  [id number]   MEDICAL RECORD NO.:  [id number]          PATIENT TYPE:  INP   LOCATION:  0156                         FACILITY:  [facility]   PHYSICIAN:  Alfonse Ras, MD   DATE OF BIRTH:  1941-03-13   DATE OF PROCEDURE:  04/13/2006  DATE OF DISCHARGE:                                 OPERATIVE REPORT   PREOPERATIVE DIAGNOSIS:  Cecal volvulus and probable ischemic bowel.   POSTOPERATIVE DIAGNOSIS:  Infarcted but nonperforated colon and distal small  bowel.   PROCEDURE:  Ileocolectomy with primary anastomosis of the ileum to the  distal transverse colon.   SURGEON:  Alfonse Ras, MD.   ASSISTANT:  None.   ANESTHESIA:  General.   DESCRIPTION:  The patient was taken to the operating room and placed in the  supine position.  After adequate general anesthesia was induced using  endotracheal tube, the abdomen was prepped and draped in normal sterile  fashion.  Using a vertical midline incision, I dissected down to the fascia  and the fascia was opened vertically.  On entering the abdomen there were a  few omental adhesions with a significant amount of fluid which was not foul  smelling.  I needed to extend the incision further inferiorly, seeing how  large the infarcted area of colon was.  The cecum and its malrotation of  volvulus was identified and reduced.  However, the distal small bowel  extending about 10 cm, as well as the right colon, was infarcted.  The small  bowel was transected using a 75 mm GI stapling device at a very viable  segment.  The colon was divided in the distal transverse colon in a similar  fashion.  The patient had significant redundant sigmoid colon.  The  mesentery was taken down with a ligature technique.  There was significant  edema in the mesentery as well.  There was no gross contamination or  perforation.  The specimen was sent for pathologic evaluation.  A side-to-  side ileocolotomy was then made in  a standard fashion using a 75 mm GIA  stapling device and a TA stapling device.  This was reinforced with Tisseel.  The abdomen was copiously irrigated with about 6 or 7L of saline solution.  Adequate hemostasis of the mesentery was ensured and the mesentery was  closed with interrupted #2-0 silk sutures.  The fascia was closed with  running #1 Novofil.  The skin was closed with staples and Telfa wicks were  placed between the staples.  The patient was in critical condition and was  taken to the Intensive Care Unit.      Alfonse Ras, MD  Electronically Signed     KRE/MEDQ  D:  04/13/2006  T:  04/15/2006  Job:  [id number]   cc:   Iva Boop, MD,FACG  [facility]  [address] [place], [place] [address]   Ulyess Mort, MD  [address]  [place]  [place] [address]

## 2010-12-01 ENCOUNTER — Other Ambulatory Visit: Payer: Self-pay | Admitting: Family Medicine

## 2010-12-01 NOTE — Telephone Encounter (Signed)
Refill request

## 2010-12-02 ENCOUNTER — Other Ambulatory Visit: Payer: Self-pay | Admitting: Family Medicine

## 2010-12-02 ENCOUNTER — Ambulatory Visit: Payer: Medicare Other | Admitting: Family Medicine

## 2010-12-02 MED ORDER — CLOPIDOGREL BISULFATE 75 MG PO TABS: 75.0000 mg | ORAL_TABLET | Freq: Every day | ORAL | Status: DC

## 2010-12-12 ENCOUNTER — Encounter: Payer: Self-pay | Admitting: Family Medicine

## 2010-12-12 ENCOUNTER — Ambulatory Visit (INDEPENDENT_AMBULATORY_CARE_PROVIDER_SITE_OTHER): Payer: Medicare Other | Admitting: Family Medicine

## 2010-12-12 DIAGNOSIS — E7800 Pure hypercholesterolemia, unspecified: Secondary | ICD-10-CM

## 2010-12-12 DIAGNOSIS — K589 Irritable bowel syndrome without diarrhea: Secondary | ICD-10-CM

## 2010-12-12 DIAGNOSIS — F339 Major depressive disorder, recurrent, unspecified: Secondary | ICD-10-CM

## 2010-12-12 DIAGNOSIS — M25559 Pain in unspecified hip: Secondary | ICD-10-CM

## 2010-12-12 DIAGNOSIS — I10 Essential (primary) hypertension: Secondary | ICD-10-CM

## 2010-12-12 MED ORDER — CITALOPRAM HYDROBROMIDE 20 MG PO TABS: 20.0000 mg | ORAL_TABLET | Freq: Every day | ORAL | Status: DC

## 2010-12-12 MED ORDER — POLYSACCHARIDE IRON COMPLEX 150 MG PO CAPS: 150.0000 mg | ORAL_CAPSULE | Freq: Two times a day (BID) | ORAL | Status: DC

## 2010-12-12 MED ORDER — FOLIC ACID 1 MG PO TABS: 1.0000 mg | ORAL_TABLET | Freq: Two times a day (BID) | ORAL | Status: DC

## 2010-12-12 MED ORDER — MONTELUKAST SODIUM 10 MG PO TABS: 5.0000 mg | ORAL_TABLET | Freq: Every day | ORAL | Status: DC

## 2010-12-12 MED ORDER — DIPHENOXYLATE-ATROPINE 2.5-0.025 MG PO TABS: 1.0000 | ORAL_TABLET | Freq: Four times a day (QID) | ORAL | Status: DC

## 2010-12-12 NOTE — Patient Instructions (Addendum)
i sent the scripts you asked for to the pharmacy  For the celexa: 1st:  Take the cymbalta only 1 time per day for 3 days then stop 2nd: start the celexa at 1/2 tab for 3 days after you have stopped the cymbalta 3rd: after 3 days, increase the celexa to a full tab

## 2010-12-13 ENCOUNTER — Telehealth: Payer: Self-pay | Admitting: Family Medicine

## 2010-12-13 NOTE — Telephone Encounter (Signed)
Called in rx stating that she needed brand name only, patient informed.

## 2010-12-13 NOTE — Telephone Encounter (Signed)
Pt calling re: her celexa rx, says she told MD several times she has to have the name brand, she cannot use the generic, asking if RN can call this into the pharmacy at [facility] asap.

## 2010-12-15 NOTE — Telephone Encounter (Signed)
Pt states she needs Prior auth for this medication

## 2010-12-16 NOTE — Progress Notes (Signed)
HYPERTENSION  Disease Monitoring: Blood pressure range-150s systolic Chest pain- none      Dyspnea- none  Medications: Compliance- changes her medications based on percieved side effects Lightheadedness- none   Edema- none   HYPERLIPIDEMIA  Disease Monitoring:  Lab Results  Component Value Date   LDLCALC 84 04/23/2008   Currently followed by cards.  Stopped her lipitor, now on cholestipol.   See symptoms for Hypertension  Medications: Compliance- changing meds frequently RUQ pain- none  Muscle aches- none    ROS See HPI above   PMH Smoking Status noted    Diarrhea-improved.  Took flagyl, florastor for diarrhea.  Has appt for colonoscopy on July 16th.  Will be admitted for clean out the night before.  Arthritis--seen by ortho and received injection for trochanteric bursitis.  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

## 2010-12-16 NOTE — Assessment & Plan Note (Signed)
Stopped her lipitor.  On cholestipol

## 2010-12-16 NOTE — Assessment & Plan Note (Addendum)
Wants to go back on celexa, feels that only brand name helps.  Will taper cymbalta and taper up celexa.  Has crazy dreams.  Still some depression but doing more things and feeling better.

## 2010-12-16 NOTE — Assessment & Plan Note (Signed)
D/cd lisinopril.  Still on bystolic and dyazide.  BP high but pt is going to be following with her cardiologist.  She does not want to restart her medications BP Readings from Last 3 Encounters:  12/12/10 154/83  10/14/10 164/77  07/01/10 146/73

## 2010-12-16 NOTE — Assessment & Plan Note (Signed)
diarhea improved.  Following with GI for colonoscopy

## 2010-12-16 NOTE — Telephone Encounter (Signed)
Can we get a prior auth form for this?

## 2010-12-16 NOTE — Assessment & Plan Note (Signed)
Treated for trochanteric bursitis at [facility].  Feeling better now

## 2010-12-19 NOTE — Telephone Encounter (Signed)
Called pharmacy and asked them to fax over a prior auth form.

## 2010-12-26 ENCOUNTER — Telehealth: Payer: Self-pay | Admitting: Family Medicine

## 2010-12-26 NOTE — Telephone Encounter (Signed)
Spoke with patient and told her that the Mobic has been discontinued.  She explained that that was per her request to Dr. Hulen Luster, she wanted to try to do w/o it.  She is now in so much pain that she needs to re-start it.  Told her I would have to talk with Dr. Hulen Luster first and would call her back.  Patient appreciative.

## 2010-12-26 NOTE — Telephone Encounter (Signed)
Called [employer] and got approval for a refill on her Mobic.  They will call her to let her know if has been prior approved.  I will call her as well.

## 2010-12-26 NOTE — Telephone Encounter (Signed)
Pt calling re: her medication mobic, is out & needs [place] to call [phone] to have more authorized, pt unsure if this number is the pharmacy or insurance company. Told pt RN would call her back.

## 2011-01-09 ENCOUNTER — Observation Stay (HOSPITAL_COMMUNITY)
Admission: AD | Admit: 2011-01-09 | Discharge: 2011-01-10 | Disposition: A | Discharge status: Home / Self Care | Payer: Medicare Other | Source: Ambulatory Visit | Attending: Gastroenterology | Admitting: Gastroenterology

## 2011-01-09 DIAGNOSIS — I10 Essential (primary) hypertension: Secondary | ICD-10-CM | POA: Insufficient documentation

## 2011-01-09 DIAGNOSIS — Z7902 Long term (current) use of antithrombotics/antiplatelets: Secondary | ICD-10-CM | POA: Insufficient documentation

## 2011-01-09 DIAGNOSIS — K5730 Diverticulosis of large intestine without perforation or abscess without bleeding: Secondary | ICD-10-CM | POA: Insufficient documentation

## 2011-01-09 DIAGNOSIS — K589 Irritable bowel syndrome without diarrhea: Principal | ICD-10-CM | POA: Insufficient documentation

## 2011-01-09 DIAGNOSIS — Z90710 Acquired absence of both cervix and uterus: Secondary | ICD-10-CM | POA: Insufficient documentation

## 2011-01-09 DIAGNOSIS — J4489 Other specified chronic obstructive pulmonary disease: Secondary | ICD-10-CM | POA: Insufficient documentation

## 2011-01-09 DIAGNOSIS — Q438 Other specified congenital malformations of intestine: Secondary | ICD-10-CM | POA: Insufficient documentation

## 2011-01-09 DIAGNOSIS — R197 Diarrhea, unspecified: Secondary | ICD-10-CM | POA: Insufficient documentation

## 2011-01-09 DIAGNOSIS — Z79899 Other long term (current) drug therapy: Secondary | ICD-10-CM | POA: Insufficient documentation

## 2011-01-09 DIAGNOSIS — Z9089 Acquired absence of other organs: Secondary | ICD-10-CM | POA: Insufficient documentation

## 2011-01-09 DIAGNOSIS — J449 Chronic obstructive pulmonary disease, unspecified: Secondary | ICD-10-CM | POA: Insufficient documentation

## 2011-01-09 DIAGNOSIS — D649 Anemia, unspecified: Secondary | ICD-10-CM | POA: Insufficient documentation

## 2011-01-10 ENCOUNTER — Other Ambulatory Visit: Payer: Self-pay | Admitting: Gastroenterology

## 2011-01-13 ENCOUNTER — Ambulatory Visit: Payer: Medicare Other | Admitting: Family Medicine

## 2011-01-31 NOTE — H&P (Signed)
  NAME:  [name], [name] NO.:  [id number]  MEDICAL RECORD NO.:  [id number]  LOCATION:  1337                         FACILITY:  [facility]  PHYSICIAN:  Petra Kuba, M.D.    DATE OF BIRTH:  [birth year]  DATE OF ADMISSION:  01/09/2011 DATE OF DISCHARGE:                             HISTORY & PHYSICAL   HISTORY:  The patient is admitted for elective colon prep due to her inability to do that at home and her multiple other medical problems. She is having a colonoscopy for iron-deficiency anemia as well as persistent diarrhea and chronic abdominal pain.  PAST MEDICAL HISTORY/PAST SURGICAL HISTORY:  Her past medical history is pertinent for cholecystectomy, rib removal, ear surgery, appendectomy, hysterectomy, right colon removal due to a I believe of volvulus.  She has also had kidney surgeries and stent and a subclavian stent as well as asthma, ulcers, depression, heart murmur, hypertension, arthritis, and bronchitis.  CURRENT MEDICATIONS:  At home include; 1. Flagyl. 2. Ultram. 3. Singulair. 4. Plavix. 5. Motrin. 6. Nitroglycerin. 7. Multivitamins. 8. Cymbalta. 9. Bystolic. 10.Nexium. 11.Colestid. 12.Iron. 13.Omega-3. 14.Hydrochlorothiazide. 15.Zyrtec. 16.Glucosamine.  FAMILY HISTORY:  Pertinent for her dad with cancer, but no significant GI problems in the family.  SOCIAL HISTORY:  She did not smoke, does not drink.  ALLERGIES:  Codeine.  REVIEW OF SYSTEMS:  Pertinent for her primarily diarrhea and her arthritis.  PHYSICAL EXAMINATION:  GENERAL:  No acute distress, lying comfortably in bed. VITAL SIGNS:  Stable.  Afebrile. LUNGS:  Clear. HEART:  Regular rate and rhythm. ABDOMEN:  Soft, nontender.  ASSESSMENT: 1. The patient with multiple medical problems. 2. Left lower quadrant abdominal pain, diarrhea, chronic anemia.  PLAN:  The risks, benefits, methods of colonoscopy was discussed on multiple occasions, will admit her overnight for the  preparation and proceed tomorrow using anesthesias help to keep her comfortable since she has had some discomfort during previous colonoscopies with further workup and plans pending those findings.          ______________________________ Petra Kuba, M.D.     MEM/MEDQ  D:  01/10/2011  T:  01/10/2011  Job:  [id number]  Electronically Signed by Vida Rigger M.D. on 01/31/2011 04:33:49 PM

## 2011-01-31 NOTE — Op Note (Signed)
  [name], PEACE NO.:  [id number]  MEDICAL RECORD NO.:  [id number]  LOCATION:  1337                         FACILITY:  [facility]  PHYSICIAN:  Petra Kuba, M.D.    DATE OF BIRTH:  1940/09/12  DATE OF PROCEDURE: DATE OF DISCHARGE:                              OPERATIVE REPORT   PROCEDURE:  Colonoscopy with biopsy.  INDICATIONS:  Patient with chronic anemia, diarrhea, abdominal pain. Consent was signed after risks, benefits, methods, options thoroughly discussed multiple times in the past.  MEDICINES USED PER ANESTHESIA:  Propofol 180 mg, fentanyl 100 mcg, Versed 2 mg.  PROCEDURE:  Rectal inspection is negative.  Digital exam was negative. The video pediatric colonoscope was inserted and with some difficulty due to tortuous spastic sigmoid.  With abdominal pressure, it was able to be advanced to the anastomosis which was the approximate level of the hepatic flexure.  The anastomosis seemed to be a colon side to terminal ileum end.  The scope was inserted short ways up to the terminal ileum which was normal.  Further documentation and biopsies were obtained on insertion other than the few scattered sigmoid diverticula.  No other abnormalities were seen.  Scattered biopsies of the GI were obtained and put in the first container.  The scope was slowly withdrawn.  Random colon biopsies were obtained and put in the second container.  The prep was adequate.  There was some liquid stool that required washing and suctioning but no other abnormalities were seen other than above.  Once back in the rectum, anorectal pull-through and retroflexion was done in the rectum which was normal.  Scope was straightened and re-advanced short ways up the left side of the colon.  Air was suctioned.  Scope removed.  The patient tolerated the procedure well.  There was no obvious immediate complication.  ENDOSCOPIC DIAGNOSES: 1. 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 2. Long looping colon. 3. Normal colon to the anastomosis, status post random biopsy with a     small bowel and colon side anastomosis. 4. Normal terminal ileum with a few random biopsies of that as well.  PLAN:  Advance diet.  Probably can go home if no delayed complications. Await pathology with further workup and plans pending those findings.          ______________________________ Petra Kuba, M.D.     MEM/MEDQ  D:  01/10/2011  T:  01/10/2011  Job:  [id number]  cc:   Gerlene Burdock A. Alanda Amass, M.D. Fax: [phone]  Electronically Signed by Vida Rigger M.D. on 01/31/2011 04:33:53 PM

## 2011-02-07 ENCOUNTER — Telehealth: Payer: Self-pay | Admitting: Family Medicine

## 2011-02-07 NOTE — Telephone Encounter (Signed)
States that insurance will pay of shingles shot - needs rx for [employer] [phone]  Would also like flagyl for her diarrhea - thinks it was caused by BP meds.

## 2011-02-09 MED ORDER — ZOSTER VACCINE LIVE 19400 UNT/0.65ML ~~LOC~~ SOLR: 0.6500 mL | Freq: Once | SUBCUTANEOUS | Status: DC

## 2011-02-09 NOTE — Telephone Encounter (Signed)
Left message on vm informing of message from MD. 

## 2011-02-09 NOTE — Telephone Encounter (Signed)
zostavax sent in.  Diarrhea caused by medicine does not require abx.  Also, if she does need abx, she will need to be seen

## 2011-02-17 ENCOUNTER — Ambulatory Visit (INDEPENDENT_AMBULATORY_CARE_PROVIDER_SITE_OTHER): Payer: Medicare Other | Admitting: Family Medicine

## 2011-02-17 ENCOUNTER — Encounter: Payer: Self-pay | Admitting: Family Medicine

## 2011-02-17 VITALS — BP 171/76 | HR 72 | Temp 98.3°F | Wt 159.0 lb

## 2011-02-17 DIAGNOSIS — N3289 Other specified disorders of bladder: Secondary | ICD-10-CM

## 2011-02-17 DIAGNOSIS — I10 Essential (primary) hypertension: Secondary | ICD-10-CM

## 2011-02-17 DIAGNOSIS — R300 Dysuria: Secondary | ICD-10-CM

## 2011-02-17 LAB — POCT URINALYSIS DIPSTICK
Bilirubin, UA: NEGATIVE
Glucose, UA: NEGATIVE
Leukocytes, UA: NEGATIVE
Nitrite, UA: NEGATIVE
Urobilinogen, UA: 0.2

## 2011-02-17 LAB — BASIC METABOLIC PANEL
Potassium: 4 mEq/L (ref 3.5–5.3)
Sodium: 137 mEq/L (ref 135–145)

## 2011-02-17 MED ORDER — DARPAZ 81 MG PO TABS: 1.0000 | ORAL_TABLET | Freq: Four times a day (QID) | ORAL | Status: DC | PRN

## 2011-02-17 NOTE — Patient Instructions (Signed)
Your urine looked fine today, no infection I am refilling your darpaz today for spasm   Please take one half tab lisinopril and the dyazide until you see me next

## 2011-02-20 ENCOUNTER — Telehealth: Payer: Self-pay | Admitting: Family Medicine

## 2011-02-20 NOTE — Telephone Encounter (Signed)
Please contact the medicine rx line of [employer] for [place] that's bladder spasms.  Need to send in rx so pt can restart back on meds.  The fax # is [phone]

## 2011-02-20 NOTE — Assessment & Plan Note (Signed)
Seen by urology in [place], given urelle for spasm.  I reviewed those notes.  Pt in process of switching her care to [facility].  She will see a urologist there.  She would like to continue urelle while waiting for that appt.  She found that it worked well for her.

## 2011-02-20 NOTE — Progress Notes (Signed)
  Subjective:    Patient ID: [name], female    DOB: July 26, 1940, 70 y.o.   MRN: [id number]  HPI Pt here today for HTN and bladder spasm    HTN- has been off dyazide and lisinopril because she didn't think she needed it.  She restarted dyazide and lisinopril 10 yesterday because she felt her BP was going up.  No HA, CP, or SOB.  She is concerned that her medications cause diarrhea and she will stop them if she is havign an IBS flare.  Bladder spasm- pt brought her urology records with her today.  She has a history of a bladder sling btu she thinks that never improved her spasms.  She was on urelle and did will with it, but she has not seen the urologist recently.  She would like to restart this.   Review of Systems    Denies CP, SOB, HA, N/V/D, fever  Objective:   Physical Exam  Vital signs reviewed General appearance - alert, well appearing, and in no distress and oriented to person, place, and time Heart - normal rate, regular rhythm, normal S1, S2, no murmurs, rubs, clicks or gallops Chest - clear to auscultation, no wheezes, rales or rhonchi, symmetric air entry, no tachypnea, retractions or cyanosis       Assessment & Plan:  Bladder spasm Seen by urology in [place], given urelle for spasm.  I reviewed those notes.  Pt in process of switching her care to [facility].  She will see a urologist there.  She would like to continue urelle while waiting for that appt.  She found that it worked well for her.  HYPERTENSION, BENIGN SYSTEMIC She had stopped her dyazide and lisinopril but restarted dyazide and 10 of lisinopril on her own yesterday.  She noted elevated BPs on checking so she felt she needed BP meds.  Will check BMET today, bring back in one week for recheck

## 2011-02-20 NOTE — Assessment & Plan Note (Signed)
She had stopped her dyazide and lisinopril but restarted dyazide and 10 of lisinopril on her own yesterday.  She noted elevated BPs on checking so she felt she needed BP meds.  Will check BMET today, bring back in one week for recheck

## 2011-02-21 NOTE — Telephone Encounter (Signed)
[name] called back to inquire about someone calling her earlier.  Informed that nothing was noted from our office that a call was made.  Would like to have provider send rx for meds asap.

## 2011-02-22 NOTE — Telephone Encounter (Signed)
i sent the urelle to her pharmacy during her visit.  i checked the record and it says normal not print so i think it went through

## 2011-02-23 NOTE — Telephone Encounter (Signed)
I think she needs it sent to the fax number in the message.  If you will print it I will fax it Fleeger, [name]

## 2011-02-24 ENCOUNTER — Other Ambulatory Visit (HOSPITAL_COMMUNITY)
Admission: RE | Admit: 2011-02-24 | Discharge: 2011-02-24 | Disposition: A | Discharge status: Home / Self Care | Payer: Medicare Other | Source: Ambulatory Visit | Attending: Family Medicine | Admitting: Family Medicine

## 2011-02-24 ENCOUNTER — Ambulatory Visit (INDEPENDENT_AMBULATORY_CARE_PROVIDER_SITE_OTHER): Payer: Medicare Other | Admitting: Family Medicine

## 2011-02-24 VITALS — BP 167/71 | HR 61 | Temp 98.2°F | Ht 62.0 in | Wt 158.0 lb

## 2011-02-24 DIAGNOSIS — Z124 Encounter for screening for malignant neoplasm of cervix: Secondary | ICD-10-CM | POA: Insufficient documentation

## 2011-02-24 DIAGNOSIS — Z01419 Encounter for gynecological examination (general) (routine) without abnormal findings: Secondary | ICD-10-CM | POA: Insufficient documentation

## 2011-02-24 DIAGNOSIS — Z0000 Encounter for general adult medical examination without abnormal findings: Secondary | ICD-10-CM

## 2011-02-24 DIAGNOSIS — N3289 Other specified disorders of bladder: Secondary | ICD-10-CM

## 2011-02-24 DIAGNOSIS — R197 Diarrhea, unspecified: Secondary | ICD-10-CM

## 2011-02-24 DIAGNOSIS — I10 Essential (primary) hypertension: Secondary | ICD-10-CM

## 2011-02-24 MED ORDER — POLYSACCHARIDE IRON COMPLEX 150 MG PO CAPS: 150.0000 mg | ORAL_CAPSULE | Freq: Two times a day (BID) | ORAL | Status: DC

## 2011-02-24 MED ORDER — HYDROCHLOROTHIAZIDE 25 MG PO TABS: 25.0000 mg | ORAL_TABLET | Freq: Every day | ORAL | Status: DC

## 2011-02-24 MED ORDER — BUDESONIDE 3 MG PO CP24: 9.0000 mg | ORAL_CAPSULE | ORAL | Status: DC

## 2011-02-24 MED ORDER — DARPAZ 81 MG PO TABS: 1.0000 | ORAL_TABLET | Freq: Four times a day (QID) | ORAL | Status: DC | PRN

## 2011-02-24 NOTE — Telephone Encounter (Signed)
Faxed to number requested. Fleeger, [name]  

## 2011-02-24 NOTE — Assessment & Plan Note (Signed)
Still with diarrhea.  Missed her Dr. Ewing Schlein appt this week.  Will try to make another one for f/u.  Has not been taking her lomotil.  Encouraged her to take it.

## 2011-02-24 NOTE — Progress Notes (Signed)
  Subjective:    Patient ID: [name], female    DOB: 11/08/1940, 70 y.o.   MRN: [id number]  HPI  Bladder spasm- still wih nightly spasm.  She has some ativan that she takes for this occasionally with some help.  No burning.  Diarrhea- IBS flair this week.  Increased her budesonide to 9mg .  Not taking lomotil.  Yesterday had a formed stool.  HTN- stopped lisinopril and dyazide due to diarrhea.  BP up today.  No CP or SOB  Well woman- can't exercise much due to IBS.  Eating diet based on what helps with stomach pain.  Has not gotten mammo yet. Will go for wellness visit next week.  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  Pt wants vaginal estrogen for baldder spasm  Review of Systems     Objective:   Physical Exam Vital signs reviewed General appearance - alert, well appearing, and in no distress and oriented to person, place, and time Heart - normal rate, regular rhythm, normal S1, S2, no murmurs, rubs, clicks or gallops Chest - clear to auscultation, no wheezes, rales or rhonchi, symmetric air entry, no tachypnea, retractions or cyanosis Abdomen - soft, nontender, nondistended, no masses or organomegaly  GYN- vaginal cuff pink, no tenderness at urethra, there is a stitch felt 1 inch from the introitus at the 12 oclock position      Assessment & Plan:  Bladder spasm Did not get darpaz yet.  Still with spasm. Plans to see urogyn in [facility]  DIARRHEA Still with diarrhea.  Missed her Dr. Ewing Schlein appt this week.  Will try to make another one for f/u.  Has not been taking her lomotil.  Encouraged her to take it.  HYPERTENSION, BENIGN SYSTEMIC Stopped her lisinopril and dyazide due to diarrhea.  Will try to start one at a time.  Will start with just HCTZ.  Will have Dr. Raymondo Band see pt to discuss medications and side effects and clean out her pill box full of medications that she stops and starts.  Well woman exam Pap today.   Encouraged her to go for mammo soon.  Reviewed diet and exercise which she feels that she is limited in due to her IBS>   since pt has normal for age genitalia, will not prescribe vaginal estrogen.  Pt to see urogyn for eval of the stitch that is exposed and for bladder spasm

## 2011-02-24 NOTE — Assessment & Plan Note (Signed)
Pap today.  Encouraged her to go for mammo soon.  Reviewed diet and exercise which she feels that she is limited in due to her IBS>

## 2011-02-24 NOTE — Patient Instructions (Signed)
Stop lisinopril and dyazide  Start HCTZ (at pharmacy)  I sent Darpaz to the insurance number  Make an appt to see Dr. Raymondo Band to clean out your medicines and talk about your blood pressure

## 2011-02-24 NOTE — Assessment & Plan Note (Signed)
Stopped her lisinopril and dyazide due to diarrhea.  Will try to start one at a time.  Will start with just HCTZ.  Will have Dr. Raymondo Band see pt to discuss medications and side effects and clean out her pill box full of medications that she stops and starts.

## 2011-02-24 NOTE — Assessment & Plan Note (Signed)
Did not get darpaz yet.  Still with spasm. Plans to see urogyn in [facility]

## 2011-03-01 ENCOUNTER — Encounter: Payer: Self-pay | Admitting: Family Medicine

## 2011-03-01 NOTE — Telephone Encounter (Signed)
Faxed to that number Baelyn Doring, [name]

## 2011-03-01 NOTE — Telephone Encounter (Signed)
The correct Fax # is [phone].

## 2011-03-01 NOTE — Telephone Encounter (Signed)
LMOVM for pt to check number and give [place] a call back.  The fax number she gave [place] is not working.  Will forward to [employer], [employer]

## 2011-03-02 ENCOUNTER — Ambulatory Visit: Payer: Medicare Other | Admitting: Home Health Services

## 2011-03-02 MED ORDER — PHENAZOPYRIDINE HCL 200 MG PO TABS: 200.0000 mg | ORAL_TABLET | Freq: Three times a day (TID) | ORAL | Status: DC | PRN

## 2011-03-02 NOTE — Telephone Encounter (Signed)
To MD. Fleeger, [name]  

## 2011-03-02 NOTE — Telephone Encounter (Signed)
Pt will have to see urologist then.  The other meds are not appropriate for this treatment.

## 2011-03-02 NOTE — Telephone Encounter (Signed)
Spoke to insurance and they will not pay for this med.  They gave her 2 other meds to try: Vancomycin HCL or Trimethoprim [facility] [address]  Would like to try something today - she is in pain and wants to try something.

## 2011-03-02 NOTE — Telephone Encounter (Signed)
LMOVM informing her of the note from MD, ask that she call back and let us know if she is willing to see a urologist. Milas Gain, [name]

## 2011-03-02 NOTE — Telephone Encounter (Signed)
Addended by: Reginold Agent on: 03/02/2011 04:04 PM   Modules accepted: Orders

## 2011-03-02 NOTE — Telephone Encounter (Signed)
Talked to pt about her concerns.  Sent in the medicine she wanted.  She seems happy with that result.

## 2011-03-02 NOTE — Telephone Encounter (Signed)
Pt states she [place] and wants to know if she can have Phenazopyridine 200 mg.  She is upset that her doctor is not wanting to help her with this problem. [facility] [address]

## 2011-03-11 ENCOUNTER — Other Ambulatory Visit: Payer: Self-pay | Admitting: Family Medicine

## 2011-03-12 NOTE — Telephone Encounter (Signed)
Refill request

## 2011-05-02 ENCOUNTER — Encounter: Payer: Self-pay | Admitting: Home Health Services

## 2011-05-04 ENCOUNTER — Ambulatory Visit: Payer: Medicare Other | Admitting: Family Medicine

## 2011-05-05 ENCOUNTER — Encounter: Payer: Self-pay | Admitting: Family Medicine

## 2011-05-05 ENCOUNTER — Ambulatory Visit (INDEPENDENT_AMBULATORY_CARE_PROVIDER_SITE_OTHER): Payer: Medicare Other | Admitting: Family Medicine

## 2011-05-05 VITALS — BP 158/79 | HR 80 | Temp 98.0°F | Ht 62.0 in | Wt 163.7 lb

## 2011-05-05 DIAGNOSIS — L259 Unspecified contact dermatitis, unspecified cause: Secondary | ICD-10-CM

## 2011-05-05 MED ORDER — HYDROCORTISONE 2.5 % EX OINT: TOPICAL_OINTMENT | Freq: Two times a day (BID) | CUTANEOUS | Status: AC

## 2011-05-05 MED ORDER — PREDNISONE 50 MG PO TABS: ORAL_TABLET | ORAL | Status: DC

## 2011-05-05 NOTE — Patient Instructions (Signed)
I'm sorry you have poison oak.  Because the rash is in so many places, I want you to take an oral steroid for 5 days.  You can also use this prescription strength hydrocortisone cream.   Please make an appointment with Dr. Hulen Luster next month for follow up.

## 2011-05-07 NOTE — Progress Notes (Signed)
  Subjective:    Patient ID: [name], female    DOB: 1940/09/01, 70 y.o.   MRN: [id number]  HPI [name] comes in as she has had an exposure to poison oak. She says she has been trying to be more active lately, and says she has been working in her yard. She says she was pulling up a lot of complaint degrees, and came into contact with poison oak. She says this happened about 4 days ago. She says that she has scratched it and spread it from her arms to her abdomen. She says she issues throughout her chest and trunk. As well as on her arms.  She denies any fevers chills, any open sores or drainage.  She denies any dyspnea, sensation of throat swelling, cough.    Review of Systems See history of present illness.    Objective:   Physical Exam BP 158/79  Pulse 80  Temp(Src) 98 F (36.7 C) (Oral)  Ht 5\' 2"  (1.575 m)  Wt 163 lb 11.2 oz (74.254 kg)  BMI 29.94 kg/m2 General appearance: alert, cooperative and no distress Skin: Erythema and pustules on bilateral arms, excoriations on fore arms from scratching.  On chest: patient has fine erythematous papules about 2mm  On abdomen pt has pustules and erythema around umbilicus.         Assessment & Plan:

## 2011-05-07 NOTE — Assessment & Plan Note (Signed)
Poison Oak Exposure.  Pt with diffuse rash, will Rx prednisone PO and hydrocortisone ointment to help hasten resolution.

## 2011-05-11 ENCOUNTER — Emergency Department (HOSPITAL_COMMUNITY)
Admission: EM | Admit: 2011-05-11 | Discharge: 2011-05-11 | Disposition: A | Discharge status: Home / Self Care | Payer: Medicare Other | Attending: Emergency Medicine | Admitting: Emergency Medicine

## 2011-05-11 ENCOUNTER — Encounter (HOSPITAL_COMMUNITY): Payer: Self-pay

## 2011-05-11 DIAGNOSIS — R51 Headache: Secondary | ICD-10-CM | POA: Insufficient documentation

## 2011-05-11 DIAGNOSIS — Z9119 Patient's noncompliance with other medical treatment and regimen: Secondary | ICD-10-CM | POA: Insufficient documentation

## 2011-05-11 DIAGNOSIS — I10 Essential (primary) hypertension: Secondary | ICD-10-CM | POA: Insufficient documentation

## 2011-05-11 DIAGNOSIS — Z91199 Patient's noncompliance with other medical treatment and regimen due to unspecified reason: Secondary | ICD-10-CM | POA: Insufficient documentation

## 2011-05-11 DIAGNOSIS — Z9114 Patient's other noncompliance with medication regimen: Secondary | ICD-10-CM

## 2011-05-11 DIAGNOSIS — Z79899 Other long term (current) drug therapy: Secondary | ICD-10-CM | POA: Insufficient documentation

## 2011-05-11 DIAGNOSIS — I2510 Atherosclerotic heart disease of native coronary artery without angina pectoris: Secondary | ICD-10-CM | POA: Insufficient documentation

## 2011-05-11 HISTORY — DX: Essential (primary) hypertension: I10

## 2011-05-11 HISTORY — DX: Unspecified osteoarthritis, unspecified site: M19.90

## 2011-05-11 LAB — RETICULOCYTES: Retic Ct Pct: 3.2 % — ABNORMAL HIGH (ref 0.4–3.1)

## 2011-05-11 LAB — BASIC METABOLIC PANEL
BUN: 21 mg/dL (ref 6–23)
Chloride: 101 mEq/L (ref 96–112)
Creatinine, Ser: 0.95 mg/dL (ref 0.50–1.10)
GFR calc Af Amer: 70 mL/min — ABNORMAL LOW (ref >90)

## 2011-05-11 LAB — CBC
HCT: 32.4 % — ABNORMAL LOW (ref 36.0–46.0)
Hemoglobin: 10.4 g/dL — ABNORMAL LOW (ref 12.0–15.0)
MCH: 31 pg (ref 26.0–34.0)
MCHC: 32.1 g/dL (ref 30.0–36.0)
MCV: 96.4 fL (ref 78.0–100.0)

## 2011-05-11 LAB — VITAMIN B12: Vitamin B-12: 722 pg/mL (ref 211–911)

## 2011-05-11 LAB — DIFFERENTIAL
Basophils Relative: 1 % (ref 0–1)
Eosinophils Absolute: 0.4 10*3/uL (ref 0.0–0.7)
Eosinophils Relative: 4 % (ref 0–5)
Monocytes Absolute: 0.7 10*3/uL (ref 0.1–1.0)
Monocytes Relative: 8 % (ref 3–12)
Neutro Abs: 6.5 10*3/uL (ref 1.7–7.7)

## 2011-05-11 LAB — IRON AND TIBC
Saturation Ratios: 19 % — ABNORMAL LOW (ref 20–55)
TIBC: 370 ug/dL (ref 250–470)

## 2011-05-11 MED ORDER — CAPTOPRIL 6.25 MG HALF TABLET
6.2500 mg | ORAL_TABLET | Freq: Once | ORAL | Status: DC
  Filled 2011-05-11: qty 1

## 2011-05-11 NOTE — ED Provider Notes (Signed)
History     CSN: [id number] Arrival date & time: 05/11/2011 11:30 AM   First MD Initiated Contact with Patient 05/11/11 1139      Chief Complaint  Patient presents with  . Hypertension  . Headache    (Consider location/radiation/quality/duration/timing/severity/associated sxs/prior treatment) Patient is a 70 y.o. female presenting with hypertension. The history is provided by the patient and medical records.  Hypertension This is a recurrent problem. The current episode started more than 2 days ago. The problem occurs constantly. The problem has not changed since onset.Pertinent negatives include no chest pain, no abdominal pain, no headaches and no shortness of breath. Exacerbated by: Recent steroid prescription. The symptoms are relieved by medications (Antihypertensive medications have made for improved blood pressures, but still with persistent hypertension. The patient had not been taking her prescribed lisinopril 20 mg tablets until her pressure was high today prompting her to take it with improvement). Treatments tried: The patient reports that her blood pressure was as high as 210/107 earlier today, however after taking lisinopril and nitroglycerin (she had no chest pain), her blood pressure has improved but is still hypertensive. Improvement on treatment: The patient had tried to call her cardiologist to make an appointment for evaluation of hypertension, however they instructed her to go to the emergency Department to her high blood pressure.    Past Medical History  Diagnosis Date  . Hypertension   . Arthritis   . Asthma   . Blood transfusion   . Cancer   . Coronary artery disease     Past Surgical History  Procedure Date  . Abdominal hysterectomy     for concern for CA  . Abdominal surgery   . Cholecystectomy   . Appendectomy   . Coronary stent placement   . Ureteral stent placement     No family history on file.  History  Substance Use Topics  . Smoking  status: Former Games developer  . Smokeless tobacco: Never Used  . Alcohol Use: No    OB History    Grav Para Term Preterm Abortions TAB SAB Ect Mult Living                  Review of Systems  Constitutional: Negative for fever, chills, diaphoresis, activity change, appetite change, fatigue and unexpected weight change.  HENT: Negative for nosebleeds, congestion, sore throat, facial swelling, rhinorrhea, trouble swallowing, neck pain and postnasal drip.   Eyes: Negative for visual disturbance.  Respiratory: Negative for cough, chest tightness, shortness of breath and wheezing.   Cardiovascular: Negative for chest pain, palpitations and leg swelling.  Gastrointestinal: Negative for nausea, vomiting and abdominal pain.  Genitourinary: Negative.   Musculoskeletal: Negative.   Skin: Negative.   Neurological: Negative for dizziness, syncope, light-headedness, numbness and headaches.  Hematological: Bruises/bleeds easily.  Psychiatric/Behavioral: Negative.     Allergies  Codeine; Meloxicam; Rosuvastatin; and Sulfonamide derivatives  Home Medications   Current Outpatient Rx  Name Route Sig Dispense Refill  . BUDESONIDE 3 MG PO CP24 Oral Take 3 capsules (9 mg total) by mouth every morning. 90 capsule 0  . CITALOPRAM HYDROBROMIDE 20 MG PO TABS Oral Take 1 tablet (20 mg total) by mouth daily. 30 tablet 6    Brand only  . CLOPIDOGREL BISULFATE 75 MG PO TABS Oral Take 1 tablet (75 mg total) by mouth daily. 30 tablet 6  . COLESTIPOL HCL 1 G PO TABS Oral Take 1 g by mouth 3 (three) times daily.      Marland Kitchen  DIPHENOXYLATE-ATROPINE 2.5-0.025 MG PO TABS Oral Take 1 tablet by mouth 4 (four) times daily. For diarrhea 30 tablet 0  . ESOMEPRAZOLE MAGNESIUM 40 MG PO CPDR Oral Take 40 mg by mouth every morning before breakfast.      . FOLIC ACID 1 MG PO TABS Oral Take 1 tablet (1 mg total) by mouth 2 (two) times daily. 60 tablet 6  . HYDROCHLOROTHIAZIDE 25 MG PO TABS Oral Take 1 tablet (25 mg total) by mouth  daily. 30 tablet 6  . HYDROCORTISONE 2.5 % EX OINT Topical Apply topically 2 (two) times daily. Until rash resolved. 30 g 0  . HYDROXYZINE HCL 25 MG PO TABS Oral Take 25 mg by mouth every 8 (eight) hours as needed.      Marland Kitchen POLYSACCHARIDE IRON COMPLEX 150 MG PO CAPS Oral Take 1 capsule (150 mg total) by mouth 2 (two) times daily. 60 capsule 11    Dispense as written  . MELOXICAM 15 MG PO TABS Oral Take 15 mg by mouth daily.      Marland Kitchen DARPAZ 81 MG PO TABS Oral Take 1 tablet (81 mg total) by mouth 4 (four) times daily as needed. 120 tablet 3  . NEBIVOLOL HCL 5 MG PO TABS  Take 1/2 tab daily    . PHENAZOPYRIDINE HCL 200 MG PO TABS Oral Take 1 tablet (200 mg total) by mouth 3 (three) times daily as needed for pain. 90 tablet 11  . PREDNISONE 50 MG PO TABS  Take one tab by mouth daily for 5 days then stop. 5 tablet 0  . SINGULAIR 10 MG PO TABS  TAKE 1 TABLET BY MOUTH DAILY 30 tablet 0  . ZOSTER VACCINE LIVE 19400 UNT/0.65ML [place] SOLR Subcutaneous Inject 19,400 Units into the skin once. 1 vial 0    BP 189/80  Pulse 72  Temp(Src) 97.8 F (36.6 C) (Oral)  Resp 20  Ht 5\' 1"  (1.549 m)  Wt 160 lb (72.576 kg)  BMI 30.23 kg/m2  SpO2 96%  Physical Exam  Nursing note and vitals reviewed. Constitutional: She is oriented to person, place, and time. She appears well-developed and well-nourished. No distress.  HENT:  Head: Normocephalic and atraumatic.  Mouth/Throat: Oropharynx is clear and moist.  Eyes: Conjunctivae and EOM are normal. Pupils are equal, round, and reactive to light.  Fundoscopic exam:      The right eye shows no AV nicking, no exudate, no hemorrhage and no papilledema.       The left eye shows no AV nicking, no exudate, no hemorrhage and no papilledema.  Neck: Normal range of motion. Neck supple. No JVD present. No tracheal deviation present.  Cardiovascular: Normal rate, regular rhythm, normal heart sounds and intact distal pulses.   No extrasystoles are present. Exam reveals no gallop,  no S3, no S4 and no friction rub.   No murmur heard. Pulmonary/Chest: Breath sounds normal. No accessory muscle usage or stridor. Not tachypneic. No respiratory distress. She has no wheezes. She has no rales. She exhibits no tenderness.  Abdominal: Soft. Bowel sounds are normal. She exhibits no distension. There is no tenderness.  Musculoskeletal: Normal range of motion. She exhibits no edema and no tenderness.  Neurological: She is alert and oriented to person, place, and time. She has normal reflexes. No cranial nerve deficit. Coordination normal.  Skin: Skin is warm and dry. No rash noted. She is not diaphoretic. No erythema. No pallor.  Psychiatric: She has a normal mood and affect. Her behavior is normal.  Judgment and thought content normal.    ED Course  Procedures (including critical care time)   Labs Reviewed  CBC  DIFFERENTIAL  BASIC METABOLIC PANEL  PROTIME-INR  APTT   No results found.   No diagnosis found.    MDM  The patient appears to be hypertensive following initiation of steroids for contact with poison oak recently. She also was noncompliant with her antihypertensive medications, lisinopril 20 mg which she had not been taking until she took a dose today and has had some improvement in her blood pressure. If her blood pressure however, remains elevated, and I will treat her further with a dose of oral captopril in the ED. I do not see evidence of hypertensive crisis through any signs of organ failure. She is not having headache, is having no visual changes, is having no dyspnea, is having no chest pain, and shows no swelling of the lower extremities. I do anticipate this patient will be able to be discharged from the ED and will need to followup with her primary care or cardiologist for further management of her blood pressure medications.        Felisa Bonier, MD 05/11/11 [phone]

## 2011-05-11 NOTE — ED Notes (Signed)
Pt. States bp has been high several times recently, with intermittent headache; pt. Also states that she might have had a "light heart attack" this past Tuesday (2 days ago), but was not seen.  Tried to make an appointment today with [facility].  Rates ha a 1/10, and c/o blurred vision since yesterday.

## 2011-05-11 NOTE — ED Notes (Signed)
Patient stated that she wanted to see the MD, Fredricka Bonine about iron test and other blood test. MD notified

## 2011-05-11 NOTE — ED Notes (Signed)
Pt. Also states that she was given methylprednisolone injection for poison oak a couple of days ago, and prednisone tabs, as well.  Patient thinks that meds might have contributed to her bp problems.

## 2011-05-25 ENCOUNTER — Other Ambulatory Visit: Payer: Self-pay | Admitting: Family Medicine

## 2011-05-25 NOTE — Telephone Encounter (Signed)
Refill request

## 2011-05-26 ENCOUNTER — Telehealth: Payer: Self-pay | Admitting: *Deleted

## 2011-05-26 NOTE — Telephone Encounter (Signed)
Called patient and informed her that rx for Lomotil was ready to be picked up, patient states she wouldn't be back in town until middle of December and asked if rx could be mailed to her. Rx mailed to patient at [address] [employer] [place] [address].

## 2011-06-05 ENCOUNTER — Other Ambulatory Visit: Payer: Self-pay | Admitting: Family Medicine

## 2011-06-05 NOTE — Telephone Encounter (Signed)
Refill request

## 2011-06-06 ENCOUNTER — Other Ambulatory Visit: Payer: Self-pay | Admitting: Family Medicine

## 2011-06-06 MED ORDER — ESOMEPRAZOLE MAGNESIUM 40 MG PO CPDR: 40.0000 mg | DELAYED_RELEASE_CAPSULE | Freq: Every day | ORAL | Status: DC

## 2011-06-21 ENCOUNTER — Other Ambulatory Visit: Payer: Self-pay | Admitting: Family Medicine

## 2011-06-21 MED ORDER — MELOXICAM 15 MG PO TABS: 15.0000 mg | ORAL_TABLET | Freq: Every day | ORAL | Status: DC

## 2011-07-06 ENCOUNTER — Telehealth: Payer: Self-pay | Admitting: Family Medicine

## 2011-07-06 NOTE — Telephone Encounter (Signed)
Patient called back.  Informed patient that she will need an office visit before PCP can rx new med.  Saw Dr. Jethro Bolus last week for colitis and was given Rx for Keflex.  Thinks she has a yeast infection.  Has vaginal itching and burning with slight urinary leaking but denies vaginal discharge.  Denies odor or dysuria. Tried Monistat without relief.  [place]  Has appt with PCP on 07/26/11.  Wants to know if med can be called in for yeast infection.  Will route to PCP and call her back.  Gaylene Brooks, RN

## 2011-07-06 NOTE — Telephone Encounter (Signed)
Returned call to patient.  Left message to call our office back.  [place] Segler Ann, RN  

## 2011-07-06 NOTE — Telephone Encounter (Signed)
Pt states that the hydrocordisone cream is not working and she tried her friends Triamcinolone aceton 01% and it really helped.  Wants to know if she can have some of this called in.  Also has a yeast inf due to abx that she was given.  Wants to know if she can have something for this.  (she is at [facility]) [place] - [phone]

## 2011-07-07 ENCOUNTER — Other Ambulatory Visit: Payer: Self-pay | Admitting: Family Medicine

## 2011-07-07 NOTE — Telephone Encounter (Signed)
Refill request

## 2011-07-11 ENCOUNTER — Other Ambulatory Visit: Payer: Self-pay | Admitting: Family Medicine

## 2011-07-11 MED ORDER — TRIAMCINOLONE ACETONIDE 0.1 % EX CREA: TOPICAL_CREAM | Freq: Two times a day (BID) | CUTANEOUS | Status: DC

## 2011-07-11 MED ORDER — FLUCONAZOLE 150 MG PO TABS: 150.0000 mg | ORAL_TABLET | Freq: Once | ORAL | Status: AC

## 2011-07-11 NOTE — Telephone Encounter (Signed)
Dr. Hulen Luster prescribed RX for patient . Patient notified. I called both RX in to pharmacy phone  # patient left in last message at [facility] Xxx.

## 2011-07-26 ENCOUNTER — Ambulatory Visit: Payer: Medicare Other | Admitting: Family Medicine

## 2011-08-01 ENCOUNTER — Ambulatory Visit (INDEPENDENT_AMBULATORY_CARE_PROVIDER_SITE_OTHER): Payer: Medicare Other | Admitting: Family Medicine

## 2011-08-01 ENCOUNTER — Encounter: Payer: Self-pay | Admitting: Family Medicine

## 2011-08-01 VITALS — BP 158/82 | HR 74 | Temp 98.0°F | Ht 61.0 in | Wt 164.0 lb

## 2011-08-01 DIAGNOSIS — L259 Unspecified contact dermatitis, unspecified cause: Secondary | ICD-10-CM

## 2011-08-01 DIAGNOSIS — I10 Essential (primary) hypertension: Secondary | ICD-10-CM

## 2011-08-01 DIAGNOSIS — R062 Wheezing: Secondary | ICD-10-CM

## 2011-08-01 MED ORDER — MONTELUKAST SODIUM 10 MG PO TABS: 10.0000 mg | ORAL_TABLET | Freq: Every day | ORAL | Status: DC

## 2011-08-01 MED ORDER — DIPHENOXYLATE-ATROPINE 2.5-0.025 MG PO TABS: 1.0000 | ORAL_TABLET | Freq: Four times a day (QID) | ORAL | Status: DC | PRN

## 2011-08-01 MED ORDER — NEBIVOLOL HCL 5 MG PO TABS: 5.0000 mg | ORAL_TABLET | Freq: Every day | ORAL | Status: DC

## 2011-08-01 MED ORDER — ZOLPIDEM TARTRATE ER 12.5 MG PO TBCR: 12.5000 mg | EXTENDED_RELEASE_TABLET | Freq: Every evening | ORAL | Status: DC | PRN

## 2011-08-01 MED ORDER — MELOXICAM 15 MG PO TABS: 15.0000 mg | ORAL_TABLET | Freq: Every day | ORAL | Status: DC

## 2011-08-01 NOTE — Assessment & Plan Note (Signed)
Rash seen today likely related to use paper tape, which patient thinks she is allergic to. Advised to continue hydroxyzine when necessary itching and may use hydrocortisone topically.

## 2011-08-01 NOTE — Assessment & Plan Note (Signed)
Patient states that she cannot take any medications other than Bystolic because everything gives her a rash. I will increase Bystolic today to 5 mg. I plan to see her back in 2 weeks for blood pressure check.

## 2011-08-01 NOTE — Progress Notes (Signed)
  Subjective:    Patient ID: [name], female    DOB: [birth year], 71 y.o.   MRN: [id number]  HPI  Hypertension-patient has taken herself off of her HCTZ. She believes it makes her break out in the rash. She is only taking her bystolic. She denies headache, shortness of breath, chest pain, dizziness.  Rash-patient currently has a red itchy rash bilateral olecranon fossa. She states that she thinks this was started by paper tape. She thinks it is getting slightly better with her hydroxyzine and hydrocortisone cream.  Pneumonia-patient was seen at [facility] on 07/10/2011 and was diagnosed with pneumonia. Patient was given Levaquin to take but only took half of the dose because she thinks this made her break out in a rash. She states she is feeling better and denies fevers or cough. She does feel she is wheezing slightly. She attributes that to stay with her ex-husband and daughter who smokes.  Patient is a nonsmoker but is currently staying with her ex-husband and daughter who smoke Review of Systems Complains of loose stool x1 yesterday it was better with Lomotil    Objective:   Physical Exam  Vital signs reviewed General appearance - alert, well appearing, and in no distress and oriented to person, place, and time Heart - normal rate, regular rhythm, normal S1, S2, no murmurs, rubs, clicks or gallops Chest - slight wheeze right upper lung fields otherwise clear Skin-red macular rash with excoriation bilateral olecranon fossa       Assessment & Plan:

## 2011-08-01 NOTE — Patient Instructions (Signed)
I have refilled her Ambien today. Please increase your bystolic to 5 mg or one whole pill. Please call or be seen if your breathing gets any worse. Come back and see me in 2 weeks for your blood pressure check.

## 2011-08-01 NOTE — Assessment & Plan Note (Addendum)
I reviewed patient's notes from [facility] and I do not think she needs further treatment for this pneumonia. Patient with slight wheeze today. Likely secondary to smoke exposure where she is staying now. She has Symbicort at home. Advised her to return if wheezing does not improve or if breathing worsens at all. No Rales or rhonchi today. Pneumonia likely resolved.

## 2011-08-03 ENCOUNTER — Other Ambulatory Visit: Payer: Self-pay | Admitting: Family Medicine

## 2011-08-03 NOTE — Telephone Encounter (Signed)
Refill request

## 2011-08-22 ENCOUNTER — Encounter: Payer: Self-pay | Admitting: *Deleted

## 2011-08-22 ENCOUNTER — Ambulatory Visit: Payer: Medicare Other | Admitting: Family Medicine

## 2011-08-24 ENCOUNTER — Encounter: Payer: Self-pay | Admitting: Family Medicine

## 2011-08-24 ENCOUNTER — Ambulatory Visit (INDEPENDENT_AMBULATORY_CARE_PROVIDER_SITE_OTHER): Payer: Medicare Other | Admitting: Family Medicine

## 2011-08-24 VITALS — BP 174/77 | HR 94 | Temp 97.7°F | Ht 61.0 in | Wt 165.0 lb

## 2011-08-24 DIAGNOSIS — B372 Candidiasis of skin and nail: Secondary | ICD-10-CM | POA: Insufficient documentation

## 2011-08-24 MED ORDER — NYSTATIN-TRIAMCINOLONE 100000-0.1 UNIT/GM-% EX OINT: TOPICAL_OINTMENT | Freq: Two times a day (BID) | CUTANEOUS | Status: DC

## 2011-08-24 NOTE — Progress Notes (Signed)
  Subjective:    Patient ID: [name], female    DOB: [birth year], 71 y.o.   MRN: [id number]  HPI Rash-patient has a red itchy rash under her left breast. This has been there for a week. She has not tried any creams on it. She's not anything to make it better or worse. She also has a rash on her left thumb. This initially swelled up and was weeping. It is very itchy and she has been using a washcloth to scrub the area.  She denies fevers.  Of note this patient frequently states that medications cause her rashes. However, she has several medications at home that she's not currently taking but will try occasionally. She changes her medication regimen fairly often. This skin irritation may be due to drug reaction but it is very hard to tell what has been recently started. Review of Systems See above    Objective:   Physical Exam Vital signs reviewed General appearance - alert, well appearing, and in no distress and oriented to person, place, and time Skin-there is a red, indurated, papular area under her left breast. This is in the intertriginous space. There are some satellite regions present. Her left thumb at the base has a dry, cracked appearance. It is slightly swollen compared to the some. The skin is peeling. It looks excoriated       Assessment & Plan:

## 2011-08-24 NOTE — Patient Instructions (Signed)
Please come back and see me in one week Please make an appointment to see Dr. Raymondo Band on the same day if possible Bring every medicine that you have in your house  For the rash, please apply the cream. Please put Vaseline on her thumb several times a day Try not to scratch

## 2011-08-24 NOTE — Assessment & Plan Note (Signed)
Patient with very suspicious rash under her breasts for Candida. Very inflamed at the moment. Will try Mycolog cream. Patient also has a rash and skin irritation of her left thumb. This rash is either due to her eczema or to fungus. I asked her to also put Mycolog cream for this twice a day. I've asked her to put Vaseline on this continuously to keep it moist. I will see her back in a week to see if this is making any improvement.

## 2011-08-25 ENCOUNTER — Telehealth: Payer: Self-pay | Admitting: *Deleted

## 2011-08-25 NOTE — Telephone Encounter (Signed)
This encounter was created in error - please disregard.

## 2011-08-25 NOTE — Telephone Encounter (Signed)
Patient has follow-up appt with Dr. Corliss Skains for hip pain.  Patient [place] medicaid and they need NPI # to authorize visit.  Info given.  Gaylene Brooks, RN

## 2011-08-28 ENCOUNTER — Ambulatory Visit: Payer: Medicare Other | Admitting: Family Medicine

## 2011-08-29 ENCOUNTER — Encounter: Payer: Self-pay | Admitting: Family Medicine

## 2011-10-31 ENCOUNTER — Ambulatory Visit: Payer: Medicare Other | Admitting: Family Medicine

## 2011-11-03 ENCOUNTER — Other Ambulatory Visit: Payer: Self-pay | Admitting: Family Medicine

## 2011-11-03 DIAGNOSIS — Z1231 Encounter for screening mammogram for malignant neoplasm of breast: Secondary | ICD-10-CM

## 2011-12-04 ENCOUNTER — Ambulatory Visit: Payer: Medicare Other

## 2011-12-05 ENCOUNTER — Encounter: Payer: Self-pay | Admitting: Family Medicine

## 2011-12-05 DIAGNOSIS — R69 Illness, unspecified: Secondary | ICD-10-CM | POA: Insufficient documentation

## 2011-12-06 ENCOUNTER — Ambulatory Visit (INDEPENDENT_AMBULATORY_CARE_PROVIDER_SITE_OTHER): Payer: Medicare Other | Admitting: Family Medicine

## 2011-12-06 ENCOUNTER — Telehealth: Payer: Self-pay | Admitting: *Deleted

## 2011-12-06 ENCOUNTER — Encounter: Payer: Self-pay | Admitting: Family Medicine

## 2011-12-06 VITALS — BP 168/77 | HR 79 | Temp 98.0°F | Ht 61.0 in | Wt 157.0 lb

## 2011-12-06 DIAGNOSIS — L738 Other specified follicular disorders: Secondary | ICD-10-CM

## 2011-12-06 DIAGNOSIS — B372 Candidiasis of skin and nail: Secondary | ICD-10-CM

## 2011-12-06 DIAGNOSIS — J309 Allergic rhinitis, unspecified: Secondary | ICD-10-CM

## 2011-12-06 DIAGNOSIS — R062 Wheezing: Secondary | ICD-10-CM

## 2011-12-06 MED ORDER — FLUCONAZOLE 150 MG PO TABS: 150.0000 mg | ORAL_TABLET | ORAL | Status: AC

## 2011-12-06 MED ORDER — NYSTATIN 100000 UNIT/GM EX CREA: TOPICAL_CREAM | Freq: Two times a day (BID) | CUTANEOUS | Status: DC

## 2011-12-06 MED ORDER — BUDESONIDE-FORMOTEROL FUMARATE 160-4.5 MCG/ACT IN AERO: 2.0000 | INHALATION_SPRAY | Freq: Two times a day (BID) | RESPIRATORY_TRACT | Status: DC

## 2011-12-06 MED ORDER — SINGULAIR 10 MG PO TABS: 10.0000 mg | ORAL_TABLET | Freq: Every day | ORAL | Status: DC

## 2011-12-06 NOTE — Telephone Encounter (Addendum)
PA required for Singulair. Form placed in MD box. 

## 2011-12-06 NOTE — Progress Notes (Signed)
  Subjective:    Patient ID: [name], female    DOB: 1940-11-10, 71 y.o.   MRN: [id number]  HPI  Patient comes in today for review of multiple symptoms. Skin rash-patient has some skin rash on her left elbow area as well as under her breasts. This did not get better with Mycolog but didn't get better with some nystatin cream that she borrowed from a friend. She would like to refill the nystatin cream and keep using this. She thinks that this is related to her taking tramadol. She thinks it is caused her to break out in a rash. She has not been tested for allergies to tramadol or other medications.  Wheezing-patient states that she was given Symbicort by an allergist for wheezing. She says that she only uses this when she gets congested and has trouble breathing. She is not currently having trouble but would like a refill of the Symbicort. She is not using any albuterol.  Review of Systems No cough, shortness of breath, wheezing today. No fevers, chills, diarrhea, nausea vomiting.    Objective:   Physical Exam  Vital signs reviewed General appearance - alert, well appearing, and in no distress' Heart - normal rate, regular rhythm, normal S1, S2, no murmurs, rubs, clicks or gallops Chest - clear to auscultation, no wheezes, rales or rhonchi, symmetric air entry, no tachypnea, retractions or cyanosis Skin-there is a dry, excoriated, red flat rash in the right elbow.      Assessment & Plan:

## 2011-12-06 NOTE — Assessment & Plan Note (Signed)
Patient states that her rash is better with nystatin cream that was with the Mycolog cream. I will refill her nystatin cream. I've also given her 3 weeks of Diflucan to take 1 per week instead of the nystatin cream for now. She will follow up with a dermatologist. I have put in a referral today.

## 2011-12-06 NOTE — Patient Instructions (Addendum)
I will send in a referral for dermatology for you Please followup with your other doctors Please come back for a followup appointment in 3 months or sooner if you need [place]

## 2011-12-06 NOTE — Assessment & Plan Note (Signed)
Patient states she's been given Symbicort by her allergist and would like a refill of this. She says she uses it when she has wheezes. She is not have any wheezes today but I will refill this medicine for her. I asked her to speak to her allergist about this as well.

## 2011-12-07 NOTE — Telephone Encounter (Signed)
Pharmacy notified.

## 2011-12-07 NOTE — Telephone Encounter (Signed)
Request for Singulair has been approved for 1 year effective today.

## 2012-01-02 ENCOUNTER — Other Ambulatory Visit: Payer: Self-pay | Admitting: Family Medicine

## 2012-01-15 ENCOUNTER — Encounter: Payer: Self-pay | Admitting: Family Medicine

## 2012-01-15 DIAGNOSIS — M25569 Pain in unspecified knee: Secondary | ICD-10-CM

## 2012-01-15 DIAGNOSIS — M25559 Pain in unspecified hip: Secondary | ICD-10-CM

## 2012-01-15 NOTE — Assessment & Plan Note (Signed)
Documentation. Injected did not help. Doing exercises.

## 2012-01-30 ENCOUNTER — Other Ambulatory Visit: Payer: Self-pay | Admitting: Family Medicine

## 2012-02-06 ENCOUNTER — Telehealth: Payer: Self-pay | Admitting: *Deleted

## 2012-02-06 NOTE — Telephone Encounter (Signed)
Para March, that is fine if she wants to see the GI specialist.  Thank you for asking.

## 2012-02-06 NOTE — Telephone Encounter (Signed)
Office calling to request NPI # for upcoming GI appt with Dr. Ewing Schlein on 02/09/12 for diarrhea, yeast, and Diflucan.  No recent referral and last office visit here was 11/2011.  Will check with Dr. Madolyn Frieze for approval and call their office back.  Gaylene Brooks, RN

## 2012-02-08 NOTE — Telephone Encounter (Signed)
Returned call to [facility] and gave NPI #.  Gaylene Brooks, RN

## 2012-02-14 ENCOUNTER — Encounter: Payer: Self-pay | Admitting: Family Medicine

## 2012-02-14 DIAGNOSIS — I2510 Atherosclerotic heart disease of native coronary artery without angina pectoris: Secondary | ICD-10-CM

## 2012-02-14 DIAGNOSIS — R062 Wheezing: Secondary | ICD-10-CM

## 2012-02-14 DIAGNOSIS — I701 Atherosclerosis of renal artery: Secondary | ICD-10-CM

## 2012-02-14 NOTE — Assessment & Plan Note (Addendum)
Documentation only. Appointment 08/13. New meds: BASA, losartan 50, Crestor 10. Continue Lasix 20 and potassium 10 and Bystolic 2.5. Lexiscan myoview ordered.

## 2012-02-14 NOTE — Assessment & Plan Note (Signed)
Documentation only. Allergy visit 08/12. Diagnosed with acute sinusitis (azithromycin x 5 days), urticaria (Zyrtec, hydroxyzine), extrinsic asthma (continue Symbicort, start albuterol), allergic rhinitis (stop Nasonex, continue Patanase, start Qnasl)

## 2012-02-15 ENCOUNTER — Encounter: Payer: Self-pay | Admitting: Family Medicine

## 2012-02-15 ENCOUNTER — Ambulatory Visit (INDEPENDENT_AMBULATORY_CARE_PROVIDER_SITE_OTHER): Payer: Medicare Other | Admitting: Family Medicine

## 2012-02-15 VITALS — BP 199/95 | HR 71 | Temp 98.1°F | Ht 61.0 in | Wt 160.0 lb

## 2012-02-15 DIAGNOSIS — K529 Noninfective gastroenteritis and colitis, unspecified: Secondary | ICD-10-CM

## 2012-02-15 DIAGNOSIS — R197 Diarrhea, unspecified: Secondary | ICD-10-CM

## 2012-02-15 LAB — CBC WITH DIFFERENTIAL/PLATELET
Eosinophils Absolute: 0.4 10*3/uL (ref 0.0–0.7)
Eosinophils Relative: 8 % — ABNORMAL HIGH (ref 0–5)
Hemoglobin: 12.2 g/dL (ref 12.0–15.0)
Lymphs Abs: 1.9 10*3/uL (ref 0.7–4.0)
MCH: 30.3 pg (ref 26.0–34.0)
MCV: 90.6 fL (ref 78.0–100.0)
Monocytes Absolute: 0.5 10*3/uL (ref 0.1–1.0)
Monocytes Relative: 9 % (ref 3–12)
Platelets: 331 10*3/uL (ref 150–400)
RBC: 4.03 MIL/uL (ref 3.87–5.11)

## 2012-02-15 LAB — COMPREHENSIVE METABOLIC PANEL
BUN: 31 mg/dL — ABNORMAL HIGH (ref 6–23)
CO2: 21 mEq/L (ref 19–32)
Creat: 2.32 mg/dL — ABNORMAL HIGH (ref 0.50–1.10)
Glucose, Bld: 117 mg/dL — ABNORMAL HIGH (ref 70–99)
Total Bilirubin: 0.4 mg/dL (ref 0.3–1.2)

## 2012-02-15 NOTE — Progress Notes (Signed)
  Subjective:    Patient ID: [name], female    DOB: 1941/04/23, 71 y.o.   MRN: [id number]  HPI # She thinks she has yeast in her blood  She has chronic diarrhea but when she takes Diflucan, the diarrhea resolves  She had pneumonia and was seen a [facility] this past winter. They diagnosed her with yeast in her bloodstream, and she took Diflucan at that time.   She thinks she has yeast in her blood now and wants a referral to Immunology at [facility] because she thinks she has an immune disorder and Dr. Ewing Schlein is recommending she be referred.   Currently her symptoms include dryness on her left thumb and vaginal irritation. She took one diflucan yesterday and the vaginal irritation resolved and the dryness on her left thumb is improving.   Review of Systems Denies fevers/chills.  Denies nausea/vomiting. She is tolerating her diet without problem.  Denies diarrhea at this time. She says when she gets diarrhea her stool is watery and not just loose. The diarrhea started in 2006.   Allergies, medication, past medical history reviewed.  Significant for: -Depression, sleep disorder -HTN, HLD -CAD, subclavian steal syndrome, RAS -GERD, IBS -Stress incontinence -Osteoarthritis hips and knees -Chronic diarrhea -History of bowel strangulation and s/p partial colectomy 2007     Objective:   Physical Exam GEN: NAD PSYCH: not depressed or anxious-appearing; tangential thoughts; she is directable; loquacious SKIN: dry scaly area on ventral aspect of left thumb   No other rashes    Assessment & Plan:

## 2012-02-15 NOTE — Patient Instructions (Addendum)
Follow-up with me in 1 week to discuss your lab results and diarrhea

## 2012-02-15 NOTE — Assessment & Plan Note (Signed)
This is a patient who has a history of bowel strangulation in 2007 and is s/p partial colectomy. She is followed by Dr. Ewing Schlein.  She comes in today presenting with concern for yeast in her blood. She was diagnosed with this maybe during a hospitalization for pneumonia this past winter, but it is unclear whether this was the actual diagnosis. She is not a completely reliable historian. Her only symptom now is what appears to be an eczematous rash on her left thumb.   I spoke with Dr. Ewing Schlein about this patient since she mentioned his requesting her being referral to an immunologist due to her persistent yeast infection. He concurs that she is not a thoroughly reliable historian. She probably has diarrhea due to her being s/p colectomy. She is convinced that the diarrhea is from a yeast infection because whenever she takes Diflucan, the diarrhea resolves. He thinks that the Diflucan has a probiotic-like affect on her colon that may be helping the diarrhea. He is not recommending chronic Diflucan and has tried her on other medications such as Nystatin, which may be used more chronically, however, her symptoms are not alleviated by Nystatin. He recommended possibly having her see a holistic physician for other recommendations regarding her chronic diarrhea.   I think it is unlikely she has a yeast bacteremia, however, per patient request, we will check blood cultures. We will also check CBC with difficile and CMET due to her chronic diarrhea.  The patient will follow-up in 1 month to discuss results and further management recommendations. Significant adverse affects of Diflucan include hepatotoxicity, QT-prolonged (especially with her history of CAD and other cardiac disease), angioedema. Patient's colonic flora will also be affected. We will discuss the risks and benefits about whether it would be appropriate for the patient to use Diflucan intermittently (a couple of times a month) since she feels it has such  positive benefits.

## 2012-02-16 ENCOUNTER — Telehealth: Payer: Self-pay | Admitting: Family Medicine

## 2012-02-16 DIAGNOSIS — I10 Essential (primary) hypertension: Secondary | ICD-10-CM

## 2012-02-16 MED ORDER — LOSARTAN POTASSIUM 100 MG PO TABS: 100.0000 mg | ORAL_TABLET | Freq: Every day | ORAL | Status: DC

## 2012-02-16 MED ORDER — NEBIVOLOL HCL 5 MG PO TABS: 5.0000 mg | ORAL_TABLET | Freq: Every day | ORAL | Status: DC

## 2012-02-16 NOTE — Telephone Encounter (Signed)
Acute renal insufficiency. Cr markedly elevated at 2.32. Normally WNL (0.7-0.95) D/Dx:   Pre-renal: diarrhea, dehydration>>>     -Advised to stay well hydrated and take Imodium as needed for diarrhea     -Hold HCTZ. She has not been taking.      -Do not take Lasix. She takes it intermittently, last time 1 week ago and then yesterday after she came to clinic   Intrinsic:      -Will try to get better blood pressure control by increasing Bystolic from 2.5 to 5 mg     -Will increase losartan from 50 to 100   She will be in [place], [place] for 3 weeks and will be seeing Baldemar Friday (PA) for her medical care. She works with Dr. Lodema Hong and Dr. Tilden Dome.  She has an appointment on Wednesday 08/28 at 2:30 pm.  Phone number: [phone]>>>Fax: [phone]  I have asked her to have check her creatinine sometime next week August 29-30   BLUE TEAM: -Will you please fax my office visit note from yesterday, this telephone note, a copy of yesterday's labs, and a letter I am about to write and forward to you to the number in bold above? Thank you.

## 2012-02-16 NOTE — Telephone Encounter (Signed)
Pharmacy resident brought to my attention that [name]'s losartan had been increased, despite doubling of creatinine and concern for pre-renal deficits resulting in increased creatinine.  Patient also has diagnosis of renal artery stenosis on her problem list, making ACE/ARBs worrisome as treatment.    - Discussed with clinic staff to give patient a call and tell her to stop the losartan.   - She is continue the Bystolic.   - FU early next week for blood pressure check as she has isolated systolic of 190.  I do not see that this was rechecked while she was in clinic.

## 2012-02-16 NOTE — Telephone Encounter (Signed)
Faxed. Fleeger, [name]  

## 2012-02-16 NOTE — Telephone Encounter (Signed)
Contacted pt, she is reluctant to stop her meds since her BP was so high.  Attempted to explain to her about her kidney function.  When I asked if she would come into have a BP check, she states that she will be going to [place] on Wednesday for a MD appt.  Spoke with Dr. Gwendolyn Grant and he is ok with that.  Also pt was at Dr. Alanda Amass office (cardiologist) and they request that we fax last OVN and Labs to them now.  Faxed to [phone]. Ameya Kutz, [name]

## 2012-03-01 ENCOUNTER — Other Ambulatory Visit: Payer: Self-pay | Admitting: Family Medicine

## 2012-03-11 ENCOUNTER — Encounter: Payer: Self-pay | Admitting: Family Medicine

## 2012-03-11 ENCOUNTER — Ambulatory Visit (INDEPENDENT_AMBULATORY_CARE_PROVIDER_SITE_OTHER): Payer: Medicare Other | Admitting: Family Medicine

## 2012-03-11 VITALS — BP 160/82 | HR 77 | Temp 97.8°F | Ht 61.0 in | Wt 157.0 lb

## 2012-03-11 DIAGNOSIS — N289 Disorder of kidney and ureter, unspecified: Secondary | ICD-10-CM | POA: Insufficient documentation

## 2012-03-11 DIAGNOSIS — R197 Diarrhea, unspecified: Secondary | ICD-10-CM

## 2012-03-11 DIAGNOSIS — E7800 Pure hypercholesterolemia, unspecified: Secondary | ICD-10-CM

## 2012-03-11 DIAGNOSIS — I10 Essential (primary) hypertension: Secondary | ICD-10-CM

## 2012-03-11 DIAGNOSIS — K529 Noninfective gastroenteritis and colitis, unspecified: Secondary | ICD-10-CM

## 2012-03-11 MED ORDER — LOSARTAN POTASSIUM 50 MG PO TABS: 50.0000 mg | ORAL_TABLET | Freq: Every day | ORAL | Status: DC

## 2012-03-11 NOTE — Assessment & Plan Note (Signed)
She does not want to take a statin at this time. She would like to try losing weight.

## 2012-03-11 NOTE — Patient Instructions (Addendum)
START losartan 50 mg daily for your blood pressure  Follow-up in 1-2 weeks to re-check your blood pressure and kidney labs

## 2012-03-11 NOTE — Assessment & Plan Note (Signed)
Diflucan gives her significant improvement in symptoms. She takes it intermittently for 1-2 days when she has episodes. I will refill 30 tablets; she was advised to follow-up for any more refills.

## 2012-03-11 NOTE — Assessment & Plan Note (Signed)
Resolved. Her Cr went up to 2 at her last visit. Her losartan was discontinued at that time. Her Cr is now 0.91. I think it is unlikely the medication by itself accounted for her renal failure. It may have been the medication and her diarrhea. We will re-start losartan at 50 mg, check her Cr today, and re-check in 1-2 weeks.

## 2012-03-11 NOTE — Progress Notes (Signed)
  Subjective:    Patient ID: [name], female    DOB: [birth year], 71 y.o.   MRN: [id number]  HPI # Hypertension  She is compliant with nebivolol.  She denies chest pain or dyspnea.   # Acute renal failure--resolved  Cr last month at other doctor's office was 0.91. She brought results with her today.   # Chronic intermittent diarrhea Taking Diflucan for 1-2 days during episodes helps her significantly.   Review of Systems Per HPI    Objective:   Physical Exam GEN: NAD CV: RRR, no murmurs PULM: NI WOB; CTAB without w/r/r    Assessment & Plan:

## 2012-03-11 NOTE — Assessment & Plan Note (Signed)
Elevated today.  -Continue nebivolol 5 mg -Start losartan 50 mg. Will check Cr today and in 1-2 weeks to monitor for acute renal failure.

## 2012-03-11 NOTE — Progress Notes (Signed)
  Subjective:    Patient ID: [name], female    DOB: 01-19-1941, 71 y.o.   MRN: [id number]  HPI Patient arrives in good spirits for medication review.  She brings all her medications to clinic with her.  Patient asked [place] to discard meloxicam, budesonide tablets, tramadol, and Lyrica.  Meds were sent for destruction.   Review of Systems     Objective:   Physical Exam        Assessment & Plan:  Reviewed all medications with patient.  Discussed her concerns regarding losartan and fluconazole.  Pt has had previous courses of fluconazole and comes to clinic requesting a refill for another 30 day course since she still thinks she "fungus in her blood".  Deferred to Dr. Madolyn Frieze.  She was also recently taken off losartan 100 mg due to a spike in her SCr to 2.34.  However, since her SCr has since normalized to 0.91 and she was previously dehydrated due to severe diarrhea, it is reasonable to re-initiate losartan 50 mg daily in this patient with close follow-up.  Discussed these concerns with Dr. Madolyn Frieze who also saw pt in clinic today.  Total time in face-to-face counseling 30 min.  Patient seen with Allene Dillon, PharmD Candidate.Marland Kitchen

## 2012-03-12 ENCOUNTER — Encounter: Payer: Self-pay | Admitting: Family Medicine

## 2012-03-12 LAB — BASIC METABOLIC PANEL
CO2: 24 mEq/L (ref 19–32)
Calcium: 9.8 mg/dL (ref 8.4–10.5)
Creat: 0.97 mg/dL (ref 0.50–1.10)
Glucose, Bld: 92 mg/dL (ref 70–99)

## 2012-03-13 ENCOUNTER — Telehealth: Payer: Self-pay | Admitting: Family Medicine

## 2012-03-13 NOTE — Telephone Encounter (Signed)
Dr Madolyn Frieze was supposed to refill her Diflucan on Monday and pharmacy still doesn't have it.  [facility] - [address]

## 2012-03-14 MED ORDER — FLUCONAZOLE 150 MG PO TABS: 150.0000 mg | ORAL_TABLET | Freq: Every day | ORAL | Status: DC | PRN

## 2012-03-14 NOTE — Telephone Encounter (Signed)
Rx sent. Please notify patient and will you please tell the patient I am sorry for not sending it it sooner? Thank you Angelique Blonder.

## 2012-03-16 ENCOUNTER — Other Ambulatory Visit: Payer: Self-pay | Admitting: Family Medicine

## 2012-03-21 HISTORY — PX: DOPPLER ECHOCARDIOGRAPHY: SHX263

## 2012-03-21 HISTORY — PX: CARDIOVASCULAR STRESS TEST: SHX262

## 2012-04-02 ENCOUNTER — Encounter: Payer: Self-pay | Admitting: Family Medicine

## 2012-04-02 DIAGNOSIS — I2510 Atherosclerotic heart disease of native coronary artery without angina pectoris: Secondary | ICD-10-CM

## 2012-04-19 ENCOUNTER — Ambulatory Visit: Payer: Medicare Other | Admitting: Family Medicine

## 2012-04-24 ENCOUNTER — Ambulatory Visit: Payer: Medicare Other | Admitting: Family Medicine

## 2012-04-29 ENCOUNTER — Encounter: Payer: Self-pay | Admitting: Family Medicine

## 2012-04-29 ENCOUNTER — Telehealth: Payer: Self-pay | Admitting: *Deleted

## 2012-04-29 ENCOUNTER — Ambulatory Visit (INDEPENDENT_AMBULATORY_CARE_PROVIDER_SITE_OTHER): Payer: Medicare Other | Admitting: Family Medicine

## 2012-04-29 ENCOUNTER — Ambulatory Visit: Payer: Medicare Other | Admitting: Family Medicine

## 2012-04-29 VITALS — BP 152/82 | HR 84 | Ht 61.0 in | Wt 155.0 lb

## 2012-04-29 DIAGNOSIS — R21 Rash and other nonspecific skin eruption: Secondary | ICD-10-CM | POA: Insufficient documentation

## 2012-04-29 DIAGNOSIS — F32A Depression, unspecified: Secondary | ICD-10-CM

## 2012-04-29 DIAGNOSIS — F329 Major depressive disorder, single episode, unspecified: Secondary | ICD-10-CM

## 2012-04-29 DIAGNOSIS — F339 Major depressive disorder, recurrent, unspecified: Secondary | ICD-10-CM

## 2012-04-29 DIAGNOSIS — R197 Diarrhea, unspecified: Secondary | ICD-10-CM

## 2012-04-29 DIAGNOSIS — F3289 Other specified depressive episodes: Secondary | ICD-10-CM

## 2012-04-29 DIAGNOSIS — K529 Noninfective gastroenteritis and colitis, unspecified: Secondary | ICD-10-CM

## 2012-04-29 MED ORDER — CITALOPRAM HYDROBROMIDE 20 MG PO TABS: 20.0000 mg | ORAL_TABLET | Freq: Every day | ORAL | Status: DC

## 2012-04-29 MED ORDER — FLUCONAZOLE 150 MG PO TABS: 150.0000 mg | ORAL_TABLET | Freq: Every day | ORAL | Status: DC | PRN

## 2012-04-29 MED ORDER — LOSARTAN POTASSIUM 50 MG PO TABS: 50.0000 mg | ORAL_TABLET | Freq: Every day | ORAL | Status: DC

## 2012-04-29 NOTE — Patient Instructions (Addendum)
Start Celexa  Re-start losartan 50 mg daily  Follow-up in 1-2 weeks

## 2012-04-29 NOTE — Assessment & Plan Note (Signed)
Since she feels like Diflucan provides significant benefit for diarrhea, will refill.  Things to monitor for: liver function, renal function, QT prolongation, drug interactions (affects cytochrome P450)

## 2012-04-29 NOTE — Assessment & Plan Note (Signed)
Rash on her ear lobes. Appears to be consistent with early/mild contact dermatitis but patient is convinced it is due to fungal infection and is taking Diflucan. She does not want any other treatment at this time including emollients or other creams.

## 2012-04-29 NOTE — Progress Notes (Signed)
  Subjective:    Patient ID: [name], female    DOB: 05-Dec-1940, 71 y.o.   MRN: [id number]  HPI # She thinks she is depressed and would like to start Celexa again.  She had been on it in the past but stopped last winter, but she has been feeling tired and wanting to sleep all the time and would like to re-start it.  ROS: endorses difficulty concentrating and feeling "slow" and "sad"; denies change in appetite or SI/HI   Other mediations tried:   Cymbalta (bad dreams)    Prozac/Paxil/Lexapro/Wellbutrin/Effexor (did not help)  # She would like a refill on Diflucan. She takes it about 15/30 days of the month when she feels a fungal infection coming it. It manifests as diarrhea or rash. Currently, she has a rash on her ear lobes bilaterally and says it is due to the fungal infection. She denies wearing jewelry or other items/lotions/soaps/items that could possibly irritate it. ROS: denies fevers/chills; denies diarrhea at this time  Review of Systems Per HPI Denies abdominal pain, chest pain  Allergies, medication, past medical history reviewed.      Objective:   Physical Exam GEN: NAD PSYCH: appropriate to questions, not anxious or depressed appearing; alert and oriented; good eye contact; does not appear manic SKIN: erythematous scaly rash on her ear lobes and immediately behind ears that is non-weeping, non-tender, non-warm  PHQ-9 26/27, extremely difficult     Assessment & Plan:

## 2012-04-29 NOTE — Assessment & Plan Note (Addendum)
She has a history of depression and seems to have an exacerbation at this time. PH9-Q 26/27.  Celexa (brand name) has worked well for her in the past and so we will re-start. We will check TSH>>>normal.

## 2012-04-29 NOTE — Telephone Encounter (Addendum)
Insurance Company [employer] was notified regarding sending a form for MD to complete for PA for brand named Celexa.  Form was faxed but  the insurance company called this afternoon stating it has already been approved.

## 2012-05-22 ENCOUNTER — Ambulatory Visit: Payer: Medicare Other | Admitting: Family Medicine

## 2012-05-28 ENCOUNTER — Other Ambulatory Visit: Payer: Self-pay | Admitting: Family Medicine

## 2012-06-06 ENCOUNTER — Ambulatory Visit (INDEPENDENT_AMBULATORY_CARE_PROVIDER_SITE_OTHER): Payer: Medicare Other | Admitting: Family Medicine

## 2012-06-06 ENCOUNTER — Encounter: Payer: Self-pay | Admitting: Family Medicine

## 2012-06-06 VITALS — BP 185/79 | HR 82 | Temp 97.8°F | Ht 61.0 in | Wt 151.0 lb

## 2012-06-06 DIAGNOSIS — I10 Essential (primary) hypertension: Secondary | ICD-10-CM

## 2012-06-06 DIAGNOSIS — R197 Diarrhea, unspecified: Secondary | ICD-10-CM

## 2012-06-06 DIAGNOSIS — K529 Noninfective gastroenteritis and colitis, unspecified: Secondary | ICD-10-CM

## 2012-06-06 MED ORDER — MELOXICAM 15 MG PO TABS: 15.0000 mg | ORAL_TABLET | Freq: Every day | ORAL | Status: DC

## 2012-06-06 MED ORDER — ESOMEPRAZOLE MAGNESIUM 40 MG PO CPDR: 40.0000 mg | DELAYED_RELEASE_CAPSULE | Freq: Every day | ORAL | Status: DC

## 2012-06-06 MED ORDER — LACTINEX PO CHEW: 1.0000 | CHEWABLE_TABLET | Freq: Three times a day (TID) | ORAL | Status: DC

## 2012-06-06 MED ORDER — ZOLPIDEM TARTRATE ER 12.5 MG PO TBCR: 12.5000 mg | EXTENDED_RELEASE_TABLET | Freq: Every evening | ORAL | Status: DC | PRN

## 2012-06-06 MED ORDER — HYDROXYZINE HCL 10 MG PO TABS: 10.0000 mg | ORAL_TABLET | Freq: Every day | ORAL | Status: DC

## 2012-06-06 MED ORDER — CLOTRIMAZOLE-BETAMETHASONE 1-0.05 % EX CREA: TOPICAL_CREAM | Freq: Two times a day (BID) | CUTANEOUS | Status: DC

## 2012-06-06 NOTE — Progress Notes (Signed)
  Subjective:    Patient ID: [name], female    DOB: 25-Jan-1941, 71 y.o.   MRN: [id number]  HPI 71 y.o. female with worsening chronic diarrhea. Traveled to [facility] early November, no camping or questionable meals but diarrhea became worse shortly after returning. States prior to [place] trip she was having normal, formed stools. She has watery diarrhea several times a day and 3-4 times last night. Cramping after she eats. Occasional episode of incontinence of stool. No blood or mucous. Eating tends to cause diarrhea. States she has lost 4 lbs since November (155-151). No fever but had chills yesterday. Does have intermittent nausea, no vomiting.   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 Has had chronic diarrhea since. Also states she has colitis. Had colonoscopy in 12/2010 (Dr. Ewing Schlein, GI), tortuous spastic sigmoid, normal colon to anastomosis. 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   She states that Diflucan seems to help her diarrhea so she takes it intermittently when she has a flare-up. This was approved by her PCP in September and again in November. She last saw her PCP 11/4 with complaint of diarrhea. She also takes colestipol which she recently increased from 3 to 4 packets a day.  And lomotil (she took 2 yesterday(.  Pt also c/o cough with some yellow phlegm for 1 day.   Pt's blood pressure is high today, but she has not taken her Bystolic yet. She had added Losartan to her BP meds but had an episode of acute renal failure. The medicine was stopped and then restarted at lower dose. However, pt states that Losartan causes her throat to swell and makes her choke so she is no longer taking it.   NOTE:  History limited by patient being very poor historian and easily  distracted.  Review of Systems  Constitutional: Positive for chills. Negative for fever.  Respiratory: Positive for cough. Negative for shortness of breath.   Gastrointestinal: Positive for nausea, abdominal pain and diarrhea. Negative for vomiting and blood in stool.  Genitourinary: Negative for dysuria and difficulty urinating.  Neurological: Negative for dizziness and light-headedness.       Objective:   Physical Exam  Constitutional: She is oriented to person, place, and time. She appears well-developed and well-nourished. No distress.  HENT:  Head: Normocephalic and atraumatic.  Eyes: Conjunctivae normal and EOM are normal. No scleral icterus.  Neck: Normal range of motion. Neck supple.  Cardiovascular: Normal rate, regular rhythm and normal heart sounds.   Pulmonary/Chest: Effort normal and breath sounds normal. No respiratory distress. She has no wheezes. She has no rales.  Abdominal: Soft. Bowel sounds are normal. She exhibits no distension. There is no tenderness. There is no rebound and no guarding.  Musculoskeletal: Normal range of motion. She exhibits no edema and no tenderness.  Neurological: She is alert and oriented to person, place, and time.  Skin: Skin is warm and dry.  Psychiatric: She has a normal mood and affect.      Assessment & Plan:  71 y.o. female with worsening  - chronic diarrhea with recent exacerbation after trip to [place] - HTN, poorly controlled and allergies/reactions to various medications - Cough- likely viral URI, only one day, lungs clear. Supportive measures for now.  Napoleon Form, MD

## 2012-06-06 NOTE — Patient Instructions (Addendum)
Chronic Diarrhea Diarrhea is loose, watery stools. Having diarrhea means passing loose stools 3 or more times a day. Diarrhea that lasts longer than 4 weeks is considered long-lasting (chronic). Symptoms of chronic diarrhea may be continual or may come and go. People of all ages can get diarrhea. Body fluid loss (dehydration) may occur as a result of diarrhea. This means the body does not have as many fluids and salts (electrolytes) as it needs. CAUSES  There are many causes of chronic diarrhea. Causes may be different for children and adults. The various causes can be grouped into 2 categories: diarrhea caused by an infection and diarrhea not caused by an infection. Sometimes, the cause is unknown. Diarrhea caused by an infection may result from:  Parasites.  Bacteria.  Viral infections. Diarrhea not caused by an infection may result from:  Irritable bowel syndrome.  Reaction to medicines, such as antibiotics, cancer drugs, blood pressure medicines, and antacids.  Intestinal disease (Crohn's disease, ulcerative colitis, celiac disease).  Food allergies or sensitivity to additives (fructose, lactose, sugar substitutes).  Tumors.  Diabetes, thyroid disease, and other endocrine diseases.  Reduced blood flow to the intestine.  Previous surgery or radiation of the abdomen or gastrointestinal tract. Risk factors for chronic diarrhea include:  Having a severely weakened immune system, such as from HIV/AIDS.  Taking certain types of cancer-fighting drugs (chemotherapy) or other medicines.  A recent organ transplant.  Having a portion of the stomach removed.  Traveling to countries where food and water supplies are often contaminated. SYMPTOMS  In addition to frequent, loose stools, diarrhea may cause:  Cramping.  Abdominal pain.  Nausea.  Urgent need to use the bathroom, or loss of bowel control. If dehydration occurs, problems include:  Thirst.  Less frequent  urination.  Dark urine.  Dry skin.  Fatigue.  Dizziness. Infections that cause diarrhea may also cause a fever, chills, or bloody stools. DIAGNOSIS  Diagnosis may be difficult. Your caregiver must take a careful history and perform a physical exam. Tests given are based on your symptoms and history. Tests may include:  Blood or stool tests, in which 3 or more stool samples may be examined. Stool cultures may be used to test for bacteria or parasites.  X-rays.  A procedure in which a thin tube is inserted into the mouth or rectum (endoscopy). This allows the caregiver to look inside the intestine. TREATMENT   Diarrhea caused by an infection can often be treated with antibiotics.  Diarrhea not caused by an infection is more difficult to diagnose and treat. Long-term medicine use or surgery may be required. Specific treatment should be discussed with your caregiver.  If the cause cannot determined, treatment to relieve symptoms includes:  Preventing dehydration. Serious health problems can occur if you do not maintain proper fluid levels. Many oral rehydration solutions (ORS) are available at drug stores. Ask your caregiver what product is best for you.  Not drinking beverages that contain caffeine (tea, coffee, soft drinks).  Not drinking alcohol. It causes dehydration.  Not relying on sports drinks and broths alone to maintain proper fluid levels. They should not be used to prevent severe dehydration.  Maintaining well-balanced nutrition. This may help you recover faster. PREVENTION   Drink clean or purified water.  Use proper food handling techniques.  Maintain proper hand-washing habits. HOME CARE INSTRUCTIONS   Avoid:  Caffeine.  Greasy foods.  High fiber.  If you have problems digesting lactose during or after an episode of diarrhea, you might  want to try yogurt. Yogurt is often better tolerated, because it has less lactose than milk. Yogurt with active, live  bacterial cultures may even help you recover faster. SEEK MEDICAL CARE IF:  The person with diarrhea is an otherwise healthy adult and has:  Signs of dehydration.  Diarrhea for more than 2 days.  Severe pain in the abdomen or rectum.  An oral temperature above 102 F (38.9 C).  Stools containing blood or pus.  Stools that are black and tarry. SEEK IMMEDIATE MEDICAL CARE IF:  The person with diarrhea is a child, elderly person, or has a weakened immune system and has:  Signs of dehydration.  Diarrhea for more than 1 day.  Severe pain in the abdomen or rectum.  An oral temperature above 102 F (38.9 C), not controlled by medicine.  Stools containing blood or pus.  Stools that are black and tarry. Document Released: 09/02/2003 Document Revised: 09/04/2011 Document Reviewed: 10/29/2009 [facility] Patient Information 2013 [place], [place].  Hypertension As your heart beats, it forces blood through your arteries. This force is your blood pressure. If the pressure is too high, it is called hypertension (HTN) or high blood pressure. HTN is dangerous because you may have it and not know it. High blood pressure may mean that your heart has to work harder to pump blood. Your arteries may be narrow or stiff. The extra work puts you at risk for heart disease, stroke, and other problems.  Blood pressure consists of two numbers, a higher number over a lower, 110/72, for example. It is stated as "110 over 72." The ideal is below 120 for the top number (systolic) and under 80 for the bottom (diastolic). Write down your blood pressure today. You should pay close attention to your blood pressure if you have certain conditions such as:  Heart failure.  Prior heart attack.  Diabetes  Chronic kidney disease.  Prior stroke.  Multiple risk factors for heart disease. To see if you have HTN, your blood pressure should be measured while you are seated with your arm held at the level of the heart.  It should be measured at least twice. A one-time elevated blood pressure reading (especially in the Emergency Department) does not mean that you need treatment. There may be conditions in which the blood pressure is different between your right and left arms. It is important to see your caregiver soon for a recheck. Most people have essential hypertension which means that there is not a specific cause. This type of high blood pressure may be lowered by changing lifestyle factors such as:  Stress.  Smoking.  Lack of exercise.  Excessive weight.  Drug/tobacco/alcohol use.  Eating less salt. Most people do not have symptoms from high blood pressure until it has caused damage to the body. Effective treatment can often prevent, delay or reduce that damage. TREATMENT  When a cause has been identified, treatment for high blood pressure is directed at the cause. There are a large number of medications to treat HTN. These fall into several categories, and your caregiver will help you select the medicines that are best for you. Medications may have side effects. You should review side effects with your caregiver. If your blood pressure stays high after you have made lifestyle changes or started on medicines,   Your medication(s) may need to be changed.  Other problems may need to be addressed.  Be certain you understand your prescriptions, and know how and when to take your medicine.  Be sure to follow up with your caregiver within the time frame advised (usually within two weeks) to have your blood pressure rechecked and to review your medications.  If you are taking more than one medicine to lower your blood pressure, make sure you know how and at what times they should be taken. Taking two medicines at the same time can result in blood pressure that is too low. SEEK IMMEDIATE MEDICAL CARE IF:  You develop a severe headache, blurred or changing vision, or confusion.  You have unusual weakness  or numbness, or a faint feeling.  You have severe chest or abdominal pain, vomiting, or breathing problems. MAKE SURE YOU:   Understand these instructions.  Will watch your condition.  Will get help right away if you are not doing well or get worse. Document Released: 06/12/2005 Document Revised: 09/04/2011 Document Reviewed: 01/31/2008 [facility] Patient Information 2013 [place], [place].

## 2012-06-06 NOTE — Assessment & Plan Note (Addendum)
Stopped losartan due to throat swelling. Likely needs to be on additional BP medication. Not taking lasix regularly, only Bystolic. Has allergies/reactions to multiple drugs.  Pt to check BP at home since today's BP was before medications Follow up with PCP next week.

## 2012-06-07 NOTE — Assessment & Plan Note (Signed)
Ordered some stool studies given [place] travel to [place].  Pt advised to continue diflucan as prescribed by GI/PCP (after stool study collected) Continue lomotil, colestipol. Retry probiotics F/U with PCP/GI

## 2012-06-12 ENCOUNTER — Encounter: Payer: Self-pay | Admitting: Family Medicine

## 2012-06-12 ENCOUNTER — Ambulatory Visit (INDEPENDENT_AMBULATORY_CARE_PROVIDER_SITE_OTHER): Payer: Medicare Other | Admitting: Family Medicine

## 2012-06-12 ENCOUNTER — Telehealth: Payer: Self-pay | Admitting: Family Medicine

## 2012-06-12 ENCOUNTER — Ambulatory Visit (HOSPITAL_COMMUNITY)
Admission: RE | Admit: 2012-06-12 | Discharge: 2012-06-12 | Disposition: A | Discharge status: Home / Self Care | Payer: Medicare Other | Source: Ambulatory Visit | Attending: Family Medicine | Admitting: Family Medicine

## 2012-06-12 VITALS — BP 185/97 | HR 72 | Temp 98.2°F | Ht 61.0 in | Wt 150.0 lb

## 2012-06-12 DIAGNOSIS — R197 Diarrhea, unspecified: Secondary | ICD-10-CM

## 2012-06-12 DIAGNOSIS — I517 Cardiomegaly: Secondary | ICD-10-CM | POA: Insufficient documentation

## 2012-06-12 DIAGNOSIS — R059 Cough, unspecified: Secondary | ICD-10-CM

## 2012-06-12 DIAGNOSIS — R05 Cough: Secondary | ICD-10-CM

## 2012-06-12 DIAGNOSIS — K529 Noninfective gastroenteritis and colitis, unspecified: Secondary | ICD-10-CM

## 2012-06-12 MED ORDER — MUPIROCIN CALCIUM 2 % EX CREA: TOPICAL_CREAM | Freq: Three times a day (TID) | CUTANEOUS | Status: DC

## 2012-06-12 NOTE — Patient Instructions (Addendum)
Get chest x-ray. I will call within 2 days to discuss results.   I would advise you to take prednisone with the doxycycline.  Follow-up next week. If you are feeling well, you may cancel that appointment.

## 2012-06-12 NOTE — Assessment & Plan Note (Signed)
Acute on chronic episode has resolved. Diflucan seems to help her symptoms.

## 2012-06-12 NOTE — Progress Notes (Signed)
  Subjective:    Patient ID: [name], female    DOB: 1940/11/19, 71 y.o.   MRN: [id number]  HPI # She has had productive cough, congestion, wheezing for the past 2 months.  She denies fevers but endorses chills.  Her symptoms are persistent and unchanged. She saw her allergist at [facility] on Monday 12/16. She was given a dose of Solu-Medrol and Rx for doxycycline and prednisone to take after doxycycline course if her symptoms did not improve.  She has taken 2 doses of doxycycline which she started yesterday.  She feels better today.   She has not smoked for about 20 years but does get bronchitis "frequently" and uses inhalers. She lives with her daughter and ex-husband. Her daughter smokes significantly.   # Acute on chronic diarrhea. She has chronic diarrhea/loose stools with 2-3 bowel movements a day. She had an exacerbation of this which she saw a provider at our clinic for last week. Acute symptoms have resolved. She is back to her baseline bowel movements. Diflucan has been known to help her symptoms. She had stopped taking this but re-started with acute symptoms around 06/05/2012 and has taken 5 tablets since then.   Review of Systems Weight loss of about 3-4 pounds past few months. She has had decreased appetite without nausea/vomiting due to her respiratory symptoms. She is tolerating her diet    Objective:   Physical Exam GEN: NAD MOUTH: moist MM NOSE: no nasal congestion  PULM: NI WOB; soft wheezes lower and middle lobes bilaterally; no crackles; no ronchi ABD: soft, NT, ND EXT: 2-3 cap refill    Assessment & Plan:

## 2012-06-12 NOTE — Assessment & Plan Note (Addendum)
D/Dx: COPD exacerbation, bronchitis, (atypical) pneumonia She has wheezing and has improved with steroids and antibiotics that were given 2 days ago. We will check CXR today. Patient was advised to take prednisone with her doxycycline instead of after prn as recommended previously. Continue albuterol inhaler as needed. Follow-up in 1 week; she may cancel appointment if she is feeling better.

## 2012-06-12 NOTE — Assessment & Plan Note (Signed)
We discussed CXR results. Chronic lung changes likely from COPD. No obvious infiltrate. No changes to management from OV.   

## 2012-06-12 NOTE — Telephone Encounter (Signed)
We discussed CXR results. Chronic lung changes likely from COPD. No obvious infiltrate. No changes to management from OV.

## 2012-06-20 ENCOUNTER — Ambulatory Visit (INDEPENDENT_AMBULATORY_CARE_PROVIDER_SITE_OTHER): Payer: Medicare Other | Admitting: Family Medicine

## 2012-06-20 VITALS — BP 172/76 | HR 84 | Temp 98.5°F | Wt 149.0 lb

## 2012-06-20 DIAGNOSIS — I10 Essential (primary) hypertension: Secondary | ICD-10-CM

## 2012-06-20 DIAGNOSIS — E7800 Pure hypercholesterolemia, unspecified: Secondary | ICD-10-CM

## 2012-06-20 DIAGNOSIS — R05 Cough: Secondary | ICD-10-CM

## 2012-06-20 DIAGNOSIS — M159 Polyosteoarthritis, unspecified: Secondary | ICD-10-CM

## 2012-06-20 DIAGNOSIS — Z01419 Encounter for gynecological examination (general) (routine) without abnormal findings: Secondary | ICD-10-CM

## 2012-06-20 DIAGNOSIS — Z0000 Encounter for general adult medical examination without abnormal findings: Secondary | ICD-10-CM

## 2012-06-20 DIAGNOSIS — R059 Cough, unspecified: Secondary | ICD-10-CM

## 2012-06-20 DIAGNOSIS — K529 Noninfective gastroenteritis and colitis, unspecified: Secondary | ICD-10-CM

## 2012-06-20 DIAGNOSIS — F339 Major depressive disorder, recurrent, unspecified: Secondary | ICD-10-CM

## 2012-06-20 DIAGNOSIS — R197 Diarrhea, unspecified: Secondary | ICD-10-CM

## 2012-06-20 MED ORDER — FLUCONAZOLE 100 MG PO TABS: 100.0000 mg | ORAL_TABLET | Freq: Every day | ORAL | Status: DC

## 2012-06-20 MED ORDER — TETANUS-DIPHTH-ACELL PERTUSSIS 5-2.5-18.5 LF-MCG/0.5 IM SUSP: 0.5000 mL | Freq: Once | INTRAMUSCULAR | Status: DC

## 2012-06-20 NOTE — Assessment & Plan Note (Signed)
This is stable without medications at this time

## 2012-06-20 NOTE — Assessment & Plan Note (Signed)
She is not taking a statin. She had labs done today at cardiologist's office, including cholesterol. ROI signed for results.

## 2012-06-20 NOTE — Progress Notes (Signed)
  Subjective:    Patient ID: [name], female    DOB: 10-Jul-1940, 71 y.o.   MRN: [id number]  HPI # She would like to go over medications.   # Cough has resolved with doxycycline. She has 1 more day left. She did not take the prednisone because she was concerned it would raise her blood pressure.   # Hypertension, CAD  She saw her cardiologist today and he gave Rx for Diovan, which she has not started yet.  She is not taking Lasix. She takes prn.   # Arthritis of knees, back, feet and hands She takes Mobic daily during the wintertime.  Tramadol helps but not as much as Mobic.  ROS: denies blood in sputum, abdominal pain, blood in stool  Review of Systems Denies depression, chest pain, dyspnea  Allergies, medication, past medical history reviewed.      Objective:   Physical Exam Gen: NAD; well-appearing, -nourished PSYCH: pleasant, engaged and normally conversant, appropriate to questions, alert and oriented CV: RRR, normal S1/S2, 2/6 systolic murmur RUSB PULM: NI WOB; CTAB without w/r/r ABD: soft, NT, ND EXT: no edema; hypertrophied PIP and DIP joints SKIN: warm, dry, no rash     Assessment & Plan:

## 2012-06-20 NOTE — Assessment & Plan Note (Signed)
She takes Diflucan intermittently for symptoms, which are alleviated significantly per patient by this medication. She is aware of side effects and indications to RTC, especially signs of liver failure. Rx for Diflucan at lower dose, 100 mg given to see if lower dose helps.

## 2012-06-20 NOTE — Assessment & Plan Note (Signed)
Not well controlled. Rx for Diovan given by cardiologist today.

## 2012-06-20 NOTE — Patient Instructions (Addendum)
Follow up in 3-6 months

## 2012-06-20 NOTE — Assessment & Plan Note (Signed)
Resolved with course of doxycycline. She did not take any prednisone at home.

## 2012-06-20 NOTE — Assessment & Plan Note (Signed)
Arthritis particularly in hands and feet pain well controlled with Mobic. I encouraged her to try Tramadol (which helps some) and to take Mobic sparingly.

## 2012-06-20 NOTE — Assessment & Plan Note (Signed)
She declined flu shot. Rx for TDaP given. She will schedule mammogram.

## 2012-06-21 ENCOUNTER — Other Ambulatory Visit (HOSPITAL_COMMUNITY): Payer: Self-pay | Admitting: Cardiovascular Disease

## 2012-06-21 DIAGNOSIS — I739 Peripheral vascular disease, unspecified: Secondary | ICD-10-CM

## 2012-06-28 ENCOUNTER — Telehealth: Payer: Self-pay | Admitting: Family Medicine

## 2012-06-28 MED ORDER — TRAMADOL HCL 50 MG PO TABS: 50.0000 mg | ORAL_TABLET | Freq: Two times a day (BID) | ORAL | Status: DC | PRN

## 2012-06-28 NOTE — Telephone Encounter (Signed)
Please notify Rx sent

## 2012-06-28 NOTE — Telephone Encounter (Signed)
Is asking for refill on her Tramadol 50mg - would like 90 day supply   [facility] [address]

## 2012-07-05 ENCOUNTER — Other Ambulatory Visit: Payer: Self-pay | Admitting: *Deleted

## 2012-07-05 MED ORDER — MELOXICAM 15 MG PO TABS: 15.0000 mg | ORAL_TABLET | Freq: Every day | ORAL | Status: DC

## 2012-07-05 NOTE — Telephone Encounter (Signed)
Needs appointment for more refills.

## 2012-07-12 ENCOUNTER — Other Ambulatory Visit: Payer: Self-pay | Admitting: Family Medicine

## 2012-07-12 NOTE — Telephone Encounter (Signed)
I just refilled on 01/10. There was a mistake. She does not need another refill.

## 2012-07-14 ENCOUNTER — Other Ambulatory Visit: Payer: Self-pay | Admitting: Family Medicine

## 2012-07-22 ENCOUNTER — Ambulatory Visit (HOSPITAL_COMMUNITY)
Admission: RE | Admit: 2012-07-22 | Discharge: 2012-07-22 | Disposition: A | Discharge status: Home / Self Care | Payer: Medicare Other | Source: Ambulatory Visit | Attending: Cardiovascular Disease | Admitting: Cardiovascular Disease

## 2012-07-22 ENCOUNTER — Encounter (HOSPITAL_COMMUNITY): Payer: Medicare Other

## 2012-07-22 DIAGNOSIS — I739 Peripheral vascular disease, unspecified: Secondary | ICD-10-CM

## 2012-07-22 NOTE — Progress Notes (Signed)
[name] completed. [name]

## 2012-07-29 ENCOUNTER — Other Ambulatory Visit: Payer: Self-pay | Admitting: Family Medicine

## 2012-08-12 ENCOUNTER — Telehealth: Payer: Self-pay | Admitting: *Deleted

## 2012-08-12 ENCOUNTER — Telehealth: Payer: Self-pay | Admitting: Family Medicine

## 2012-08-12 MED ORDER — ESOMEPRAZOLE MAGNESIUM 40 MG PO CPDR: 40.0000 mg | DELAYED_RELEASE_CAPSULE | Freq: Every day | ORAL | Status: DC

## 2012-08-12 NOTE — Telephone Encounter (Signed)
She has also been on generic montelukast, atarax, hydroxyzine  She endorses persistent respiratory symptoms when not on the name brand Singulair

## 2012-08-12 NOTE — Telephone Encounter (Signed)
Form filled and given to [employer].

## 2012-08-12 NOTE — Telephone Encounter (Signed)
To MD. Fleeger, [name]  

## 2012-08-12 NOTE — Telephone Encounter (Signed)
Patient is calling back because she needs an authorization for her Singulair.  The number is [phone] Member ID [id number].

## 2012-08-12 NOTE — Telephone Encounter (Signed)
Patient is calling for a refill on Nexium for a 90 day supply with refills, sent to [facility].

## 2012-08-12 NOTE — Telephone Encounter (Signed)
PA request for Singulair. Form placed in MD box.

## 2012-08-12 NOTE — Telephone Encounter (Signed)
Rx Nexium sent. Patient notified.   She will call back and let me know what other drugs she has tried for asthma and allergic rhinitis besides albuterol and cetirizine.

## 2012-08-13 ENCOUNTER — Telehealth: Payer: Self-pay | Admitting: Family Medicine

## 2012-08-13 NOTE — Telephone Encounter (Signed)
Approval received and pharmacy notified. 

## 2012-08-13 NOTE — Telephone Encounter (Signed)
Faxed PA form to insurance.

## 2012-08-13 NOTE — Telephone Encounter (Signed)
Pharmacy notified.

## 2012-08-13 NOTE — Telephone Encounter (Signed)
Synetta Fail from [facility] is calling to let Larita Fife know that Singulair has been approved effective 08/12/12 - 08/12/13.

## 2012-08-21 ENCOUNTER — Telehealth: Payer: Self-pay | Admitting: Family Medicine

## 2012-08-21 NOTE — Telephone Encounter (Signed)
Patient would like to speak to a nurse about a referral from [facility]. Pls call patient at [phone]

## 2012-08-22 NOTE — Telephone Encounter (Signed)
Pt says that she needs a referral in order for medicaid to help pay for her oral surgery (plates?) that she needs.  Ask that she sind the name and number of there person she spoke with @ [facility] dental [facility], so I could call and get additional information. [place], [name]

## 2012-08-22 NOTE — Telephone Encounter (Signed)
Patient called back to let Shanda Bumps know that it is [facility], [phone], ask for Alyssa.

## 2012-08-23 NOTE — Telephone Encounter (Signed)
Spoke with some one at the dental office.  They said that is a pt gets a "very detailed letter" from the PCP on why this procedure is medically necessary and a letter from the dentist,the medicaid would review this and see if it is something they would cover.  Will forward to MD. Milas Gain, [name]

## 2012-08-25 NOTE — Telephone Encounter (Signed)
Please advise 

## 2012-08-25 NOTE — Telephone Encounter (Signed)
Please request patient make appointment to discuss referral for oral plates to help me make appropriate referral.  Thank you.

## 2012-08-26 NOTE — Telephone Encounter (Signed)
Has appt on 3/7 Fleeger, [facility]

## 2012-08-29 ENCOUNTER — Ambulatory Visit: Payer: Medicare Other | Admitting: Family Medicine

## 2012-08-30 ENCOUNTER — Ambulatory Visit: Payer: Medicare Other | Admitting: Family Medicine

## 2012-08-31 ENCOUNTER — Other Ambulatory Visit: Payer: Self-pay | Admitting: Family Medicine

## 2012-09-02 ENCOUNTER — Ambulatory Visit (INDEPENDENT_AMBULATORY_CARE_PROVIDER_SITE_OTHER): Payer: Medicare Other | Admitting: Family Medicine

## 2012-09-02 ENCOUNTER — Encounter: Payer: Self-pay | Admitting: Family Medicine

## 2012-09-02 VITALS — BP 160/76 | HR 72 | Temp 97.6°F | Ht 61.0 in | Wt 149.0 lb

## 2012-09-02 DIAGNOSIS — K0889 Other specified disorders of teeth and supporting structures: Secondary | ICD-10-CM

## 2012-09-02 DIAGNOSIS — R633 Feeding difficulties, unspecified: Secondary | ICD-10-CM

## 2012-09-02 DIAGNOSIS — I10 Essential (primary) hypertension: Secondary | ICD-10-CM

## 2012-09-02 NOTE — Progress Notes (Signed)
  Subjective:    Patient ID: [name], female    DOB: 24-Sep-1940, 72 y.o.   MRN: [id number]  HPI # Needs letter for new dentures  She saw her dentists at [facility].  She was recommended to get new dentures that fit her better and will help function better (they will have screws to fix to her gum). She feels like she has difficulty chewing with her current denture which are removable.  The dentist Dr. Alois Cliche saw her most recently on 03/05. And per patient, he recommends that she get these new dentures.  She is trying to get Medicaid to approve her dentures. She needs a letter from me trying to get this approved.   Review of Systems Per HPI Denies chest pain, dyspnea, tongue swelling  Allergies, medication, past medical history reviewed.  Smoking status noted.  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   She had pneumonia diagnosed at [place] 3 weeks ago. She was on Levaquin. Then took amoxicillin and started breaking-out.   HTN: she is waiting for rash to resolve before starting losartan (prescribed by cardiologist)     Objective:   Physical Exam GEN: NAD SKIN: dry flaky rash dorsum of hands PULM: NI WOB; CTAB without w/r/r     Assessment & Plan:

## 2012-09-02 NOTE — Assessment & Plan Note (Signed)
Due to poorly fitting dentures. Letter written to give to Medicaid office to see if dentures can be covered; she sees [facility] clinic who has already made dentures for her and recommend new ones.

## 2012-09-02 NOTE — Assessment & Plan Note (Signed)
Not well controlled. She was started on losartan by Dr. Alanda Amass but has not taken due to rash that broke out prior.

## 2012-09-02 NOTE — Patient Instructions (Signed)
Letter for dentures  Follow-up to discuss your rash

## 2012-09-03 ENCOUNTER — Ambulatory Visit: Payer: Medicare Other | Admitting: Family Medicine

## 2012-09-06 ENCOUNTER — Other Ambulatory Visit: Payer: Self-pay | Admitting: Family Medicine

## 2012-09-29 ENCOUNTER — Other Ambulatory Visit: Payer: Self-pay | Admitting: Family Medicine

## 2012-10-07 ENCOUNTER — Other Ambulatory Visit (HOSPITAL_COMMUNITY): Payer: Self-pay | Admitting: Cardiovascular Disease

## 2012-10-07 DIAGNOSIS — I70219 Atherosclerosis of native arteries of extremities with intermittent claudication, unspecified extremity: Secondary | ICD-10-CM

## 2012-10-07 DIAGNOSIS — R609 Edema, unspecified: Secondary | ICD-10-CM

## 2012-10-07 DIAGNOSIS — I739 Peripheral vascular disease, unspecified: Secondary | ICD-10-CM

## 2012-10-10 ENCOUNTER — Other Ambulatory Visit: Payer: Self-pay | Admitting: Family Medicine

## 2012-10-10 NOTE — Addendum Note (Signed)
Addended by: Priscella Mann J on: 10/10/2012 12:36 PM   Modules accepted: Orders

## 2012-10-14 ENCOUNTER — Other Ambulatory Visit: Payer: Self-pay | Admitting: Family Medicine

## 2012-10-14 ENCOUNTER — Telehealth: Payer: Self-pay | Admitting: Family Medicine

## 2012-10-14 MED ORDER — DIPHENOXYLATE-ATROPINE 2.5-0.025 MG PO TABS: 1.0000 | ORAL_TABLET | Freq: Four times a day (QID) | ORAL | Status: DC | PRN

## 2012-10-14 NOTE — Addendum Note (Signed)
Addended by: Priscella Mann J on: 10/14/2012 09:36 AM   Modules accepted: Orders

## 2012-10-14 NOTE — Telephone Encounter (Signed)
[place] had diarrhea for 3 days and her joints are all swollen - is asking for refill on her Lomotil and Tramadol  [facility]

## 2012-10-14 NOTE — Telephone Encounter (Signed)
Please notify Rx sent

## 2012-10-21 ENCOUNTER — Encounter: Payer: Self-pay | Admitting: Home Health Services

## 2012-10-21 DIAGNOSIS — Z9181 History of falling: Secondary | ICD-10-CM | POA: Insufficient documentation

## 2012-11-04 ENCOUNTER — Ambulatory Visit (HOSPITAL_COMMUNITY)
Admission: RE | Admit: 2012-11-04 | Discharge: 2012-11-04 | Disposition: A | Discharge status: Home / Self Care | Payer: Medicare Other | Source: Ambulatory Visit | Attending: Cardiovascular Disease | Admitting: Cardiovascular Disease

## 2012-11-04 ENCOUNTER — Other Ambulatory Visit: Payer: Self-pay | Admitting: Cardiovascular Disease

## 2012-11-04 DIAGNOSIS — R609 Edema, unspecified: Secondary | ICD-10-CM

## 2012-11-04 DIAGNOSIS — I739 Peripheral vascular disease, unspecified: Secondary | ICD-10-CM

## 2012-11-04 DIAGNOSIS — I70219 Atherosclerosis of native arteries of extremities with intermittent claudication, unspecified extremity: Secondary | ICD-10-CM | POA: Insufficient documentation

## 2012-11-04 LAB — IRON AND TIBC
%SAT: 18 % — ABNORMAL LOW (ref 20–55)
Iron: 73 ug/dL (ref 42–145)

## 2012-11-04 LAB — RETICULOCYTES
ABS Retic: 73.6 10*3/uL (ref 19.0–186.0)
Retic Ct Pct: 2 % (ref 0.4–2.3)

## 2012-11-04 LAB — VITAMIN B12: Vitamin B-12: 274 pg/mL (ref 211–911)

## 2012-11-04 LAB — FOLATE: Folate: 13.6 ng/mL

## 2012-11-04 NOTE — Progress Notes (Signed)
Lower Extremity Arterial Duplex Complete. [name]

## 2012-11-04 NOTE — Progress Notes (Signed)
Arterial Duplex Lower Ext. Completed. [name] D  

## 2012-11-04 NOTE — Progress Notes (Signed)
Venous Duplex Completed for Venous insufficiency. [name]

## 2012-11-05 ENCOUNTER — Telehealth: Payer: Self-pay | Admitting: Family Medicine

## 2012-11-05 NOTE — Telephone Encounter (Signed)
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

## 2012-11-06 ENCOUNTER — Other Ambulatory Visit: Payer: Self-pay | Admitting: Family Medicine

## 2012-11-07 ENCOUNTER — Telehealth: Payer: Self-pay | Admitting: Cardiovascular Disease

## 2012-11-07 NOTE — Telephone Encounter (Signed)
Returned call.  Pt. Returned call r/t results.  Lab results given and pt informed Dr. Alanda Amass wants her to take Nu Iron and Folic Acid.  Pt stated she already called [facility] to get a refill on the iron and she will need a rx for the folic acid sent to [facility] on [address] [facility].  Pt would also like J. C., LPN to call her about her doppler results.  Pt. Informed JC will be notified and her rx will be sent to [facility].  Pt verbalized understanding and agreed w/ plan.  Folic acid 1mg  one PO daily sent via [employer].

## 2012-11-07 NOTE — Telephone Encounter (Signed)
[name] is returning your call . Please call her on her cell .Marland [phone].Marland Kitchen

## 2012-11-10 ENCOUNTER — Other Ambulatory Visit: Payer: Self-pay | Admitting: Family Medicine

## 2012-11-11 ENCOUNTER — Telehealth (HOSPITAL_COMMUNITY): Payer: Self-pay | Admitting: Cardiovascular Disease

## 2012-11-11 NOTE — Telephone Encounter (Signed)
Pt states that she was placed on Folic acid and NuIron----rx was not called in----[facility]

## 2012-11-11 NOTE — Telephone Encounter (Signed)
Rx for Nuiron 150mg  QD, Folic Acid 150mg  QD called to [facility] #30 w/2 refills Nu Iron and #30 10 refills on Folic Acid

## 2012-11-27 ENCOUNTER — Other Ambulatory Visit: Payer: Self-pay | Admitting: Family Medicine

## 2012-12-03 ENCOUNTER — Other Ambulatory Visit: Payer: Self-pay | Admitting: Family Medicine

## 2012-12-03 NOTE — Telephone Encounter (Signed)
Wyatt Haste, RN-BSN Requested Prescriptions   Pending Prescriptions Disp Refills  . MOBIC 15 MG tablet [Pharmacy Med Name: MOBIC 15MG  TABLETS] 30 tablet 0    Sig: TAKE ONE TABLET BY MOUTH ONCE DAILY

## 2012-12-09 ENCOUNTER — Telehealth: Payer: Self-pay | Admitting: Cardiovascular Disease

## 2012-12-09 NOTE — Telephone Encounter (Signed)
Can not take the Potassim pill-can she take the liquid? If so [facility]-[phone]

## 2012-12-09 NOTE — Telephone Encounter (Signed)
Returned call.  Left message to call back before 4pm.  Pt will need to check w/ her insurance to make sure it is covered w/ her plan.

## 2012-12-10 ENCOUNTER — Encounter: Payer: Self-pay | Admitting: Family Medicine

## 2012-12-10 ENCOUNTER — Ambulatory Visit (INDEPENDENT_AMBULATORY_CARE_PROVIDER_SITE_OTHER): Payer: Medicare Other | Admitting: Family Medicine

## 2012-12-10 VITALS — BP 166/73 | HR 76 | Temp 98.3°F | Ht 61.0 in | Wt 147.0 lb

## 2012-12-10 DIAGNOSIS — R21 Rash and other nonspecific skin eruption: Secondary | ICD-10-CM

## 2012-12-10 DIAGNOSIS — M159 Polyosteoarthritis, unspecified: Secondary | ICD-10-CM

## 2012-12-10 DIAGNOSIS — I2510 Atherosclerotic heart disease of native coronary artery without angina pectoris: Secondary | ICD-10-CM

## 2012-12-10 DIAGNOSIS — I10 Essential (primary) hypertension: Secondary | ICD-10-CM

## 2012-12-10 LAB — BASIC METABOLIC PANEL
BUN: 17 mg/dL (ref 6–23)
Calcium: 9.4 mg/dL (ref 8.4–10.5)
Creat: 0.88 mg/dL (ref 0.50–1.10)

## 2012-12-10 LAB — MAGNESIUM: Magnesium: 1.5 mg/dL (ref 1.5–2.5)

## 2012-12-10 MED ORDER — MELOXICAM 15 MG PO TABS: 15.0000 mg | ORAL_TABLET | Freq: Every day | ORAL | Status: DC

## 2012-12-10 MED ORDER — TRAMADOL HCL 50 MG PO TABS: 50.0000 mg | ORAL_TABLET | Freq: Two times a day (BID) | ORAL | Status: DC | PRN

## 2012-12-10 MED ORDER — POTASSIUM CHLORIDE 20 MEQ/15ML (10%) PO LIQD: 10.0000 meq | Freq: Every day | ORAL | Status: DC

## 2012-12-10 NOTE — Patient Instructions (Addendum)
Liquid potassium  You may continue meloxicam daily and Tramadol for breakthrough However, please come in if you develop chest pain, leg swelling, difficulty breathing  Follow-up in 3 months or sooner if needed  Please restart cholesterol medication   If your lab results are normal, I will send you a letter with the results. If abnormal, someone at the clinic will get in touch with you.

## 2012-12-10 NOTE — Telephone Encounter (Signed)
Returned call. Left message to call back.

## 2012-12-10 NOTE — Telephone Encounter (Signed)
Returned call.  Left message to call back tomorrow before 4pm if assistance still needed.

## 2012-12-10 NOTE — Progress Notes (Signed)
  Subjective:    Patient ID: [name], female    DOB: [birth year], 72 y.o.   MRN: [id number]  HPI # HLD She stopped taking statin because she was taking too many medication  # Rash on her left breast.  It came up yesterday. It is itchy. She denies pain. She tried using her fungal/steroid cream and it helps with the itching.   # Osteoarthritis of hands and knees It is well controlled with daily meloxicam and prn Tramadol.  ROS: denies reflux symptoms, abdominal pain  Review of Systems Per HPI Denies chest pain, dyspnea  Allergies, medication, past medical history reviewed.  Smoking status noted.     Objective:   Physical Exam GEN: NAD; well-appearing CV: RRR, no m/r/g PULM: NI WOB; CTAB without w/r/r EXT: non-pitting pretibial edema SKIN: papular rash medial intetriginous area right breast; warmer than contralateral side; no drainage; non-tender JOINTS: hypertrophied hand and knee joints; no swelling, erythema, warmth    Assessment & Plan:

## 2012-12-11 ENCOUNTER — Telehealth: Payer: Self-pay | Admitting: Family Medicine

## 2012-12-11 ENCOUNTER — Other Ambulatory Visit: Payer: Self-pay | Admitting: Family Medicine

## 2012-12-11 NOTE — Assessment & Plan Note (Signed)
Vesiculopapular rash medial/under left breast.  Continue fungal/steroid cream which seems to be helping.  Follow-up if not improving/worsened/pain.

## 2012-12-11 NOTE — Assessment & Plan Note (Signed)
Strongly encouraged to restart statin (she felt she was taking too many medications).

## 2012-12-11 NOTE — Assessment & Plan Note (Signed)
She is doing well with Mobic daily and Tramadol prn.  -Cr normal today, no symptoms gastritis>>>Continue for now with close monitoring for CHF exacerbation.  -Follow-up in 3 months

## 2012-12-11 NOTE — Telephone Encounter (Signed)
LVM.  Notified of labs WNL. Mg borderline low, advised continue magnesium supplements (she had stopped for a while thinking maybe she didn't need).

## 2012-12-13 ENCOUNTER — Telehealth: Payer: Self-pay | Admitting: Family Medicine

## 2012-12-13 NOTE — Telephone Encounter (Signed)
Pt says she would like ear drops for pain. She has an earache due to allergies. [facility]   [phone] Please advise

## 2012-12-16 ENCOUNTER — Telehealth: Payer: Self-pay | Admitting: Cardiovascular Disease

## 2012-12-16 NOTE — Telephone Encounter (Signed)
Returned call.  Pt stated Dr. Alanda Amass ordered the potassium pills for her and she can't take them b/c she had intestine surgery and they go right through her.  Pt stated she called Dr. Alanda Amass last week and couldn't get him so her internal medicine doctor wrote the prescription for it and she can't afford it.  Pt wants to know if Dr. Alanda Amass will call her insurance company so they can pay for it.  Pt RN attempted to contact her several times last week w/o success to inform her that she needed to contact her insurance to make sure they would cover it before it was rx'd.  Pt also informed Dr. Alanda Amass is out of the office for the next two weeks as well as his nurse.  Pt informed clinical staff will be notified r/t her request and RN could not guarantee that PA will be able to be done since Dr. Alanda Amass actually rx'd pills and another provider rx'd liquid.  Pt verbalized understanding and agreed w/ plan.  Message forwarded to K. Petra Kuba, Charity fundraiser. Chart# [id number] on triage cart.

## 2012-12-16 NOTE — Telephone Encounter (Signed)
Patient is calling to check on the status of the ear drops for pain.  Also, her insurance isn't paying for the potassium liquid so she wants Dr. Madolyn Frieze to contact the insurance to let them know that due to her short intestines she needs the liquid.

## 2012-12-16 NOTE — Telephone Encounter (Signed)
LMOVM that patient would need to be seen for ear pain.  I also told patient that i believe ear numbing drops are available OTC, not Rx.  Also, regarding the potassium liquid, patient needs to contact pharmacy for alternatives then get back with [place].  Beyza Bellino, Darlyne Russian, CMA

## 2012-12-16 NOTE — Telephone Encounter (Signed)
Can not tolerate her Potassium medicine-needs something else!

## 2012-12-17 ENCOUNTER — Other Ambulatory Visit: Payer: Self-pay | Admitting: Family Medicine

## 2012-12-17 MED ORDER — FLUCONAZOLE 100 MG PO TABS: 100.0000 mg | ORAL_TABLET | Freq: Every day | ORAL | Status: DC | PRN

## 2012-12-17 NOTE — Telephone Encounter (Signed)
Prior authorization form faxed - to [employer]. Wyatt Haste, RN-BSN

## 2012-12-17 NOTE — Telephone Encounter (Signed)
Fax placed in your box for liquid Potassium - Return to me - Wyatt Haste, RN-BSN

## 2012-12-17 NOTE — Telephone Encounter (Signed)
Called pt for clarification of needs regarding letter that she dropped off yesterday - pt states that she needs special authorization for liquid pot because pills bother her stomach. Wyatt Haste, RN-BSN

## 2012-12-17 NOTE — Telephone Encounter (Signed)
[employer] [place] called regarding potassium liquid form - it does require prior authorization - will send prior authorization fax. Wyatt Haste, RN-BSN

## 2012-12-18 ENCOUNTER — Other Ambulatory Visit: Payer: Self-pay | Admitting: *Deleted

## 2012-12-18 MED ORDER — ZOLPIDEM TARTRATE ER 12.5 MG PO TBCR: 12.5000 mg | EXTENDED_RELEASE_TABLET | Freq: Every evening | ORAL | Status: AC | PRN

## 2012-12-18 NOTE — Telephone Encounter (Signed)
Please notify Rx ready for pick up front desk

## 2012-12-18 NOTE — Telephone Encounter (Signed)
Pt called and informed that prescription is ready for pick up at her convenience Wyatt Haste, RN-BSN

## 2012-12-23 NOTE — Telephone Encounter (Signed)
lmom 

## 2012-12-24 ENCOUNTER — Telehealth: Payer: Self-pay | Admitting: *Deleted

## 2012-12-24 NOTE — Telephone Encounter (Signed)
[facility] called and informed that pt picked up script from clinic and will have to bring in to them to get refilled. Wyatt Haste, RN-BSN

## 2013-01-06 ENCOUNTER — Other Ambulatory Visit: Payer: Self-pay | Admitting: Family Medicine

## 2013-01-06 NOTE — Telephone Encounter (Signed)
Refilled pyridium for patient.

## 2013-01-06 NOTE — Telephone Encounter (Signed)
Refilled singulair for patient.

## 2013-01-10 NOTE — Telephone Encounter (Signed)
i spoke with patient and she has new insurance.  She will get pharmacy to run the liquid potassium through the new insurance.  If there is still a problem she will cal [place] back.  She only takes potassium prn

## 2013-01-16 ENCOUNTER — Telehealth: Payer: Self-pay | Admitting: Cardiovascular Disease

## 2013-01-16 NOTE — Telephone Encounter (Signed)
Talked to pt. About her leg cramps and pt. To call kay on fri. About some lower legs stockings. And let me know if her insurance will pay for them

## 2013-01-16 NOTE — Telephone Encounter (Signed)
[name] states that she is having severe leg cramps.  Please call.

## 2013-01-21 ENCOUNTER — Ambulatory Visit: Payer: Medicare Other | Admitting: Family Medicine

## 2013-01-28 ENCOUNTER — Telehealth: Payer: Self-pay | Admitting: Family Medicine

## 2013-01-28 NOTE — Telephone Encounter (Signed)
Patient is calling for a refill on her generic for Diflucan and Nexium to go to [facility] at [facility] [phone].  [place] is rescheduling her appt for tomorrow because she is sick and it is raining.

## 2013-01-28 NOTE — Telephone Encounter (Signed)
Will fwd to MD.  [name] L, CMA  

## 2013-01-29 MED ORDER — ESOMEPRAZOLE MAGNESIUM 40 MG PO CPDR: 40.0000 mg | DELAYED_RELEASE_CAPSULE | Freq: Every day | ORAL | Status: DC

## 2013-01-29 MED ORDER — FLUCONAZOLE 100 MG PO TABS: 100.0000 mg | ORAL_TABLET | Freq: Every day | ORAL | Status: DC | PRN

## 2013-01-29 NOTE — Telephone Encounter (Signed)
Refilled medications. Sent to pharmacy in [place].

## 2013-01-30 ENCOUNTER — Ambulatory Visit: Payer: Medicare Other | Admitting: Family Medicine

## 2013-02-03 ENCOUNTER — Telehealth: Payer: Self-pay | Admitting: Cardiovascular Disease

## 2013-02-03 NOTE — Telephone Encounter (Signed)
Please call-Has a urinary tract infection.Wants a lab order-says she does not have a primary doctor at the time.

## 2013-02-03 NOTE — Telephone Encounter (Signed)
Lm to contact an urgent care facility.

## 2013-02-10 ENCOUNTER — Other Ambulatory Visit: Payer: Self-pay | Admitting: *Deleted

## 2013-02-10 ENCOUNTER — Telehealth: Payer: Self-pay | Admitting: Family Medicine

## 2013-02-10 DIAGNOSIS — I701 Atherosclerosis of renal artery: Secondary | ICD-10-CM

## 2013-02-10 DIAGNOSIS — I771 Stricture of artery: Secondary | ICD-10-CM

## 2013-02-10 DIAGNOSIS — R0989 Other specified symptoms and signs involving the circulatory and respiratory systems: Secondary | ICD-10-CM

## 2013-02-10 NOTE — Telephone Encounter (Signed)
Patient is calling requesting a refill on her diphenoxylate-atropine (LOMOTIL) and Mobic.

## 2013-02-10 NOTE — Telephone Encounter (Signed)
Pt has appt with MD tomorrow (02/11/13). [name], [name]

## 2013-02-11 ENCOUNTER — Ambulatory Visit (INDEPENDENT_AMBULATORY_CARE_PROVIDER_SITE_OTHER): Payer: Medicare Other | Admitting: Family Medicine

## 2013-02-11 ENCOUNTER — Encounter: Payer: Self-pay | Admitting: Family Medicine

## 2013-02-11 VITALS — BP 151/82 | HR 93 | Temp 98.0°F | Wt 147.0 lb

## 2013-02-11 DIAGNOSIS — E538 Deficiency of other specified B group vitamins: Secondary | ICD-10-CM

## 2013-02-11 DIAGNOSIS — R197 Diarrhea, unspecified: Secondary | ICD-10-CM

## 2013-02-11 DIAGNOSIS — N390 Urinary tract infection, site not specified: Secondary | ICD-10-CM

## 2013-02-11 DIAGNOSIS — R109 Unspecified abdominal pain: Secondary | ICD-10-CM

## 2013-02-11 DIAGNOSIS — K529 Noninfective gastroenteritis and colitis, unspecified: Secondary | ICD-10-CM

## 2013-02-11 LAB — POCT URINALYSIS DIPSTICK
Bilirubin, UA: NEGATIVE
Blood, UA: NEGATIVE
Ketones, UA: NEGATIVE
Spec Grav, UA: 1.02
pH, UA: 5.5

## 2013-02-11 LAB — POCT UA - MICROSCOPIC ONLY

## 2013-02-11 MED ORDER — CIPROFLOXACIN HCL 250 MG PO TABS: 250.0000 mg | ORAL_TABLET | Freq: Two times a day (BID) | ORAL | Status: DC

## 2013-02-11 MED ORDER — CYANOCOBALAMIN 1000 MCG/ML IJ SOLN
1000.0000 ug | Freq: Once | INTRAMUSCULAR | Status: AC
  Administered 2013-02-11: 1000 ug via INTRAMUSCULAR

## 2013-02-11 MED ORDER — MELOXICAM 15 MG PO TABS: ORAL_TABLET | ORAL | Status: DC

## 2013-02-11 MED ORDER — DIPHENOXYLATE-ATROPINE 2.5-0.025 MG PO TABS: 1.0000 | ORAL_TABLET | Freq: Four times a day (QID) | ORAL | Status: DC | PRN

## 2013-02-11 NOTE — Patient Instructions (Signed)
Nice to meet you. I have sent in a prescription for your UTI. You will take 1 pill 2 times a day for 3 days. I have also refilled your lomotil and mobic.

## 2013-02-11 NOTE — Progress Notes (Signed)
  Subjective:    Patient ID: [name], female    DOB: April 05, 1941, 71 y.o.   MRN: [id number]  Abdominal Pain   Patient is a 72 yo female who presents for f/u of UTI and IBS.  UTI: recently [place] the beach. Got a partial course of keflex and then got doxy for PNA. Continues to have suprapubic discomfort and dysuria and urgency. Per culture results from the beach this grew e coli.   IBS: patient with diarrhea predominant symptoms. Worse recently. States has been difficult to control. States lomotil and colestid help. Patient is followed for this by Dr. Ewing Schlein.   Review of Systems  Gastrointestinal: Positive for abdominal pain.   see HPI     Objective:   Physical Exam  Constitutional: She appears well-developed and well-nourished.  HENT:  Head: Normocephalic and atraumatic.  Cardiovascular: Normal rate, regular rhythm and normal heart sounds.   Pulmonary/Chest: Effort normal and breath sounds normal.  Abdominal: Soft. Bowel sounds are normal. She exhibits no distension. There is tenderness (suprapubic). There is no rebound and no guarding.  Musculoskeletal: She exhibits no edema.  Skin: Skin is warm and dry.  BP 151/82  Pulse 93  Temp(Src) 98 F (36.7 C) (Oral)  Wt 147 lb (66.679 kg)  BMI 27.79 kg/m2    Assessment & Plan:

## 2013-02-11 NOTE — Assessment & Plan Note (Signed)
Patient with incomplete course of antibiotics for previous UTI. Still with symptoms and WBCs in urine. Will treat with 3 day course of cipro. Consider that abdominal discomfort may be related to chronic diarrhea.

## 2013-02-11 NOTE — Assessment & Plan Note (Signed)
Worsened recently. Has been taking 4 packets of colestid at the recommendation of her cardiologist. Will refill lomotil. If not improved at follow-up will likely need to return to Dr. Ewing Schlein.

## 2013-02-12 ENCOUNTER — Other Ambulatory Visit: Payer: Self-pay | Admitting: Family Medicine

## 2013-02-12 ENCOUNTER — Telehealth: Payer: Self-pay | Admitting: Family Medicine

## 2013-02-12 NOTE — Telephone Encounter (Signed)
I am waiting on fax from them for prior authorization - I have already called them this morning. Wyatt Haste, RN-BSN

## 2013-02-12 NOTE — Telephone Encounter (Signed)
Have a seen a prior authorization for this medication?  Tiasia Weberg,CMA

## 2013-02-12 NOTE — Telephone Encounter (Signed)
Pt says Dr Serena Croissant needs to call [facility] and authorize insurance to cover Mobic instead of generic. She says she cannot take the generic. Phone number for [employer]  [phone] Please advise

## 2013-02-12 NOTE — Telephone Encounter (Signed)
Received prior authorization form from [facility] for MObic.  [employer] called to start authorization process - will send fax - to be placed in your box upon receiving.  Wyatt Haste, RN-BSN

## 2013-02-13 NOTE — Telephone Encounter (Signed)
Call placed to [employer] - have not received fax r/t mobic as requested- to be sent again. [phone] - authorization department states that it is on "deleted" list meaning it will probably not be covered even though it goes the process  - called pt and informed that form would be sent as soon as it arrives but likely would not be approved. Wyatt Haste, RN-BSN

## 2013-02-13 NOTE — Telephone Encounter (Signed)
Pt left fax number for prior authorization line [phone]

## 2013-02-14 ENCOUNTER — Telehealth: Payer: Self-pay | Admitting: *Deleted

## 2013-02-14 NOTE — Telephone Encounter (Signed)
mobic approved for one year. Wyatt Haste, RN-BSN

## 2013-02-17 ENCOUNTER — Telehealth: Payer: Self-pay | Admitting: Family Medicine

## 2013-02-17 NOTE — Telephone Encounter (Signed)
Pt was seen last week by Dr. Birdie Sons and forgot to ask him for diphenoxylate, she has diarrhea and needs this. She is in [facility] and she would like this sent to [facility]. The number is [phone]. JW

## 2013-02-18 ENCOUNTER — Encounter: Payer: Self-pay | Admitting: Cardiovascular Disease

## 2013-02-18 NOTE — Telephone Encounter (Signed)
[facility], RN-BSN

## 2013-02-18 NOTE — Telephone Encounter (Signed)
Patient is calling because the Lomotil was not sent correctly and now she needs it sent to [facility] to [facility] [phone] is the phone number.

## 2013-02-18 NOTE — Telephone Encounter (Signed)
Please advise- Elizabeth Kadia Abaya, RN-BSN  

## 2013-03-13 ENCOUNTER — Encounter (HOSPITAL_COMMUNITY): Payer: Medicare Other

## 2013-03-13 ENCOUNTER — Ambulatory Visit (HOSPITAL_COMMUNITY)
Admission: RE | Admit: 2013-03-13 | Discharge: 2013-03-13 | Disposition: A | Discharge status: Home / Self Care | Payer: Medicare Other | Source: Ambulatory Visit | Attending: Internal Medicine | Admitting: Internal Medicine

## 2013-03-13 ENCOUNTER — Ambulatory Visit (HOSPITAL_COMMUNITY): Payer: Medicare Other

## 2013-03-13 DIAGNOSIS — R0989 Other specified symptoms and signs involving the circulatory and respiratory systems: Secondary | ICD-10-CM | POA: Insufficient documentation

## 2013-03-13 DIAGNOSIS — I701 Atherosclerosis of renal artery: Secondary | ICD-10-CM | POA: Insufficient documentation

## 2013-03-13 HISTORY — PX: OTHER SURGICAL HISTORY: SHX169

## 2013-03-13 NOTE — Progress Notes (Signed)
Carotid Duplex Completed. Miaya Lafontant, BS, RDMS, RVT  

## 2013-03-13 NOTE — Progress Notes (Signed)
Renal Artery Duplex Completed. °Brianna L Mazza,RVT °

## 2013-03-19 ENCOUNTER — Telehealth: Payer: Self-pay | Admitting: Family Medicine

## 2013-03-19 DIAGNOSIS — I10 Essential (primary) hypertension: Secondary | ICD-10-CM

## 2013-03-19 MED ORDER — NEBIVOLOL HCL 5 MG PO TABS: 5.0000 mg | ORAL_TABLET | Freq: Every day | ORAL | Status: DC

## 2013-03-19 NOTE — Telephone Encounter (Signed)
Pt agreeable, appt made. [name] Dawn  

## 2013-03-19 NOTE — Telephone Encounter (Signed)
Refilled bystolic for one month. Patient has not been seen for her BP in about 6 months. Needs follow-up for this prior to additional refills.

## 2013-03-24 ENCOUNTER — Telehealth: Payer: Self-pay | Admitting: Cardiovascular Disease

## 2013-03-24 NOTE — Telephone Encounter (Signed)
Pt called and given results again . Pt. Stated understanding of results . Renal dopplers and carotid dopplers faxed to dr. Joellyn Quails

## 2013-03-24 NOTE — Telephone Encounter (Signed)
Please call-concerning her Renal ultrasoind results.

## 2013-03-24 NOTE — Telephone Encounter (Signed)
Message forwarded to [facility]. Berlinda Last, LPN.  Chart# [id number] on desk.

## 2013-04-07 ENCOUNTER — Other Ambulatory Visit: Payer: Self-pay | Admitting: Family Medicine

## 2013-04-17 ENCOUNTER — Ambulatory Visit (INDEPENDENT_AMBULATORY_CARE_PROVIDER_SITE_OTHER): Payer: Medicare Other | Admitting: Family Medicine

## 2013-04-17 ENCOUNTER — Encounter: Payer: Self-pay | Admitting: Family Medicine

## 2013-04-17 VITALS — BP 150/82 | HR 77 | Temp 98.0°F | Wt 145.0 lb

## 2013-04-17 DIAGNOSIS — K529 Noninfective gastroenteritis and colitis, unspecified: Secondary | ICD-10-CM

## 2013-04-17 DIAGNOSIS — R197 Diarrhea, unspecified: Secondary | ICD-10-CM

## 2013-04-17 DIAGNOSIS — I10 Essential (primary) hypertension: Secondary | ICD-10-CM

## 2013-04-17 DIAGNOSIS — R300 Dysuria: Secondary | ICD-10-CM | POA: Insufficient documentation

## 2013-04-17 LAB — POCT URINALYSIS DIPSTICK
Blood, UA: NEGATIVE
Spec Grav, UA: <=1.005

## 2013-04-17 LAB — POCT UA - MICROSCOPIC ONLY

## 2013-04-17 MED ORDER — NEBIVOLOL HCL 5 MG PO TABS: 5.0000 mg | ORAL_TABLET | Freq: Every day | ORAL | Status: DC

## 2013-04-17 MED ORDER — COLESTIPOL HCL 5 G PO PACK: 5.0000 g | PACK | Freq: Two times a day (BID) | ORAL | Status: DC

## 2013-04-17 MED ORDER — MOBIC 15 MG PO TABS: ORAL_TABLET | ORAL | Status: DC

## 2013-04-17 MED ORDER — FLUCONAZOLE 100 MG PO TABS: 100.0000 mg | ORAL_TABLET | Freq: Every day | ORAL | Status: DC | PRN

## 2013-04-17 MED ORDER — DIPHENOXYLATE-ATROPINE 2.5-0.025 MG PO TABS: 1.0000 | ORAL_TABLET | Freq: Four times a day (QID) | ORAL | Status: DC | PRN

## 2013-04-17 NOTE — Patient Instructions (Addendum)
Nice to see you again. We will send your urine for a culture. I have given you refills. Please follow-up with your cardiologist regarding your chest pain.  Chest Pain (Nonspecific) It is often hard to give a specific diagnosis for the cause of chest pain. There is always a chance that your pain could be related to something serious, such as a heart attack or a blood clot in the lungs. You need to follow up with your caregiver for further evaluation. CAUSES   Heartburn.  Pneumonia or bronchitis.  Anxiety or stress.  Inflammation around your heart (pericarditis) or lung (pleuritis or pleurisy).  A blood clot in the lung.  A collapsed lung (pneumothorax). It can develop suddenly on its own (spontaneous pneumothorax) or from injury (trauma) to the chest.  Shingles infection (herpes zoster virus). The chest wall is composed of bones, muscles, and cartilage. Any of these can be the source of the pain.  The bones can be bruised by injury.  The muscles or cartilage can be strained by coughing or overwork.  The cartilage can be affected by inflammation and become sore (costochondritis). DIAGNOSIS  Lab tests or other studies, such as X-rays, electrocardiography, stress testing, or cardiac imaging, may be needed to find the cause of your pain.  TREATMENT   Treatment depends on what may be causing your chest pain. Treatment may include:  Acid blockers for heartburn.  Anti-inflammatory medicine.  Pain medicine for inflammatory conditions.  Antibiotics if an infection is present.  You may be advised to change lifestyle habits. This includes stopping smoking and avoiding alcohol, caffeine, and chocolate.  You may be advised to keep your head raised (elevated) when sleeping. This reduces the chance of acid going backward from your stomach into your esophagus.  Most of the time, nonspecific chest pain will improve within 2 to 3 days with rest and mild pain medicine. HOME CARE  INSTRUCTIONS   If antibiotics were prescribed, take your antibiotics as directed. Finish them even if you start to feel better.  For the next few days, avoid physical activities that bring on chest pain. Continue physical activities as directed.  Do not smoke.  Avoid drinking alcohol.  Only take over-the-counter or prescription medicine for pain, discomfort, or fever as directed by your caregiver.  Follow your caregiver's suggestions for further testing if your chest pain does not go away.  Keep any follow-up appointments you made. If you do not go to an appointment, you could develop lasting (chronic) problems with pain. If there is any problem keeping an appointment, you must call to reschedule. SEEK MEDICAL CARE IF:   You think you are having problems from the medicine you are taking. Read your medicine instructions carefully.  Your chest pain does not go away, even after treatment.  You develop a rash with blisters on your chest. SEEK IMMEDIATE MEDICAL CARE IF:   You have increased chest pain or pain that spreads to your arm, neck, jaw, back, or abdomen.  You develop shortness of breath, an increasing cough, or you are coughing up blood.  You have severe back or abdominal pain, feel nauseous, or vomit.  You develop severe weakness, fainting, or chills.  You have a fever. THIS IS AN EMERGENCY. Do not wait to see if the pain will go away. Get medical help at once. Call your local emergency services (911 in [place] Do not drive yourself to the hospital. MAKE SURE YOU:   Understand these instructions.  Will watch your condition.  Will get help right away if you are not doing well or get worse. Document Released: 03/22/2005 Document Revised: 09/04/2011 Document Reviewed: 01/16/2008 [facility] Patient Information 2014 [place], [place].

## 2013-04-18 ENCOUNTER — Telehealth: Payer: Self-pay | Admitting: *Deleted

## 2013-04-18 ENCOUNTER — Other Ambulatory Visit: Payer: Self-pay | Admitting: Family Medicine

## 2013-04-18 MED ORDER — NITROGLYCERIN 0.4 MG SL SUBL: 0.4000 mg | SUBLINGUAL_TABLET | SUBLINGUAL | Status: DC | PRN

## 2013-04-18 NOTE — Telephone Encounter (Signed)
Returned call and pt verified x 2.  Pt c/o intermittent CP and almost out of NTG.  Denied CP today.  Stated her bottle is pretty "beat up."  RN asked pt how old NTG is and pt stated "it's pretty old."  Pt advised to discard med as it may be expired.  Pt informed refill will be sent and advised ER if she has to take 3 NTG in 15 mins or appt if have to take 2-3 times in one week.  Pt also advised to keep NTG in light-resistant bottle to preserve effectiveness.  Pt verbalized understanding and agreed w/ plan.  Pt requested Dr. Royann Shivers to be her new cardiologist and informed schedulers will be notified to enter a recall for her appt next year.  Pt verbalized understanding and agreed w/ plan.  Recall for Dr. Royann Shivers entered for Aug. 2015 per Willaim Sheng, Scheduling.  Refill(s) sent to pharmacy.

## 2013-04-18 NOTE — Telephone Encounter (Signed)
Pt is calling bc she said that she is out of Nitro and she has been having some chest pain.

## 2013-04-19 LAB — URINE CULTURE: Organism ID, Bacteria: NO GROWTH

## 2013-04-20 ENCOUNTER — Encounter: Payer: Self-pay | Admitting: Family Medicine

## 2013-04-20 NOTE — Assessment & Plan Note (Addendum)
At goal. Continue nebivolol. Patient to keep follow-up with cardiologist regarding chest pain. Given red flags for return to care.

## 2013-04-20 NOTE — Assessment & Plan Note (Addendum)
Refilled patients diflucan and colestid. Will need to check a CMET while on diflucan. Will have patient return to clinic to have this lab drawn.

## 2013-04-20 NOTE — Progress Notes (Signed)
Patient ID: [name], female   DOB: 1941/01/13, 72 y.o.   MRN: [id number] [name] is a 72 y.o. female who presents today for f/u of HTN and previous UTI.  UTI: has had continued dysuria since July. Has been treated for UTI on multiple occasions though dysuria does not seem to go away. It is improved at this time. She denies frequency. Endorses urgency since she had a bladder sling placed many years ago.  HYPERTENSION Disease Monitoring Home BP Monitoring not checking Chest pain- yes, day prior to visit. Central sharp in nature associated with some shortness of breath. Resolved with nitro. No radiation or diaphoresis. Notes that she has an appointment with cardiology on 05/01/13. Per chart review patient had a cath with non-critical stenosis in 3/10 and negative myoview in 9/13.  Dyspnea- see chest pain, otherwise no dyspnea Medications Compliance-  Taking. Lightheadedness-  no  Edema- no  Chronic diarrhea: continues to have issues with this. States had ulcerative colitis and had most of her colon removed. Notes colestid and diflucan help with this and are the only things that have helped to this point. Wants refills for these medications.   Past Medical History  Diagnosis Date  . Hypertension   . Arthritis   . Asthma   . Blood transfusion   . Cancer   . Coronary artery disease   . Chronic diarrhea     History  Smoking status  . Former Smoker  Smokeless tobacco  . Never Used    No family history on file.  Current Outpatient Prescriptions on File Prior to Visit  Medication Sig Dispense Refill  . aspirin 81 MG tablet Take 81 mg by mouth daily.      . cetirizine (ZYRTEC ALLERGY) 10 MG tablet Take 10 mg by mouth daily.      . ciprofloxacin (CIPRO) 250 MG tablet Take 1 tablet (250 mg total) by mouth 2 (two) times daily.  6 tablet  0  . clotrimazole-betamethasone (LOTRISONE) cream       . CRESTOR 10 MG tablet       . esomeprazole (NEXIUM) 40 MG capsule Take 1 capsule (40 mg  total) by mouth daily before breakfast.  90 capsule  1  . folic acid (FOLVITE) 1 MG tablet Take 1 mg by mouth daily.      . furosemide (LASIX) 20 MG tablet Take 20 mg by mouth daily as needed.      Marland Kitchen glucosamine-chondroitin 500-400 MG tablet Take 1 tablet by mouth 3 (three) times daily as needed.      . hydrOXYzine (ATARAX/VISTARIL) 10 MG tablet Take 1 tablet (10 mg total) by mouth daily with breakfast. Takes 10 mg qAM and 25 mg qHS  30 tablet  2  . iron polysaccharides (NU-IRON) 150 MG capsule Take 150 mg by mouth daily.      Marland Kitchen KLOR-CON 10 10 MEQ tablet       . lactobacillus acidophilus & bulgar (LACTINEX) chewable tablet Chew 1 tablet by mouth 3 (three) times daily with meals.  90 tablet  0  . Multiple Vitamins-Minerals (CENTRUM SILVER ULTRA WOMENS PO) Take 1 tablet by mouth every morning.      . Omega-3 Fat Ac-Cholecalciferol (OMEGA ESSENTIALS/VIT D3 PO) Take 1 capsule by mouth every morning.      . phenazopyridine (PYRIDIUM) 200 MG tablet TAKE 1 TABLET BY MOUTH THREE TIMES DAILY AS NEEDED FOR PAIN  90 tablet  0  . potassium chloride 20 MEQ/15ML (10%) solution Take  7.5 mLs (10 mEq total) by mouth daily.  500 mL  0  . SINGULAIR 10 MG tablet TAKE 1 TABLET BY MOUTH AT BEDTIME  30 tablet  0  . SYMBICORT 160-4.5 MCG/ACT inhaler INHALE 2 PUFFS BY MOUTH TWICE DAILY  10.2 g  0  . traMADol (ULTRAM) 50 MG tablet Take 1-2 tablets (50-100 mg total) by mouth 2 (two) times daily as needed for pain.  90 tablet  1  . valsartan (DIOVAN) 80 MG tablet Take 80 mg by mouth daily.      Marland Kitchen zolpidem (AMBIEN CR) 12.5 MG CR tablet Take 1 tablet (12.5 mg total) by mouth at bedtime as needed for sleep.  15 tablet  3   No current facility-administered medications on file prior to visit.    ROS: Per HPI   Physical Exam Filed Vitals:   04/17/13 1436  BP: 150/82  Pulse: 77  Temp: 98 F (36.7 C)    Physical Examination: General appearance - alert, well appearing, and in no distress Chest - clear to auscultation,  no wheezes, rales or rhonchi, symmetric air entry Heart - normal rate, regular rhythm, normal S1, S2, no murmurs, rubs, clicks or gallops Abdomen - soft, nontender, nondistended, no masses or organomegaly Extremities - no pedal edema noted  Assessment/Plan: Please see individual problem list.  I have spent >25 minutes in the care of this patient with >50% spent in counseling/coordination of care regarding dysuria/UTI, HTN, and chronic diarrhea.

## 2013-04-20 NOTE — Assessment & Plan Note (Signed)
Patient with continued dysuria. UA with positive nitrites, though culture negative. Will need to assess additional causes if this continues in to the future.

## 2013-04-21 ENCOUNTER — Telehealth: Payer: Self-pay | Admitting: *Deleted

## 2013-04-21 NOTE — Progress Notes (Signed)
appt made for tomorrow morning. Fleeger, [name]

## 2013-04-21 NOTE — Telephone Encounter (Signed)
Left detailed message on identified VM at pt request.  [facility]

## 2013-04-21 NOTE — Telephone Encounter (Signed)
Message copied by Henri Medal on Mon Apr 21, 2013  8:42 AM ------      Message from: Birdie Sons, ERIC G      Created: Sun Apr 20, 2013  6:38 PM       Patient without infection on culture. There is no need for antibiotics at this time. If she continues to have discomfort with urination we will need to evaluate this further. ------

## 2013-04-22 ENCOUNTER — Other Ambulatory Visit: Payer: Medicare Other

## 2013-04-22 ENCOUNTER — Telehealth: Payer: Self-pay | Admitting: Family Medicine

## 2013-04-22 DIAGNOSIS — K529 Noninfective gastroenteritis and colitis, unspecified: Secondary | ICD-10-CM

## 2013-04-22 LAB — COMPREHENSIVE METABOLIC PANEL
AST: 22 U/L (ref 0–37)
Albumin: 4 g/dL (ref 3.5–5.2)
Alkaline Phosphatase: 106 U/L (ref 39–117)
BUN: 25 mg/dL — ABNORMAL HIGH (ref 6–23)
CO2: 26 mEq/L (ref 19–32)
Calcium: 9.5 mg/dL (ref 8.4–10.5)
Chloride: 99 mEq/L (ref 96–112)
Glucose, Bld: 85 mg/dL (ref 70–99)
Potassium: 4.2 mEq/L (ref 3.5–5.3)
Total Bilirubin: 0.3 mg/dL (ref 0.3–1.2)
Total Protein: 6.3 g/dL (ref 6.0–8.3)

## 2013-04-22 NOTE — Telephone Encounter (Signed)
Rx called into [facility]. Jazmin Hartsell,CMA

## 2013-04-22 NOTE — Telephone Encounter (Signed)
Pharmacy has not received prescription for Lomotil.  Please resend.

## 2013-04-22 NOTE — Progress Notes (Signed)
CMP DONE TODAY [name] 

## 2013-04-23 ENCOUNTER — Telehealth: Payer: Self-pay | Admitting: *Deleted

## 2013-04-23 NOTE — Telephone Encounter (Signed)
Prior authorization form for Colestid placed in MD box for completion.

## 2013-04-24 ENCOUNTER — Telehealth: Payer: Self-pay | Admitting: Family Medicine

## 2013-04-24 NOTE — Telephone Encounter (Signed)
Pt called and would like someone to call her about her lab results. Also [facility] would like a copy of this also. JW

## 2013-04-24 NOTE — Telephone Encounter (Signed)
Will forward to MD. Jazmin Hartsell,CMA  

## 2013-04-25 NOTE — Telephone Encounter (Signed)
Attempted to call patient to talk about her lab results. Left message for the patient. They appear to be around her baseline. Please send a copy to [facility] as well. Thanks.

## 2013-04-29 NOTE — Telephone Encounter (Signed)
Called pt, states that she already spoke with MD.  Notes faxed to [facility]. Fleeger, [name]

## 2013-05-01 ENCOUNTER — Telehealth: Payer: Self-pay | Admitting: Family Medicine

## 2013-05-01 NOTE — Telephone Encounter (Signed)
The request for colestid has been approved for  04-30-13- thru 04-30-14.

## 2013-05-02 MED ORDER — COLESTIPOL HCL 5 G PO PACK: 5.0000 g | PACK | Freq: Four times a day (QID) | ORAL | Status: DC | PRN

## 2013-05-02 NOTE — Telephone Encounter (Signed)
Changed prescription to reflect 4 times daily prn dosing.

## 2013-05-02 NOTE — Telephone Encounter (Signed)
Message copied by Glori Luis on Fri May 02, 2013  7:13 PM ------      Message from: Henri Medal      Created: Thu May 01, 2013 10:23 AM      Regarding: RE: Patient insurance       Pt states that she spoke with insurance.  Also that her rx for colestid needs to be rewritten to say she can take up to 20G a day. Jazmin Hartsell,CMA            ----- Message -----         From: Glori Luis, MD         Sent: 04/30/2013   5:39 PM           To: [facility]      Subject: Patient insurance                                        Please let Ms Taber know I received a fax regarding her prescription coverage having expired and that she should contact member services by calling the number on the back of her ID card to discuss this with the coverage company.      Thanks, Minerva Areola.       ------

## 2013-05-16 ENCOUNTER — Other Ambulatory Visit: Payer: Self-pay | Admitting: Family Medicine

## 2013-05-27 ENCOUNTER — Telehealth: Payer: Self-pay | Admitting: Family Medicine

## 2013-05-27 NOTE — Telephone Encounter (Signed)
Pt called and needs refills sent to [facility] in [facility] [phone], she is out of town. The refills are for flucronazole, diphenoxylate, Zolpidem. jw

## 2013-05-28 MED ORDER — DIPHENOXYLATE-ATROPINE 2.5-0.025 MG PO TABS: 1.0000 | ORAL_TABLET | Freq: Four times a day (QID) | ORAL | Status: DC | PRN

## 2013-05-28 MED ORDER — FLUCONAZOLE 100 MG PO TABS: 100.0000 mg | ORAL_TABLET | Freq: Every day | ORAL | Status: DC | PRN

## 2013-05-28 NOTE — Telephone Encounter (Signed)
Will refill lomotil and fluconazole. The patient has to be seen in person to refill ambien as it is a controlled substance. Please advise the patient of this. Thanks.

## 2013-05-29 NOTE — Telephone Encounter (Signed)
Pt states that she will be back in town next week.  Informed her that your next available is not until 06-25-13.  She said that she will call later next week and see if she can get in with someone else.  Jazmin Hartsell,CMA

## 2013-06-11 ENCOUNTER — Other Ambulatory Visit: Payer: Self-pay | Admitting: Family Medicine

## 2013-07-11 ENCOUNTER — Other Ambulatory Visit: Payer: Self-pay | Admitting: Family Medicine

## 2013-07-21 ENCOUNTER — Other Ambulatory Visit: Payer: Self-pay | Admitting: Family Medicine

## 2013-07-23 ENCOUNTER — Telehealth: Payer: Self-pay | Admitting: Family Medicine

## 2013-07-23 MED ORDER — FLUCONAZOLE 100 MG PO TABS: 100.0000 mg | ORAL_TABLET | Freq: Every day | ORAL | Status: DC | PRN

## 2013-07-23 MED ORDER — PHENAZOPYRIDINE HCL 200 MG PO TABS: ORAL_TABLET | ORAL | Status: DC

## 2013-07-23 NOTE — Telephone Encounter (Signed)
Pt requested 90 day supply for pyridium.   Before I call, were you going to call in a 90 day supply, or should I tell her differently. [name], Maryjo RochesterJessica Dawn

## 2013-07-23 NOTE — Telephone Encounter (Signed)
Pt called and needs refills on her medications Diflucan and Phenazopyridine. Please make sure to order Phenazopyridine in 90 day qty because she has to pay out of pocket and it is cheaper at the 90 day price. jw

## 2013-07-23 NOTE — Telephone Encounter (Signed)
Refilled medications. Sent to pharmacy.

## 2013-07-23 NOTE — Telephone Encounter (Signed)
The pyridium should be a 90 day supply. The diflucan was written for 30 day supply.

## 2013-07-23 NOTE — Telephone Encounter (Signed)
Can you come see me about this when you get a chance? Milas Gain. Teodor Prater, Maryjo RochesterJessica Dawn

## 2013-07-24 MED ORDER — PHENAZOPYRIDINE HCL 200 MG PO TABS: ORAL_TABLET | ORAL | Status: DC

## 2013-07-24 NOTE — Addendum Note (Signed)
Addended by: Glori LuisSONNENBERG, Ajayla Iglesias G on: 07/24/2013 08:44 AM   Modules accepted: Orders

## 2013-07-24 NOTE — Telephone Encounter (Signed)
Redid the prescription to be for a 90 day supply. Thanks.

## 2013-07-24 NOTE — Telephone Encounter (Signed)
Pt informed. [name] Dawn  

## 2013-07-31 ENCOUNTER — Telehealth: Payer: Self-pay | Admitting: Cardiovascular Disease

## 2013-07-31 NOTE — Telephone Encounter (Signed)
Returned call and pt verified x 2.  Pt stated she [facility] to [facility].  Pt informed JC is seeing patients right now.  Pt stated she needs to have stents placed and she wants to know who JC recommends.  Stated she wants to see Dr. Jacinto HalimGanji and b/c he puts her stents in before.  Pt aware he is not at this practice anymore.  Pt informed she can be seen by one of the other providers now that Dr. Alanda AmassWeintraub has retired.  Pt stated [facility].  Stated she walked to the mailbox and is SOB.  Stated she knows she needs to see someone, but wants to talk to JC first.  Pt informed he will be notified to call her and advised she go to ER for severe symptoms or call back for appt w/ any provider for evaluation.  Pt verbalized understanding and agreed w/ plan.  Message forwarded to [facility] Berlinda LastWildman, LPN.

## 2013-07-31 NOTE — Telephone Encounter (Signed)
Talked to pt

## 2013-07-31 NOTE — Telephone Encounter (Signed)
Please have J C to call her,says she need to talk to him.

## 2013-08-01 ENCOUNTER — Telehealth: Payer: Self-pay | Admitting: Family Medicine

## 2013-08-01 MED ORDER — ESOMEPRAZOLE MAGNESIUM 40 MG PO CPDR: 40.0000 mg | DELAYED_RELEASE_CAPSULE | Freq: Every day | ORAL | Status: DC

## 2013-08-01 NOTE — Telephone Encounter (Signed)
Refill sent in

## 2013-08-01 NOTE — Telephone Encounter (Signed)
Pt called and would like a [place]ill on [place]ium [place] [facility] [place] [phone]. jw

## 2013-08-14 ENCOUNTER — Other Ambulatory Visit: Payer: Self-pay | Admitting: Family Medicine

## 2013-08-14 ENCOUNTER — Ambulatory Visit: Payer: Medicare Other | Admitting: Family Medicine

## 2013-08-16 NOTE — Telephone Encounter (Signed)
One month of refills given. Patient needs to be advised she should come in for a f/u appointment as soon as she can.

## 2013-08-18 NOTE — Telephone Encounter (Signed)
Appt on 08/20/13. Fleeger, [name]

## 2013-08-20 ENCOUNTER — Ambulatory Visit: Payer: Medicare Other | Admitting: Family Medicine

## 2013-09-13 ENCOUNTER — Other Ambulatory Visit: Payer: Self-pay | Admitting: Family Medicine

## 2013-09-15 NOTE — Telephone Encounter (Signed)
Refill for one month given. Please advise patient that she should follow-up in the office in the next month. Thanks.

## 2013-09-16 NOTE — Telephone Encounter (Signed)
appt 3/27 [name], Megan RochesterJessica [name]

## 2013-09-19 ENCOUNTER — Ambulatory Visit (INDEPENDENT_AMBULATORY_CARE_PROVIDER_SITE_OTHER): Payer: Medicare Other | Admitting: Family Medicine

## 2013-09-19 ENCOUNTER — Encounter: Payer: Self-pay | Admitting: Family Medicine

## 2013-09-19 VITALS — BP 165/74 | HR 76 | Temp 98.4°F | Ht 61.0 in | Wt 151.0 lb

## 2013-09-19 DIAGNOSIS — B351 Tinea unguium: Secondary | ICD-10-CM

## 2013-09-19 DIAGNOSIS — J449 Chronic obstructive pulmonary disease, unspecified: Secondary | ICD-10-CM

## 2013-09-19 DIAGNOSIS — J4489 Other specified chronic obstructive pulmonary disease: Secondary | ICD-10-CM

## 2013-09-19 DIAGNOSIS — I10 Essential (primary) hypertension: Secondary | ICD-10-CM

## 2013-09-19 DIAGNOSIS — R079 Chest pain, unspecified: Secondary | ICD-10-CM

## 2013-09-19 MED ORDER — CICLOPIROX 8 % EX KIT: 1.0000 "application " | PACK | Freq: Every day | CUTANEOUS | Status: DC

## 2013-09-19 NOTE — Patient Instructions (Addendum)
Nice to see you. Please use the topical medication for your finger nails. You should take your dulera every day. Please call Dr Verl DickerGanji's office to let them know about the chest pain you've been having.

## 2013-09-19 NOTE — Progress Notes (Signed)
Patient ID: [name], female   DOB: 05-04-1941, 73 y.o.   MRN: [id number]  Megan AlarEric Belynda Pagaduan, MD Phone: [phone]  [name] is a 73 y.o. female who presents today for f/u.  Finger nails: this is the patients main complaint today. She states for the past 3-6 months her finger nails have been thin and cracking. They hurt when they break off. She notes previously her toenails appeared like this, though they improved after taking fluconazole. She has not tried anything for her finger nails.  HYPERTENSION Disease Monitoring Home BP Monitoring not checking Chest pain- yes, see below    Dyspnea- yes, see below Medications Compliance-  Taking though not yet today, states Dr Jacinto HalimGanji started her on a new medication the day prior to this visit.  Edema- no  Chest pain: patient notes a sharp pain in her left lower chest. It has been occurring intermittently for the past 2-3 months. It does not radiate. It can occur at any time. It is relieved by nitro. She states she saw Dr Jacinto HalimGanji the day prior to this office visit and did not mention this chest pain. She does not currently have chest pain.  Shortness of breath: patient reports some intermittent shortness of breath. She reports to me that she has COPD and is on medications for this. [place] She has only been taking her dulera prn and did not realize that she needs to take it daily. Uses albuterol when needed as well. She is not currently short of breath.  Patient is a former smoker.   ROS: Per HPI   Physical Exam Filed Vitals:   09/19/13 0835  BP: 165/74  Pulse: 76  Temp: 98.4 F (36.9 C)  91% ambulatory O2 sat on RA  Physical Examination: General appearance - alert, well appearing, and in no distress Chest - clear to auscultation, no wheezes, rales or rhonchi, symmetric air entry Heart - normal rate, regular rhythm, normal S1, S2, no murmurs, rubs, clicks or gallops Skin -  NAILS: normal nails without lesions and onychomycosis of the fingernails   Assessment/Plan: Please see individual problem list.

## 2013-09-21 DIAGNOSIS — R079 Chest pain, unspecified: Secondary | ICD-10-CM | POA: Insufficient documentation

## 2013-09-21 DIAGNOSIS — B351 Tinea unguium: Secondary | ICD-10-CM | POA: Insufficient documentation

## 2013-09-21 DIAGNOSIS — R0602 Shortness of breath: Secondary | ICD-10-CM | POA: Insufficient documentation

## 2013-09-21 NOTE — Assessment & Plan Note (Signed)
Patient with atypical and typical aspects. Given history and relief with nitro, concern would be that this could be related to her heart. She reports seeing Dr Jacinto HalimGanji the day prior to this visit, though she states she did not talk about her chest pain. I advised her to call Dr Verl DickerGanji's office to let him know about the chest pain and her use of nitro. Given return precautions.

## 2013-09-21 NOTE — Assessment & Plan Note (Addendum)
Patient reports history of COPD today, though not previously documented. Per review of notes she may have asthma vs COPD. She has not been using her dulera appropriately and was advised to use this daily as this miss-use is likely contributing to her shortness of breath. Additionally she was advised to contact Dr Jacinto HalimGanji regarding her chest pains as this may be contributing to the shortness of breath. F/u in one month.

## 2013-09-21 NOTE — Assessment & Plan Note (Signed)
Not at goal today. Has not taken medications. Advised to take medications daily. Will continue current regimen at this time. F/u one month.

## 2013-09-21 NOTE — Assessment & Plan Note (Signed)
Of finger nails. Will give trial of ciclopirox. If no improvement, may need to consider lamisil PO. F/u one month.

## 2013-10-11 ENCOUNTER — Other Ambulatory Visit: Payer: Self-pay | Admitting: Family Medicine

## 2013-10-13 NOTE — Telephone Encounter (Signed)
Medication refilled. Called patient to discuss if she called Dr Jacinto HalimGanji for follow-up after our most recent visit given she had been having episodes of chest pain. She states she called and has an appointment on 10/20/13 with Dr Jacinto HalimGanji. She states she has not been having the chest pain since out recent visit. I reminded her to discuss this with Dr Jacinto HalimGanji when she see's him.

## 2013-11-03 ENCOUNTER — Telehealth: Payer: Self-pay | Admitting: Family Medicine

## 2013-11-03 NOTE — Telephone Encounter (Signed)
Pt called and needs a refill on her Diflucan called in. jw °

## 2013-11-04 MED ORDER — FLUCONAZOLE 100 MG PO TABS: 100.0000 mg | ORAL_TABLET | Freq: Every day | ORAL | Status: DC | PRN

## 2013-11-04 NOTE — Telephone Encounter (Signed)
LM for patient that "rx you requested yesterday was called in to pharmacy on [address]".  Brianca Fortenberry,CMA

## 2013-11-04 NOTE — Telephone Encounter (Signed)
Diflucan refilled. Please inform the patient. Thanks.

## 2013-11-05 ENCOUNTER — Other Ambulatory Visit: Payer: Self-pay | Admitting: Family Medicine

## 2013-11-07 ENCOUNTER — Other Ambulatory Visit: Payer: Self-pay | Admitting: Family Medicine

## 2013-11-18 ENCOUNTER — Other Ambulatory Visit: Payer: Self-pay | Admitting: Family Medicine

## 2013-11-20 ENCOUNTER — Other Ambulatory Visit: Payer: Self-pay | Admitting: Family Medicine

## 2013-11-20 NOTE — Telephone Encounter (Signed)
Prescription was called in by me for the patient. Please inform the patient of this. Thanks.

## 2013-12-15 ENCOUNTER — Telehealth: Payer: Self-pay | Admitting: Family Medicine

## 2013-12-15 NOTE — Telephone Encounter (Signed)
Pt called and needs a refill on her Mobic. jw °

## 2013-12-16 MED ORDER — MOBIC 15 MG PO TABS: ORAL_TABLET | ORAL | Status: DC

## 2013-12-16 NOTE — Telephone Encounter (Signed)
Refill sent to pharmacy.   

## 2013-12-18 ENCOUNTER — Other Ambulatory Visit: Payer: Self-pay | Admitting: Family Medicine

## 2014-01-06 ENCOUNTER — Ambulatory Visit: Payer: Medicare Other | Admitting: Pharmacist

## 2014-01-06 ENCOUNTER — Ambulatory Visit: Payer: Medicare Other | Admitting: Family Medicine

## 2014-01-16 ENCOUNTER — Other Ambulatory Visit: Payer: Self-pay | Admitting: Family Medicine

## 2014-01-16 NOTE — Telephone Encounter (Signed)
Will refill nexium for one month.  Megan SingletonMaria T Mavin Dyke, MD

## 2014-01-27 ENCOUNTER — Telehealth: Payer: Self-pay | Admitting: Cardiovascular Disease

## 2014-01-27 NOTE — Telephone Encounter (Signed)
New Prob   Pt states she has been experiencing new intermittent dizziness and nausea onset 2 weeks. Requesting to speak to a nurse. Please call.

## 2014-01-27 NOTE — Telephone Encounter (Signed)
Please call this pt she needs an appt with Dr. Royann Shiversroitoru or an extender on a day Dr. Royann Shiversroitoru is [place] the [place]

## 2014-01-28 ENCOUNTER — Telehealth: Payer: Self-pay | Admitting: Cardiovascular Disease

## 2014-01-29 NOTE — Telephone Encounter (Signed)
Closed Enounter  °

## 2014-02-09 ENCOUNTER — Ambulatory Visit: Payer: Medicare Other | Admitting: Family Medicine

## 2014-02-18 ENCOUNTER — Other Ambulatory Visit: Payer: Self-pay | Admitting: Physician Assistant

## 2014-02-18 ENCOUNTER — Encounter: Payer: Self-pay | Admitting: Physician Assistant

## 2014-02-18 ENCOUNTER — Ambulatory Visit (INDEPENDENT_AMBULATORY_CARE_PROVIDER_SITE_OTHER): Payer: Medicare Other | Admitting: Physician Assistant

## 2014-02-18 ENCOUNTER — Encounter (HOSPITAL_COMMUNITY): Payer: Self-pay | Admitting: *Deleted

## 2014-02-18 VITALS — BP 160/80 | HR 68 | Ht 61.0 in | Wt 148.0 lb

## 2014-02-18 DIAGNOSIS — I6529 Occlusion and stenosis of unspecified carotid artery: Secondary | ICD-10-CM

## 2014-02-18 DIAGNOSIS — R0602 Shortness of breath: Secondary | ICD-10-CM

## 2014-02-18 DIAGNOSIS — I6523 Occlusion and stenosis of bilateral carotid arteries: Secondary | ICD-10-CM

## 2014-02-18 DIAGNOSIS — R079 Chest pain, unspecified: Secondary | ICD-10-CM

## 2014-02-18 DIAGNOSIS — R42 Dizziness and giddiness: Secondary | ICD-10-CM

## 2014-02-18 DIAGNOSIS — I658 Occlusion and stenosis of other precerebral arteries: Secondary | ICD-10-CM

## 2014-02-18 DIAGNOSIS — I701 Atherosclerosis of renal artery: Secondary | ICD-10-CM

## 2014-02-18 DIAGNOSIS — I10 Essential (primary) hypertension: Secondary | ICD-10-CM

## 2014-02-18 MED ORDER — VALSARTAN 80 MG PO TABS: 80.0000 mg | ORAL_TABLET | Freq: Every day | ORAL | Status: DC

## 2014-02-18 MED ORDER — MECLIZINE HCL 32 MG PO TABS: 32.0000 mg | ORAL_TABLET | Freq: Three times a day (TID) | ORAL | Status: DC | PRN

## 2014-02-18 NOTE — Progress Notes (Signed)
Date:  02/18/2014   ID:  LATALIA ETZLER, DOB 1940/09/25, MRN [id number]  PCP:  Tommi Rumps, MD  Primary Cardiologist:  [place]     History of Present Illness: [name] is a 73 y.o. female with a history of remote tobacco use (quit smoking in 1990), alcoholism (quit drinking 20 years ago), asthma, COPD, hyperlipidemia, bilateral renal artery stenosis, PAD, hypertension, chronic venous insufficiency, noncritical coronary artery disease by cath March 2010 with a 40% proximal mid right, 40% circumflex, 40% to 50% LAD. 2 negative Myoview in September 2013.  Her last echocardiogram was September 2013 and showed normal LV function and overall is essentially normal.  She had surgery in 2007 for followup obstruction. Remote open cholecystectomy in 1998.  She had high grade left subclavian stenosis and had PTA and stenting in April 2010. She had right renal artery stenting in 2010.  Her last renal Dopplers were September 2014 showed increased velocities on the left. She also had carotid Dopplers at that time which showed no significant stenosis.  Patient presents today with complaints of dizziness that has been going on just about all summer. It appears to be worse with movement of her head. She reports she feels dizzy right now will exam room and her EKG shows sinus rhythm with first degree AV block and a rate of 68 beats per minute. She does have known carotid artery stenosis.  She also reports that on Monday her heart was racing. She's been having chest pain now for about one year it comes and goes. To be sharp in nature and is not necessarily occur with exertion. She's been seeing Dr. Nadyne Coombes who about 2 months ago but her on spironolactone. She first to see Dr. Sallyanne Kuster.  The patient currently denies nausea, vomiting, fever, shortness of breath, orthopnea, PND, cough, congestion, abdominal pain, hematochezia, melena, lower extremity edema, claudication.  Wt Readings from Last 3 Encounters:    02/18/14 148 lb (67.132 kg)  09/19/13 151 lb (68.493 kg)  04/17/13 145 lb (65.772 kg)     Past Medical History  Diagnosis Date  . Hypertension   . Arthritis   . Asthma   . Blood transfusion   . Cancer   . Coronary artery disease   . Chronic diarrhea     Current Outpatient Prescriptions  Medication Sig Dispense Refill  . aspirin 81 MG tablet Take 81 mg by mouth daily.      Marland Kitchen BYSTOLIC 5 MG tablet TAKE 1 TABLET BY MOUTH DAILY  30 tablet  0  . cetirizine (ZYRTEC ALLERGY) 10 MG tablet Take 10 mg by mouth daily.      Marland Kitchen CICLODAN 8 % KIT APPLY 1 APPLICATION DAILY TO AFFECTED NAILS AND ADJACENT SKIN, REMOVE WITH ALCOHOL EVERY 7 DAYS  34.6 each  0  . clotrimazole-betamethasone (LOTRISONE) cream       . colestipol (COLESTID) 5 G packet Take 5 g by mouth 4 (four) times daily as needed (diarrhea).  30 each  12  . CRESTOR 10 MG tablet       . diphenoxylate-atropine (LOMOTIL) 2.5-0.025 MG per tablet Take 1 tablet by mouth 4 (four) times daily as needed for diarrhea or loose stools.  30 tablet  0  . fluconazole (DIFLUCAN) 100 MG tablet Take 1 tablet (100 mg total) by mouth daily as needed.  30 tablet  2  . folic acid (FOLVITE) 1 MG tablet Take 1 mg by mouth daily.      . furosemide (LASIX)  20 MG tablet Take 20 mg by mouth daily as needed.      Marland Kitchen glucosamine-chondroitin 500-400 MG tablet Take 1 tablet by mouth 3 (three) times daily as needed.      . hydrOXYzine (ATARAX/VISTARIL) 10 MG tablet Take 1 tablet (10 mg total) by mouth daily with breakfast. Takes 10 mg qAM and 25 mg qHS  30 tablet  2  . iron polysaccharides (NU-IRON) 150 MG capsule Take 150 mg by mouth daily.      Marland Kitchen KLOR-CON 10 10 MEQ tablet       . lactobacillus acidophilus & bulgar (LACTINEX) chewable tablet Chew 1 tablet by mouth 3 (three) times daily with meals.  90 tablet  0  . MOBIC 15 MG tablet TAKE 1 TABLET BY MOUTH EVERY DAY  30 tablet  2  . Multiple Vitamins-Minerals (CENTRUM SILVER ULTRA WOMENS PO) Take 1 tablet by mouth every  morning.      Marland Kitchen NEXIUM 40 MG capsule TAKE ONE CAPSULE BY MOUTH EVERY DAY  90 capsule  0  . nitroGLYCERIN (NITROSTAT) 0.4 MG SL tablet Place 1 tablet (0.4 mg total) under the tongue every 5 (five) minutes as needed for chest pain. No more than 3 tabs in 15 mins.  25 tablet  1  . Omega-3 Fat Ac-Cholecalciferol (OMEGA ESSENTIALS/VIT D3 PO) Take 1 capsule by mouth every morning.      . phenazopyridine (PYRIDIUM) 200 MG tablet TAKE 1 TABLET BY MOUTH THREE TIMES DAILY AS NEEDED FOR PAIN  270 tablet  1  . potassium chloride 20 MEQ/15ML (10%) solution Take 7.5 mLs (10 mEq total) by mouth daily.  500 mL  0  . SINGULAIR 10 MG tablet TAKE 1 TABLET BY MOUTH EVERY NIGHT AT BEDTIME  30 tablet  3  . SYMBICORT 160-4.5 MCG/ACT inhaler INHALE 2 PUFFS BY MOUTH TWICE DAILY  10.2 g  0  . traMADol (ULTRAM) 50 MG tablet TAKE 1 TABLET BY MOUTH TWICE DAILY AS NEEDED FOR PAIN  60 tablet  1  . valsartan (DIOVAN) 80 MG tablet Take 1 tablet (80 mg total) by mouth daily.  30 tablet  5  . zolpidem (AMBIEN CR) 12.5 MG CR tablet Take 1 tablet (12.5 mg total) by mouth at bedtime as needed for sleep.  15 tablet  3  . meclizine (ANTIVERT) 32 MG tablet Take 1 tablet (32 mg total) by mouth 3 (three) times daily as needed.  60 tablet  3   No current facility-administered medications for this visit.    Allergies:    Allergies  Allergen Reactions  . Codeine Anaphylaxis  . Losartan Shortness Of Breath    Throat swells up, choking  . Meloxicam Other (See Comments)    REACTION: Diarrehea with generic.  No problems with brand per pt.  . Rosuvastatin     REACTION: Pt reports she had a bad reaction, unsure what it was.  . Sulfonamide Derivatives Other (See Comments)    Reaction=choking    Social History:  The patient  reports that she has quit smoking. She has never used smokeless tobacco. She reports that she does not drink alcohol.   Family history:  History reviewed. No pertinent family history.  ROS:  Please see the history  of present illness.  All other systems reviewed and negative.   PHYSICAL EXAM: VS:  BP 160/80  Pulse 68  Ht '5\' 1"'  (1.549 m)  Wt 148 lb (67.132 kg)  BMI 27.98 kg/m2 Well nourished, well developed, in no acute distress  HEENT: Pupils are equal round react to light accommodation extraocular movements are intact. Intact membranes are clear without erythema. Neck: no JVDNo cervical lymphadenopathy. Cardiac: Regular rate and rhythm without murmurs rubs or gallops. Lungs:  clear to auscultation bilaterally, no wheezing, rhonchi or rales Abd: soft, nontender, positive bowel sounds all quadrants, no hepatosplenomegaly Ext: no lower extremity edema.  2+ radial and dorsalis pedis pulses. Skin: warm and dry Neuro:  Grossly normal  EKG: Sinus rhythm first degree AV block, 60 beats per minute     ASSESSMENT AND PLAN:  Problem List Items Addressed This Visit   HYPERTENSION, BENIGN SYSTEMIC     His blood pressure is elevated. She has not been taking the Diovan on a consistent basis. I have sent a new prescription and she will start taking today..    Relevant Medications      valsartan (DIOVAN) tablet   RENAL ARTERY STENOSIS     Repeat renal artery Dopplers.   This is her annual check.    Relevant Medications      valsartan (DIOVAN) tablet   Chest pain     Patient's complaint of chest pain for over a year now. Says it comes and goes it's not particularly intense it doesn't necessarily come on with exertion. EKG shows no acute changes to suggest ischemia. Her last stress test was September 2013 showed ejection fraction of 80% and normal perfusion pattern.    Relevant Orders      EKG 12-Lead   Dizziness     Patient reports she's been dizzy for the whole summer practically and she was dizzy while she was in the office. This is worse it appears with head movement. EKG shows sinus rhythm. First degree AV block and a rate of 60 beats per minute. I started her on meclizine.    Relevant Medications       meclizine (ANTIVERT) tablet   Other Relevant Orders      Doppler carotid   Carotid artery stenosis     Bruit on the right is louder than the left. We'll order carotid Dopplers.    Relevant Medications      valsartan (DIOVAN) tablet    Other Visit Diagnoses   SOB (shortness of breath)    -  Primary    Relevant Orders       EKG 12-Lead    Renal artery stenosis        Relevant Medications       valsartan (DIOVAN) tablet    Other Relevant Orders       Renal Artery Duplex Bilateral

## 2014-02-18 NOTE — Telephone Encounter (Signed)
Rx refill sent to patient pharmacy   

## 2014-02-18 NOTE — Assessment & Plan Note (Signed)
Patient reports she's been dizzy for the whole summer practically and she was dizzy while she was in the office. This is worse it appears with head movement. EKG shows sinus rhythm. First degree AV block and a rate of 60 beats per minute. I started her on meclizine.

## 2014-02-18 NOTE — Assessment & Plan Note (Signed)
Bruit on the right is louder than the left. We'll order carotid Dopplers.

## 2014-02-18 NOTE — Assessment & Plan Note (Signed)
Patient's complaint of chest pain for over a year now. Says it comes and goes it's not particularly intense it doesn't necessarily come on with exertion. EKG shows no acute changes to suggest ischemia. Her last stress test was September 2013 showed ejection fraction of 80% and normal perfusion pattern.

## 2014-02-18 NOTE — Assessment & Plan Note (Signed)
Repeat renal artery Dopplers.   This is her annual check.

## 2014-02-18 NOTE — Patient Instructions (Signed)
In going to start a medicine called meclizine for dizziness. We'll also schedule carotid and renal Dopplers.  Follow up in 3 months or sooner if needed

## 2014-02-18 NOTE — Assessment & Plan Note (Signed)
His blood pressure is elevated. She has not been taking the Diovan on a consistent basis. I have sent a new prescription and she will start taking today.Marland Kitchen

## 2014-02-19 ENCOUNTER — Telehealth: Payer: Self-pay | Admitting: Physician Assistant

## 2014-02-19 DIAGNOSIS — R42 Dizziness and giddiness: Secondary | ICD-10-CM

## 2014-02-19 NOTE — Telephone Encounter (Signed)
Message deferred to [place], [place]

## 2014-02-19 NOTE — Telephone Encounter (Signed)
Pharmacist says she can not get the Meclizine in 49 mg,she can get the 25 mg>Is the 25 mg okay?

## 2014-02-20 ENCOUNTER — Telehealth: Payer: Self-pay | Admitting: Family Medicine

## 2014-02-20 MED ORDER — FLUCONAZOLE 100 MG PO TABS: 100.0000 mg | ORAL_TABLET | Freq: Every day | ORAL | Status: DC | PRN

## 2014-02-20 MED ORDER — MECLIZINE HCL 25 MG PO TABS: 25.0000 mg | ORAL_TABLET | Freq: Three times a day (TID) | ORAL | Status: DC | PRN

## 2014-02-20 NOTE — Telephone Encounter (Signed)
Prescription sent to [facility] in [place]. Please inform patient. Thanks.

## 2014-02-20 NOTE — Telephone Encounter (Signed)
LM for patient that rx was sent to pharmacy. Jazmin Hartsell,CMA  

## 2014-02-20 NOTE — Telephone Encounter (Signed)
Refill request for Diflucan. Pls send to the [facility] in [place], [place]

## 2014-02-20 NOTE — Telephone Encounter (Signed)
Yes 25 mg is ok.   Thanks  Wilburt Finlay PAC

## 2014-02-20 NOTE — Telephone Encounter (Signed)
Refilled med for 

## 2014-03-12 ENCOUNTER — Telehealth: Payer: Self-pay | Admitting: Cardiovascular Disease

## 2014-03-12 ENCOUNTER — Other Ambulatory Visit: Payer: Self-pay | Admitting: Family Medicine

## 2014-03-12 MED ORDER — FOLIC ACID 1 MG PO TABS: 1.0000 mg | ORAL_TABLET | Freq: Every day | ORAL | Status: DC

## 2014-03-12 NOTE — Telephone Encounter (Signed)
Pt called in stating that she is out of her Folic Acid 1mg and would like it called in to the [facility] in [place]([phone]). Please call ° °Thanks °

## 2014-03-12 NOTE — Telephone Encounter (Signed)
Folic Acid refilled electronically.

## 2014-03-12 NOTE — Telephone Encounter (Signed)
Pt called in stating that she is out of her Folic Acid  and would like it called in to the [facility] in [place]([phone]). Please call  Thanks

## 2014-03-16 ENCOUNTER — Telehealth: Payer: Self-pay | Admitting: Cardiovascular Disease

## 2014-03-16 NOTE — Telephone Encounter (Signed)
Wanted to know why arent they doing the carotid dopplers earlier. Please call   Thanks

## 2014-03-16 NOTE — Telephone Encounter (Signed)
Returned call to patient she stated she wanted to see Dr.Hochrein.Stated she has only seen Wilburt Finlay PA since Dr.Weintraub retired.Stated she knows JC who is Dr.Weintraubs former Engineer, civil (consulting) and wants to establish with Dr.Hochrein.Stated she has not had any testing since Dr.Weintraub retired.Appointment scheduled with Dr.Hochrein 04/07/14 at 4:30 pm.JC was told and will check with Dr.Hochrein to see who needs to follow her pacemaker.

## 2014-03-16 NOTE — Telephone Encounter (Signed)
Closed encounter °

## 2014-03-25 ENCOUNTER — Telehealth: Payer: Self-pay | Admitting: Family Medicine

## 2014-03-25 MED ORDER — HYDROXYZINE HCL 10 MG PO TABS: 10.0000 mg | ORAL_TABLET | Freq: Every day | ORAL | Status: DC

## 2014-03-25 NOTE — Telephone Encounter (Signed)
Please call the patient and find out why she takes this medication. Please also inform her that she needs to come in for a follow-up visit for this to be filled as it appears that it has not been filled in almost 2 years. Thanks.

## 2014-03-25 NOTE — Telephone Encounter (Signed)
Refill sent to pharmacy. Patient should make an appointment in November.

## 2014-03-25 NOTE — Telephone Encounter (Signed)
Refill request for [facility]OXYzine. [facility] [place] [place] [phone]

## 2014-03-25 NOTE — Telephone Encounter (Signed)
Pt called because she takes hydrOxyzine for her allergies and COPD. She has had refills and doesn't take it everyday. [place] is at the beach and will be back the first of November. Can we send in enough to last until she can get an appointment with you in November. jw

## 2014-03-25 NOTE — Telephone Encounter (Signed)
Left detailed message for patient to call back and schedule follow up appt.  Also need to know reason for refilling medication due to amount of time passed since her last one. Jazmin Hartsell,CMA

## 2014-03-26 ENCOUNTER — Encounter: Payer: Self-pay | Admitting: *Deleted

## 2014-03-26 ENCOUNTER — Other Ambulatory Visit: Payer: Self-pay | Admitting: *Deleted

## 2014-03-26 MED ORDER — HYDROXYZINE HCL 10 MG PO TABS: 10.0000 mg | ORAL_TABLET | Freq: Every day | ORAL | Status: DC

## 2014-03-26 NOTE — Telephone Encounter (Signed)
I have not received a PA form; will call number provided to get the form faxed to clinic.  Clovis PuMartin, Tamika L, RN

## 2014-03-26 NOTE — Telephone Encounter (Signed)
Will forward to RN to see if she has received PA for this medication.  Jazmin Hartsell,CMA

## 2014-03-26 NOTE — Telephone Encounter (Signed)
Needs over ride for her medication Over ride # [phone] Pharmacy cant fill it until they receive this number. The insurance needs prior authorization

## 2014-03-26 NOTE — Telephone Encounter (Signed)
Mailed letter. [name] Dawn  

## 2014-03-30 NOTE — Telephone Encounter (Signed)
[facility] Hydro[facility]ine 10 mg tablet. Formulary and PA form placed in provider box for completion. Clovis PuMartin, Tamika L, RN

## 2014-03-31 ENCOUNTER — Other Ambulatory Visit: Payer: Self-pay | Admitting: Family Medicine

## 2014-03-31 ENCOUNTER — Encounter (HOSPITAL_COMMUNITY): Payer: Medicare Other

## 2014-03-31 MED ORDER — COLESTIPOL HCL 5 G PO PACK: 5.0000 g | PACK | Freq: Four times a day (QID) | ORAL | Status: DC | PRN

## 2014-03-31 NOTE — Telephone Encounter (Signed)
Needs refill on colestaid--brand name not generic

## 2014-04-01 ENCOUNTER — Encounter: Payer: Self-pay | Admitting: *Deleted

## 2014-04-01 NOTE — Telephone Encounter (Signed)
PA form faxed to [employer] for review.  Megan PuMartin, Demond [place]en[place] L, RN

## 2014-04-03 NOTE — Telephone Encounter (Signed)
Received a called from pt's insurance requesting more information on the PA hydroxyzine.  Pt has not tried any of the other formulary.  Would like know if provider is willing to change to different formulary.  Form in provider box for review.  Clovis PuMartin, Shuree Brossart L, RN

## 2014-04-06 ENCOUNTER — Encounter: Payer: Self-pay | Admitting: *Deleted

## 2014-04-06 NOTE — Telephone Encounter (Signed)
PA for Hydroxyzine has been denied by pt's insurance.  Formulary alternative placed in provider box review.  Clovis PuMartin, Tamika L, RN

## 2014-04-06 NOTE — Telephone Encounter (Signed)
This encounter was created in error - please disregard.

## 2014-04-07 ENCOUNTER — Ambulatory Visit: Payer: Medicare Other | Admitting: Cardiology

## 2014-04-07 ENCOUNTER — Other Ambulatory Visit: Payer: Self-pay | Admitting: Family Medicine

## 2014-04-07 MED ORDER — DESLORATADINE 5 MG PO TABS: 5.0000 mg | ORAL_TABLET | Freq: Every day | ORAL | Status: DC

## 2014-04-17 ENCOUNTER — Ambulatory Visit: Payer: Medicare Other | Admitting: Family Medicine

## 2014-04-24 ENCOUNTER — Encounter (HOSPITAL_COMMUNITY): Payer: Self-pay | Admitting: *Deleted

## 2014-04-24 ENCOUNTER — Ambulatory Visit: Payer: Medicare Other | Admitting: Cardiovascular Disease

## 2014-05-01 MED ORDER — CETIRIZINE HCL 10 MG PO TABS: 10.0000 mg | ORAL_TABLET | Freq: Every day | ORAL | Status: AC

## 2014-05-01 NOTE — Telephone Encounter (Signed)
Pt called again today requesting an override for hydroxyzine.  PA was completed and denied since pt has not tried any other formulary.  Pt stated she has an ear ache and drainage x 3 weeks now.  Will forward to PCP.  Clovis PuMartin, Trust Crago L, RN

## 2014-05-01 NOTE — Telephone Encounter (Signed)
Prescription sent in for zyrtec for patient to try given that hydroxyzine was not previously approved.

## 2014-05-01 NOTE — Telephone Encounter (Signed)
Left voice message informing pt that Zyrtec was sent into her pharmacy since hydroxyzine was not covered.  Clovis PuMartin, Katiana Ruland L, RN

## 2014-05-01 NOTE — Addendum Note (Signed)
Addended by: Birdie SonsSONNENBERG, Shady Bradish G on: 05/01/2014 02:04 PM   Modules accepted: Orders

## 2014-05-04 ENCOUNTER — Ambulatory Visit: Payer: Medicare Other | Admitting: Family Medicine

## 2014-05-04 ENCOUNTER — Telehealth: Payer: Self-pay | Admitting: *Deleted

## 2014-05-04 NOTE — Telephone Encounter (Signed)
Pt called to request Hydroxyzine 25 mg tab.  Pt stated she broke out from taking desloratadine (CLARINEX) and the zyrtec is buys OTC.  Pt request that the provider complete a PA for the hydroxyzine 25 mg tab.  Will forward to PCP.  Clovis PuMartin, Tailor Westfall L, RN

## 2014-05-12 ENCOUNTER — Encounter (HOSPITAL_COMMUNITY): Payer: Medicare Other

## 2014-05-13 MED ORDER — HYDROXYZINE HCL 25 MG PO TABS: 25.0000 mg | ORAL_TABLET | Freq: Every day | ORAL | Status: DC

## 2014-05-13 NOTE — Telephone Encounter (Signed)
Prescription sent back in. Will fill out PA form when it comes in.

## 2014-05-13 NOTE — Addendum Note (Signed)
Addended by: Birdie SonsSONNENBERG, Aliscia Clayton G on: 05/13/2014 10:14 AM   Modules accepted: Orders

## 2014-05-14 ENCOUNTER — Other Ambulatory Visit: Payer: Self-pay | Admitting: Family Medicine

## 2014-05-15 ENCOUNTER — Ambulatory Visit: Payer: Medicare Other | Admitting: Cardiology

## 2014-05-20 ENCOUNTER — Other Ambulatory Visit: Payer: Self-pay | Admitting: Family Medicine

## 2014-05-20 MED ORDER — FLUCONAZOLE 100 MG PO TABS: 100.0000 mg | ORAL_TABLET | Freq: Every day | ORAL | Status: DC | PRN

## 2014-05-20 NOTE — Telephone Encounter (Signed)
Pt called and needs a refill on her Diflucan called in. jw °

## 2014-05-25 ENCOUNTER — Ambulatory Visit: Payer: Medicare Other | Admitting: Cardiovascular Disease

## 2014-06-11 ENCOUNTER — Telehealth: Payer: Self-pay | Admitting: Family Medicine

## 2014-06-11 NOTE — Telephone Encounter (Signed)
Pt called and needs a refill on Nystatin. She said that this works good for her. jw

## 2014-06-11 NOTE — Telephone Encounter (Signed)
I do not see this medication on her current medication list and I don't see a problem on her problem list that necessitates the need for this. It does not appear that she has gotten that from our clinic since 2013. She will need to be seen in clinic for refill on this. Please inform the patient.

## 2014-06-12 NOTE — Telephone Encounter (Signed)
LMOVM for pt to return call .Milledge Gerding Dawn  

## 2014-06-16 ENCOUNTER — Ambulatory Visit (HOSPITAL_COMMUNITY)
Admission: RE | Admit: 2014-06-16 | Discharge: 2014-06-16 | Disposition: A | Discharge status: Home / Self Care | Payer: Medicare Other | Source: Ambulatory Visit | Attending: Physician Assistant | Admitting: Physician Assistant

## 2014-06-16 ENCOUNTER — Ambulatory Visit (HOSPITAL_BASED_OUTPATIENT_CLINIC_OR_DEPARTMENT_OTHER)
Admission: RE | Admit: 2014-06-16 | Discharge: 2014-06-16 | Disposition: A | Discharge status: Home / Self Care | Payer: Medicare Other | Source: Ambulatory Visit | Attending: Physician Assistant | Admitting: Physician Assistant

## 2014-06-16 DIAGNOSIS — R701 Abnormal plasma viscosity: Secondary | ICD-10-CM

## 2014-06-16 DIAGNOSIS — I6522 Occlusion and stenosis of left carotid artery: Secondary | ICD-10-CM | POA: Insufficient documentation

## 2014-06-16 DIAGNOSIS — R42 Dizziness and giddiness: Secondary | ICD-10-CM | POA: Diagnosis not present

## 2014-06-16 DIAGNOSIS — I6529 Occlusion and stenosis of unspecified carotid artery: Secondary | ICD-10-CM

## 2014-06-16 DIAGNOSIS — I701 Atherosclerosis of renal artery: Secondary | ICD-10-CM

## 2014-06-16 NOTE — Progress Notes (Signed)
Renal Duplex Completed. Nyeemah Jennette, BS, RDMS, RVT  

## 2014-06-16 NOTE — Progress Notes (Signed)
Carotid Duplex Completed. [name], BS, RDMS, RVT  

## 2014-06-17 ENCOUNTER — Ambulatory Visit: Payer: Medicaid Other | Admitting: Cardiology

## 2014-06-22 ENCOUNTER — Other Ambulatory Visit: Payer: Self-pay | Admitting: *Deleted

## 2014-06-22 MED ORDER — SINGULAIR 10 MG PO TABS: 10.0000 mg | ORAL_TABLET | Freq: Every day | ORAL | Status: DC

## 2014-06-22 NOTE — Telephone Encounter (Signed)
Refill given for 2 months. Please inform the patient she should follow-up in the next 1-2 months. Thanks.

## 2014-06-23 NOTE — Telephone Encounter (Signed)
Pt is aware of this and appt made. [name],CMA

## 2014-06-29 ENCOUNTER — Telehealth: Payer: Self-pay | Admitting: Family Medicine

## 2014-06-29 NOTE — Telephone Encounter (Signed)
Patient should be advised that she needs to be seen in the office prior to a referral being placed. This lesion would need to be looked at to make sure that dermatology is the correct referral. Please inform her of this. Thanks.

## 2014-06-29 NOTE — Telephone Encounter (Signed)
Pt has an appt on 07/09/14 and is aware that this will be discussed then. Jazmin Hartsell,CMA

## 2014-06-29 NOTE — Telephone Encounter (Signed)
Pt called would like a referral to see Dr. Terri Piedra at [facility]. She has a growth on her neck that she needs to be looked at and possible removal. jw

## 2014-06-29 NOTE — Telephone Encounter (Signed)
Will send message to MD. Jazmin Hartsell,CMA  

## 2014-07-09 ENCOUNTER — Ambulatory Visit: Payer: Medicaid Other | Admitting: Family Medicine

## 2014-07-20 ENCOUNTER — Telehealth: Payer: Self-pay | Admitting: Family Medicine

## 2014-07-20 ENCOUNTER — Ambulatory Visit: Payer: Medicaid Other | Admitting: Family Medicine

## 2014-07-20 MED ORDER — MOBIC 15 MG PO TABS: 15.0000 mg | ORAL_TABLET | Freq: Every day | ORAL | Status: DC

## 2014-07-20 NOTE — Telephone Encounter (Signed)
Refill sent in for patient. Please inform her of this.

## 2014-07-20 NOTE — Telephone Encounter (Signed)
Pt came today at 8:30 am to be seen but we didn't open until 10:30 am. She has rescheduled and would like a refill on her Mobic called in. jw

## 2014-07-28 ENCOUNTER — Ambulatory Visit: Payer: Medicaid Other | Admitting: Cardiology

## 2014-08-17 ENCOUNTER — Other Ambulatory Visit: Payer: Self-pay | Admitting: Family Medicine

## 2014-08-17 ENCOUNTER — Other Ambulatory Visit: Payer: Self-pay | Admitting: Physician Assistant

## 2014-08-17 ENCOUNTER — Other Ambulatory Visit: Payer: Self-pay | Admitting: Cardiovascular Disease

## 2014-08-17 NOTE — Telephone Encounter (Signed)
Rx(s) sent to pharmacy electronically.  

## 2014-08-18 ENCOUNTER — Ambulatory Visit: Payer: Medicaid Other | Admitting: Family Medicine

## 2014-08-20 ENCOUNTER — Other Ambulatory Visit: Payer: Self-pay | Admitting: Family Medicine

## 2014-08-24 ENCOUNTER — Ambulatory Visit: Payer: Medicaid Other | Admitting: Cardiology

## 2014-08-25 ENCOUNTER — Encounter: Payer: Self-pay | Admitting: *Deleted

## 2014-08-25 NOTE — Progress Notes (Signed)
[facility] 15 mg tablet.  PA form placed in provider box for completion. Clovis PuMartin, Khadar Monger L, RN

## 2014-08-28 ENCOUNTER — Ambulatory Visit (INDEPENDENT_AMBULATORY_CARE_PROVIDER_SITE_OTHER): Payer: Medicare Other | Admitting: Cardiology

## 2014-08-28 ENCOUNTER — Encounter: Payer: Self-pay | Admitting: Cardiology

## 2014-08-28 VITALS — BP 136/68 | HR 60 | Ht 61.0 in | Wt 146.3 lb

## 2014-08-28 DIAGNOSIS — R197 Diarrhea, unspecified: Secondary | ICD-10-CM | POA: Diagnosis not present

## 2014-08-28 DIAGNOSIS — R0789 Other chest pain: Secondary | ICD-10-CM

## 2014-08-28 NOTE — Progress Notes (Signed)
HPI The patient is new to me. She has been seen in this practice recently however. She was previously seen by Dr. Alanda Amass and then for a while by Dr. Jacinto Halim.  She has a history of renal artery stenosis and nonobstructive coronary disease.  She's had stenting of her subclavian and renal artery. Some of this was quite confusing stent quite a bit of time sorting through as there was an error in the dictation. It mentioned femoral artery stenting but she's not had this. She does report that she's been having pain in her neck when she does something like climbing stairs. This is reproducible. The left side. 8 out of 10. There is some shortness of breath. Slowly getting worse over 6 months.  She's not otherwise having associated symptoms as nausea vomiting or diaphoresis. She's not getting the symptoms at rest.    Allergies  Allergen Reactions  . Codeine Anaphylaxis  . Losartan Shortness Of Breath    Throat swells up, choking  . Budesonide-Formoterol Fumarate Rash  . Ciprofloxacin Hives    Patient has to have NAME BRAND ONLY  . Clarinex [Desloratadine] Rash  . Neomycin Other (See Comments)    sores  . Tramadol Other (See Comments)    Has used in past but her throat closed up once.   . Hydrochlorothiazide Swelling  . Meloxicam Other (See Comments)    REACTION: Diarrehea with generic.  No problems with brand per pt.  . Rosuvastatin     REACTION: Pt reports she had a bad reaction, unsure what it was.  . Sulfonamide Derivatives Other (See Comments)    Reaction=choking  . Nitrofurantoin Monohyd Macro Diarrhea    Current Outpatient Prescriptions  Medication Sig Dispense Refill  . aspirin 81 MG tablet Take 81 mg by mouth daily.    Marland Kitchen BYSTOLIC 5 MG tablet TAKE 1 TABLET BY MOUTH DAILY 30 tablet 0  . cetirizine (ZYRTEC ALLERGY) 10 MG tablet Take 1 tablet (10 mg total) by mouth daily. 30 tablet 3  . clotrimazole-betamethasone (LOTRISONE) cream     . colestipol (COLESTID) 5 G packet Take 5 g by  mouth 4 (four) times daily as needed (diarrhea). 30 each 12  . DIOVAN 80 MG tablet TAKE 1 TABLET BY MOUTH DAILY 30 tablet 6  . diphenoxylate-atropine (LOMOTIL) 2.5-0.025 MG per tablet Take 1 tablet by mouth 4 (four) times daily as needed for diarrhea or loose stools. 30 tablet 0  . fluconazole (DIFLUCAN) 100 MG tablet TAKE 1 TABLET BY MOUTH DAILY AS NEEDED 30 tablet 0  . folic acid (FOLVITE) 1 MG tablet Take 1 tablet (1 mg total) by mouth daily. 30 tablet 6  . iron polysaccharides (NU-IRON) 150 MG capsule Take 150 mg by mouth daily.    Marland Kitchen KEFLEX 500 MG capsule Take 2 tablets by mouth daily.  0  . lactobacillus acidophilus & bulgar (LACTINEX) chewable tablet Chew 1 tablet by mouth 3 (three) times daily with meals. 90 tablet 0  . meclizine (ANTIVERT) 25 MG tablet Take 1 tablet (25 mg total) by mouth 3 (three) times daily as needed. 60 tablet 3  . MOBIC 15 MG tablet Take 1 tablet (15 mg total) by mouth daily. 30 tablet 3  . Multiple Vitamins-Minerals (CENTRUM SILVER ULTRA WOMENS PO) Take 1 tablet by mouth every morning.    Marland Kitchen NEXIUM 40 MG capsule TAKE ONE CAPSULE BY MOUTH EVERY DAY 90 capsule 2  . nitroGLYCERIN (NITROSTAT) 0.4 MG SL tablet Place 1 tablet (0.4 mg total) under  the tongue every 5 (five) minutes as needed for chest pain. No more than 3 tabs in 15 mins. 25 tablet 1  . phenazopyridine (PYRIDIUM) 200 MG tablet TAKE 1 TABLET BY MOUTH THREE TIMES DAILY AS NEEDED FOR PAIN 270 tablet 1  . PROAIR HFA 108 (90 BASE) MCG/ACT inhaler Inhale 1 puff into the lungs daily as needed.  1  . SINGULAIR 10 MG tablet TAKE 1 TABLET BY MOUTH EVERY NIGHT AT BEDTIME 30 tablet 3  . traMADol (ULTRAM) 50 MG tablet TAKE 1 TABLET BY MOUTH TWICE DAILY AS NEEDED FOR PAIN 60 tablet 1  . zolpidem (AMBIEN CR) 12.5 MG CR tablet Take 1 tablet (12.5 mg total) by mouth at bedtime as needed for sleep. 15 tablet 3   No current facility-administered medications for this visit.    Past Medical History  Diagnosis Date  .  Hypertension   . Arthritis   . Asthma   . CAD (coronary artery disease)     nonobstructive. 2010 40. Sent RCA, 40% circumflex, 40-50% LAD. Negative Myoview 2013.  Marland Kitchen. PVD (peripheral vascular disease)     left subclavian stent 2012, right renal stent 2012, subclavian stent patent on Doppler 2015    Past Surgical History  Procedure Laterality Date  . Abdominal hysterectomy      for concern for CA  . Abdominal surgery    . Cholecystectomy    . Appendectomy    . Ureteral stent placement      ROS:  Cramping in legs.  Otherwise as stated in the HPI and negative for all other systems.  PHYSICAL EXAM BP 136/68 mmHg  Pulse 60  Ht 5\' 1"  (1.549 m)  Wt 146 lb 4.8 oz (66.361 kg)  BMI 27.66 kg/m2 GENERAL:  Well appearing HEENT:  Pupils equal round and reactive, fundi not visualized, oral mucosa unremarkable NECK:  No jugular venous distention, waveform within normal limits, carotid upstroke brisk and symmetric, no bruits, no thyromegaly LYMPHATICS:  No cervical, inguinal adenopathy LUNGS:  Clear to auscultation bilaterally BACK:  No CVA tenderness CHEST:  Unremarkable HEART:  PMI not displaced or sustained,S1 and S2 within normal limits, no S3, no S4, no clicks, no rubs, no murmurs ABD:  Flat, positive bowel sounds normal in frequency in pitch, no bruits, no rebound, no guarding, no midline pulsatile mass, no hepatomegaly, no splenomegaly EXT:  2 plus pulses throughout, no edema, no cyanosis no clubbing SKIN:  No rashes no nodules NEURO:  Cranial nerves II through XII grossly intact, motor grossly intact throughout PSYCH:  Cognitively intact, oriented to person place and time  EKG:  Sinus rhythm, rate 60, axis within normal limits, intervals within normal limits, no acute ST-T wave changes.  08/28/2014   ASSESSMENT AND PLAN  NECK PAIN:  She did have nonobstructive coronary disease. Given this and her ongoing symptoms stress testing is indicated. She wouldn't be a walk on treadmill.  Therefore, she will have a [employer] Myoview.  PVD:  I extensively reviewed her records. Of note in her last catheterization by Dr. Jacinto HalimGanji it mentions right femoral artery stenting. However, this is apparently right renal artery stenting. Subclavian and renal arteries are patent. She will continue with risk reduction.  HTN:  The blood pressure is at target. No change in medications is indicated. We will continue with therapeutic lifestyle changes (TLC).

## 2014-08-28 NOTE — Patient Instructions (Signed)
Your physician recommends that you schedule a follow-up appointment in:  6 months with Dr. Antoine PocheHochrein  We are setting you up for a stress test  We will put in a referral for Dr. Ewing SchleinMagod

## 2014-08-31 ENCOUNTER — Telehealth (HOSPITAL_COMMUNITY): Payer: Self-pay | Admitting: *Deleted

## 2014-09-03 NOTE — Progress Notes (Signed)
Completed and placed in [name]'s box. 

## 2014-09-04 NOTE — Progress Notes (Signed)
PA has been dismissed due an exception has already been granted.  [facility] is aware.  Clovis PuMartin, Tamika L, RN

## 2014-09-04 NOTE — Progress Notes (Signed)
PA form faxed to [facility] for review.  Martin, Tamika L, RN  

## 2014-09-17 ENCOUNTER — Other Ambulatory Visit: Payer: Self-pay | Admitting: Family Medicine

## 2014-09-17 NOTE — Telephone Encounter (Signed)
Refilled for one month. Patient has not been seen in our office in one year. Should be advised to schedule follow-up in the next 1-2 months.

## 2014-09-21 NOTE — Telephone Encounter (Signed)
Pt has an appt on 10/05/2014. Chandy Tarman,CMA

## 2014-10-01 ENCOUNTER — Telehealth (HOSPITAL_COMMUNITY): Payer: Self-pay

## 2014-10-01 NOTE — Telephone Encounter (Signed)
Encounter complete. 

## 2014-10-02 ENCOUNTER — Telehealth: Payer: Self-pay | Admitting: Cardiology

## 2014-10-02 NOTE — Telephone Encounter (Signed)
Patient notified she can take medications as directed prior to stress test. She voiced understanding

## 2014-10-02 NOTE — Telephone Encounter (Signed)
Pt called in stating that she has a stress test on 4/13 and she would like to know which medications she can and can not take. Please call  Thanks

## 2014-10-05 ENCOUNTER — Ambulatory Visit: Payer: Medicare Other | Admitting: Family Medicine

## 2014-10-07 ENCOUNTER — Ambulatory Visit (HOSPITAL_COMMUNITY)
Admission: RE | Admit: 2014-10-07 | Discharge: 2014-10-07 | Disposition: A | Discharge status: Home / Self Care | Payer: Medicare Other | Source: Ambulatory Visit | Attending: Cardiology | Admitting: Cardiology

## 2014-10-07 DIAGNOSIS — R0609 Other forms of dyspnea: Secondary | ICD-10-CM | POA: Diagnosis not present

## 2014-10-07 DIAGNOSIS — I2510 Atherosclerotic heart disease of native coronary artery without angina pectoris: Secondary | ICD-10-CM | POA: Diagnosis not present

## 2014-10-07 DIAGNOSIS — R0789 Other chest pain: Secondary | ICD-10-CM | POA: Insufficient documentation

## 2014-10-07 DIAGNOSIS — R42 Dizziness and giddiness: Secondary | ICD-10-CM | POA: Insufficient documentation

## 2014-10-07 DIAGNOSIS — I10 Essential (primary) hypertension: Secondary | ICD-10-CM | POA: Diagnosis not present

## 2014-10-07 DIAGNOSIS — R5383 Other fatigue: Secondary | ICD-10-CM | POA: Insufficient documentation

## 2014-10-07 DIAGNOSIS — Z87891 Personal history of nicotine dependence: Secondary | ICD-10-CM | POA: Insufficient documentation

## 2014-10-07 DIAGNOSIS — J449 Chronic obstructive pulmonary disease, unspecified: Secondary | ICD-10-CM | POA: Insufficient documentation

## 2014-10-07 DIAGNOSIS — J45909 Unspecified asthma, uncomplicated: Secondary | ICD-10-CM | POA: Insufficient documentation

## 2014-10-07 DIAGNOSIS — Z8249 Family history of ischemic heart disease and other diseases of the circulatory system: Secondary | ICD-10-CM | POA: Insufficient documentation

## 2014-10-07 MED ORDER — AMINOPHYLLINE 25 MG/ML IV SOLN
75.0000 mg | Freq: Once | INTRAVENOUS | Status: AC
  Administered 2014-10-07: 75 mg via INTRAVENOUS

## 2014-10-07 MED ORDER — TECHNETIUM TC 99M SESTAMIBI GENERIC - CARDIOLITE
32.3000 | Freq: Once | INTRAVENOUS | Status: AC | PRN
  Administered 2014-10-07: 32.3 via INTRAVENOUS

## 2014-10-07 MED ORDER — TECHNETIUM TC 99M SESTAMIBI GENERIC - CARDIOLITE
10.3000 | Freq: Once | INTRAVENOUS | Status: AC | PRN
  Administered 2014-10-07: 10 via INTRAVENOUS

## 2014-10-07 MED ORDER — REGADENOSON 0.4 MG/5ML IV SOLN
0.4000 mg | Freq: Once | INTRAVENOUS | Status: AC
  Administered 2014-10-07: 0.4 mg via INTRAVENOUS

## 2014-10-07 MED ORDER — REGADENOSON 0.4 MG/5ML IV SOLN: 0.4000 mg | Freq: Once | INTRAVENOUS | Status: DC

## 2014-10-07 NOTE — Procedures (Addendum)
[facility] [address] [place] [id number] [phone]  Cardiology Nuclear Med Study  Fransisca ConnorsBetty J [name] is a 74 y.o. female     MRN : [id number]     DOB: Jun 30, 1940  Procedure Date: 10/07/2014  Nuclear Med Background Indication for Stress Test:  Evaluation for Ischemia History:  Asthma, COPD and CAD;Non obstructive coronary disease;CATH 09/17/2008;Last NUC MPI on 03/21/2012-normal; Cardiac Risk Factors: Carotid Disease, Family History - CAD, History of Smoking, Hypertension, Lipids and PVD  Symptoms:  Chest Pain, Dizziness, DOE, Fatigue, Light-Headedness, Palpitations and SOB   Nuclear Pre-Procedure Caffeine/Decaff Intake:  9:30pm NPO After: 5:30am   IV Site: R Forearm  IV 0.9% NS with Angio Cath:  22g  Chest Size (in):  n/a IV Started by: Berdie OgrenAmanda Wease, RN  Height: 5\' 1"  (1.549 m)  Cup Size: D  BMI:  Body mass index is 27.6 kg/(m^2). Weight:  146 lb (66.225 kg)   Tech Comments:  n/a    Nuclear Med Study 1 or 2 day study: 1 day  Stress Test Type:  Lexiscan  Order Authorizing Provider:  Rollene RotundaJames Hochrein, MD   Resting Radionuclide: Technetium 3979m Sestamibi  Resting Radionuclide Dose: 10.3 mCi   Stress Radionuclide:  Technetium 4879m Sestamibi  Stress Radionuclide Dose: 32.3 mCi           Stress Protocol Rest HR: 57 Stress HR: 78  Rest BP: 129/85 Stress BP: 165/74  Exercise Time (min): n/a METS: n/a          Dose of Adenosine (mg):  n/a Dose of Lexiscan: 0.4 mg  Dose of Atropine (mg): n/a Dose of Dobutamine: n/a mcg/kg/min (at max HR)  Stress Test Technologist: Ernestene MentionGwen Farrington, CCT Nuclear Technologist: Gonzella LexPam Phillips, CNMT   Rest Procedure:  Myocardial perfusion imaging was performed at rest 45 minutes following the intravenous administration of Technetium 3879m Sestamibi. Stress Procedure:  The patient received IV Lexiscan 0.4 mg over 15-seconds.  Technetium 2779m Sestamibi injected IV at 30-seconds.  Patient experienced  shortness of breath, chest pressure, throat tightness and dizziness. She was administered 75 mg of Aminophylline IV at 5 minutes. There were no significant changes with Lexiscan.  Quantitative spect images were obtained after a 45 minute delay.  Transient Ischemic Dilatation (Normal <1.22):  1.18  QGS EDV:  76 ml QGS ESV:  23 ml LV Ejection Fraction: 70%  Rest ECG: NSR - Normal EKG  Stress ECG: No significant change from baseline ECG  QPS Raw Data Images:  Normal; no motion artifact; normal heart/lung ratio. Stress Images:  Small inferoapical perfusion defet Rest Images:  small inferoapical perfusion defect Subtraction (SDS):  No evidence of ischemia.  Impression Exercise Capacity:  Lexiscan with no exercise. BP Response:  Normal blood pressure response. Clinical Symptoms:  chest pressure, tightness ECG Impression:  No significant ECG changes with Lexiscan. Comparison with Prior Nuclear Study: No images to compare  Overall Impression:  Low risk stress nuclear study with small, mild intensity fixed inferoapical attenuation artifact. No reversible ischemia.  LV Wall Motion:  NL LV Function; NL Wall Motion; EF 70%  Chrystie NoseKenneth C. Meegan Shanafelt, MD, [place] CenterFACC Board Certified in Nuclear Cardiology Attending Cardiologist [facility]   Chrystie NoseHILTY,Aaidyn San C, MD  10/07/2014 1:08 PM

## 2014-10-21 ENCOUNTER — Other Ambulatory Visit: Payer: Self-pay | Admitting: Family Medicine

## 2014-10-21 MED ORDER — FLUCONAZOLE 100 MG PO TABS
100.0000 mg | ORAL_TABLET | Freq: Every day | ORAL | Status: DC | PRN
Start: 2014-10-21 — End: 2014-11-30

## 2014-10-21 NOTE — Telephone Encounter (Signed)
Refill given. Needs follow-up appointment as has not been seen in over a year in our clinic. Please inform the patient. Thanks.

## 2014-10-21 NOTE — Telephone Encounter (Signed)
Needs refill on diflucan [facility] in [place] [phone]

## 2014-11-03 ENCOUNTER — Telehealth: Payer: Self-pay | Admitting: Family Medicine

## 2014-11-03 NOTE — Telephone Encounter (Signed)
If patient is unable to come to the visit tomorrow she should be advised that [place] should [place] to be evaluated for this issue if she is too sick to come to our clinic for evaluation.

## 2014-11-03 NOTE — Telephone Encounter (Signed)
Spoke with patient and she states that she is having a lot of inflammation.  Her arthritis is acting up and her bowels are also inflammed.  She would like to get a referral to see Dr. Ewing SchleinMagod for this issue.  She needs a new referral for this. She states that she is too sick to keep her appt tomorrow and [place].  Patient would like to see Dr. Ewing SchleinMagod first.  I informed her that she would likely need to keep her appt tomorrow or plan to be seen soon before this referral can be completed but she insisted that I ask PCP. Edrick Whitehorn,CMA

## 2014-11-03 NOTE — Telephone Encounter (Signed)
Asking to speak with RN re: her appt here tomorrow, did not want to give further details

## 2014-11-04 ENCOUNTER — Ambulatory Visit: Payer: Medicare Other | Admitting: Family Medicine

## 2014-11-04 NOTE — Telephone Encounter (Signed)
LM for patient ok per ROI.  Teddy Rebstock,CMA  

## 2014-11-30 ENCOUNTER — Other Ambulatory Visit: Payer: Self-pay | Admitting: Family Medicine

## 2014-11-30 MED ORDER — FLUCONAZOLE 100 MG PO TABS: 100.0000 mg | ORAL_TABLET | Freq: Every day | ORAL | Status: DC | PRN

## 2014-11-30 NOTE — Telephone Encounter (Signed)
Mrs. Diltz needs a refShea Evansill on her Diflucan. Please follow up with her once it is complete, thank you, Dorothey BasemanSadie Reynolds, ASA

## 2014-11-30 NOTE — Telephone Encounter (Signed)
Will refill for one month. Patient has not been seen in the office in over a year. She needs an appointment in the next month to receive further refills. Please inform the patient. Thanks.

## 2014-12-08 ENCOUNTER — Telehealth: Payer: Self-pay | Admitting: Cardiology

## 2014-12-08 NOTE — Telephone Encounter (Signed)
Pt called in wanting to speak with the nurse about her having low sodium and fluid in both legs. She also said that her blood count in 9.3 and she would like to be seen by the doctor . Please call  Thanks

## 2014-12-08 NOTE — Telephone Encounter (Signed)
Called pt. And , she will see Dr. Antoine Poche next Thursday about her low Na. And swelling in her legs

## 2014-12-09 ENCOUNTER — Ambulatory Visit: Payer: Medicare Other | Admitting: Family Medicine

## 2014-12-15 ENCOUNTER — Telehealth: Payer: Self-pay | Admitting: *Deleted

## 2014-12-15 NOTE — Telephone Encounter (Signed)
called for fm hx/status, unable to reach pt... 

## 2014-12-17 ENCOUNTER — Ambulatory Visit: Payer: Medicare Other | Admitting: Cardiology

## 2015-01-25 DIAGNOSIS — Z9289 Personal history of other medical treatment: Secondary | ICD-10-CM

## 2015-01-25 HISTORY — DX: Personal history of other medical treatment: Z92.89

## 2015-02-18 ENCOUNTER — Encounter: Payer: Self-pay | Admitting: Cardiovascular Disease

## 2015-03-05 ENCOUNTER — Other Ambulatory Visit: Payer: Self-pay | Admitting: Physician Assistant

## 2015-06-02 ENCOUNTER — Other Ambulatory Visit: Payer: Self-pay | Admitting: Physician Assistant

## 2015-06-02 NOTE — Telephone Encounter (Signed)
REFILL 

## 2015-06-11 ENCOUNTER — Telehealth: Payer: Self-pay | Admitting: Cardiology

## 2015-06-11 MED ORDER — NITROGLYCERIN 0.4 MG SL SUBL: 0.4000 mg | SUBLINGUAL_TABLET | SUBLINGUAL | Status: DC | PRN

## 2015-06-11 NOTE — Telephone Encounter (Signed)
OK to refill tramadol 

## 2015-06-11 NOTE — Telephone Encounter (Signed)
Med refilled via pharmacy phone-in.  Called, got pt VM. Left pt message informing of this.

## 2015-06-11 NOTE — Telephone Encounter (Signed)
°  New Prob    *STAT* If patient is at the pharmacy, call can be transferred to refill team.   1. Which medications need to be refilled? (please list name of each medication and dose if known)  Tramadol 50 mg Nitroglycerin   2. Which pharmacy/location (including street and city if local pharmacy) is medication to be sent to? [facility] on [address]  3. Do they need a 30 day or 90 day supply? 90 days

## 2015-06-11 NOTE — Telephone Encounter (Signed)
Pt having some issues, is scheduled for carotids and renals, having some SOB which she notes probably COPD related but wanted to see if echo warranted. She is willing to wait for advice and have OV w/ [name]. Scheduled w/ [name] same day as vascular studies. She understands we may not have results from these studies same day.  Refilled nitro as requested. Advised to f/u w/ PCP about tramadol. Any new questions/concerns, instructed to call.

## 2015-06-11 NOTE — Telephone Encounter (Signed)
Follow Up  Pt called req an order for an ECHO. Please assist

## 2015-06-11 NOTE — Telephone Encounter (Signed)
Refill request: Tramadol  Pt called again to request a refill. She had called earlier & I refilled her nitrostat. She is requesting authorization for refill on her tramadol.  States only occasional use. Takes 1/2 a tab as needed for back/MSK pain. Had been originally prescribed by Dr. Alanda AmassWeintraub.  Explained to patient I would need to obtain OK from Dr. Antoine PocheHochrein and he may defer to her PCP on this.  Also set patient up for extender visit next week at her request.

## 2015-06-14 ENCOUNTER — Encounter: Payer: Self-pay | Admitting: Physician Assistant

## 2015-06-14 NOTE — Progress Notes (Signed)
Cardiology Office Note   Date:  06/15/2015   ID:  [name], DOB 1940-11-17, MRN [id number]  PCP:  Jacquiline Doe, MD  Cardiologist:  Dr Luna Kitchens, PA-C   Chief complaint: Chest pain  History of Present Illness: [name] is a 74 y.o. female with a history of cath 2010, LAD 40-50%, CFX 40%, RCA 40-50% w/ catheter spasm noted, EF > 60%, subclavian stent, right renal stent and hypertension  [name] presents for evaluation of left-sided chest pain.  She has had some episodes of left-sided chest pain with exertion that radiates up into her neck. She has chest wall tenderness and it hurts worse with deep inspiration. She also has dyspnea on exertion and states doing steps is hard for her.  She also has pain on the left lower ribs that is sharp and feels like electricity. She had a fall several weeks ago when her dog was very sick where she landed in sort of a twisted fashion. Since then, her dog has died. She is doing pretty well with this.  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 She has not had problems with working, but only when she does extra exertion. She has not had any resting symptoms when he comes to her upper left chest pain. Sometimes the pain in her lower ribs will come on without any reason.      Past Medical History  Diagnosis Date  . Hypertension   . Arthritis   . Asthma   . CAD (coronary artery disease) 08/2008    LAD 40-50%, CFX 40%, RCA 40-50% w/ catheter spasm, EF > 60%  . PVD (peripheral vascular disease) (HCC)     left subclavian stent & right renal stent 2010, subclavian stent patent on Doppler 2015  . History of nuclear stress test 01/2015    No scar or ischemia, EF > 80%    Past Surgical History  Procedure Laterality Date  . Abdominal hysterectomy      for concern for CA  . Abdominal surgery    . Cholecystectomy    . Appendectomy    . Ureteral stent placement    . Doppler echocardiography  03/21/2012    EF  >55%, mild concentric LVH, LV systolic function is normal, there is aortic root sclerosis/calcification  . Cardiovascular stress test  03/21/2012    normal pattern of perfusion in all regions, no scintigraphic evidence of inducible myocardial ischemia, no EKG changes for ischemia.  . Cardiac catheterization  09/17/2008    mild noncritical coronary artery disease-recommend medical therapy, high-grade left subclavian stenosis-recommend left subclavian percutaneous peripheral intervention (PPI). recommend doppler surveillance of renal arteries for mild left and moderate right renal artery stenosis  . Carotid duplex  03/13/2013    bilateral ICAs demonstrated normal patency without evidence of a significant diameter reduction, moderate tortuosity noted throughout the left ICA with falsley elevated velocities in the mid sigment. left subclavian arterial stent demonstrated normal velocities without suggestion of a significant diameter reduction  . Subclavian vein angioplasty / stenting Left 09/28/2008    L Subclavian 70-80% stenosis-10.0x45mm self-expanding stent post dilated with a 7.0x46mm [place] balloon. stenosis reduced from 70-80% to 0% residual. Stenting of R femoral artery 70% narrowing-5.0x12 Herculink stent deployed at 10atm and postdilated at 14atm resulting in reduction from 70% stenosis to 0% residual    Current Outpatient Prescriptions  Medication Sig Dispense Refill  . aspirin 81 MG tablet Take 81 mg by mouth  daily.    . cetirizine (ZYRTEC ALLERGY) 10 MG tablet Take 1 tablet (10 mg total) by mouth daily. 30 tablet 3  . clotrimazole-betamethasone (LOTRISONE) cream     . colestipol (COLESTID) 5 G packet Take 5 g by mouth 4 (four) times daily as needed (diarrhea). 30 each 12  . DIOVAN 80 MG tablet TAKE 1 TABLET BY MOUTH DAILY 30 tablet 6  . diphenoxylate-atropine (LOMOTIL) 2.5-0.025 MG per tablet Take 1 tablet by mouth 4 (four) times daily as needed for diarrhea or loose stools. 30 tablet 0  .  folic acid (FOLVITE) 1 MG tablet Take 1 tablet (1 mg total) by mouth daily. NEED OV. 90 tablet 0  . iron polysaccharides (NU-IRON) 150 MG capsule Take 150 mg by mouth daily.    . meclizine (ANTIVERT) 25 MG tablet Take 1 tablet (25 mg total) by mouth 3 (three) times daily as needed. 60 tablet 3  . MOBIC 15 MG tablet Take 1 tablet (15 mg total) by mouth daily. 30 tablet 3  . Multiple Vitamins-Minerals (CENTRUM SILVER ULTRA WOMENS PO) Take 1 tablet by mouth every morning.    . nebivolol (BYSTOLIC) 10 MG tablet Take 10 mg by mouth daily.    Marland Kitchen NEXIUM 40 MG capsule TAKE ONE CAPSULE BY MOUTH EVERY DAY 90 capsule 2  . nitroGLYCERIN (NITROSTAT) 0.4 MG SL tablet PLACE 1 TABLET(0.4MG ) UNDER THE TONGUE EVERY 5 MINUTES AS NEEDED FOR CHEST PAIN 25 tablet 1  . phenazopyridine (PYRIDIUM) 200 MG tablet TAKE 1 TABLET BY MOUTH THREE TIMES DAILY AS NEEDED FOR PAIN 270 tablet 1  . PROAIR HFA 108 (90 BASE) MCG/ACT inhaler Inhale 1 puff into the lungs daily as needed.  1  . SINGULAIR 10 MG tablet TAKE 1 TABLET BY MOUTH EVERY NIGHT AT BEDTIME 30 tablet 3  . traMADol (ULTRAM) 50 MG tablet TAKE 1 TABLET BY MOUTH TWICE DAILY AS NEEDED FOR PAIN 60 tablet 1  . zolpidem (AMBIEN CR) 12.5 MG CR tablet Take 1 tablet (12.5 mg total) by mouth at bedtime as needed for sleep. 15 tablet 3   No current facility-administered medications for this visit.    Allergies:   Codeine; Losartan; Budesonide-formoterol fumarate; Ciprofloxacin; Clarinex; Neomycin; Polyethylene glycol; Tramadol; Hydrochlorothiazide; Meloxicam; Rosuvastatin; Sulfonamide derivatives; Nitrofurantoin; and Nitrofurantoin monohyd macro    Social History:  The patient  reports that she has quit smoking. She has never used smokeless tobacco. She reports that she does not drink alcohol.   Family History:  The patient's family history includes Cancer in her maternal grandmother; Heart attack in her maternal grandmother and mother.    ROS:  Please see the history of  present illness. All other systems are reviewed and negative.    PHYSICAL EXAM: VS:  BP 162/82 mmHg  Pulse 96  Ht  (1.549 m)  Wt 151 lb 12.8 oz (68.856 kg)  BMI 28.70 kg/m2 , BMI Body mass index is 28.7 kg/(m^2). GEN: Well nourished, well developed, female in no acute distress HEENT: normal for age  Neck: no JVD, faint bilateral carotid bruits, no masses; left subclavian bruit noted Cardiac: RRR; 2/6 murmur, no rubs, or gallops; upper left chest wall and lower left ribs are tender to palpation Respiratory:  clear to auscultation bilaterally, normal work of breathing GI: soft, nontender, nondistended, + BS MS: no deformity or atrophy; no edema; distal pulses are 2+ in all 4 extremities  Skin: warm and dry, no rash Neuro:  Strength and sensation are intact Psych: euthymic mood, full  affect   EKG:  EKG is ordered today. The ekg ordered today demonstrates sinus rhythm, first-degree AV block, no acute ischemic changes   Recent Labs: No results found for requested labs within last 365 days.    Lipid Panel    Component Value Date/Time   CHOL 166 04/23/2008 2300   TRIG 90 04/23/2008 2300   HDL 64 04/23/2008 2300   CHOLHDL 2.6 Ratio 04/23/2008 2300   VLDL 18 04/23/2008 2300   LDLCALC 84 04/23/2008 2300     Wt Readings from Last 3 Encounters:  06/15/15 151 lb 12.8 oz (68.856 kg)  10/07/14 146 lb (66.225 kg)  08/28/14 146 lb 4.8 oz (66.361 kg)     Other studies Reviewed: Additional studies/ records that were reviewed today include: Hospital records, cath report and office notes.  ASSESSMENT AND PLAN:  1.  Chest pain: History of moderate coronary artery disease by remote cath, but her symptoms have some atypical features. She was supposed to get an echocardiogram and we will obtain this. We will also obtain nuclear stress test. I feel she would be able to do a treadmill and the patient is agreeable to this. She will follow-up with Dr. Antoine PocheHochrein after the procedure. She  wishes to delay the stress test until after Christmas and this is acceptable. She knows that she needs to contact us or call 911 for worsening symptoms.  2. PAD: She is to get follow-up Dopplers for her vascular disease and is encouraged to obtain these and follow up as scheduled.   Current medicines are reviewed at length with the patient today.  The patient does not have concerns regarding medicines.  The following changes have been made:  no change  Labs/ tests ordered today include:   Orders Placed This Encounter  Procedures  . Myocardial Perfusion Imaging  . EKG 12-Lead     Disposition:   FU with Dr. Antoine PocheHochrein  Signed, Leanna BattlesBarrett, Shanitha Twining, PA-C  06/15/2015 11:41 AM    [facility] [address], [place], [place]  [address] Phone: [phone]) [phone]; Fax: [phone]) [phone]

## 2015-06-15 ENCOUNTER — Ambulatory Visit (INDEPENDENT_AMBULATORY_CARE_PROVIDER_SITE_OTHER): Payer: Medicare Other | Admitting: Physician Assistant

## 2015-06-15 ENCOUNTER — Encounter: Payer: Self-pay | Admitting: Physician Assistant

## 2015-06-15 VITALS — BP 162/82 | HR 96 | Ht 61.0 in | Wt 151.8 lb

## 2015-06-15 DIAGNOSIS — I739 Peripheral vascular disease, unspecified: Secondary | ICD-10-CM

## 2015-06-15 DIAGNOSIS — R079 Chest pain, unspecified: Secondary | ICD-10-CM

## 2015-06-15 NOTE — Patient Instructions (Signed)
Medication Instructions:  Please continue your current medications  Labwork: NONE  Testing/Procedures: 1. Exercise Nuclear Stress Test - Your physician has requested that you have an exercise stress myoview. For further information please visit [email or web address]. Please follow instruction sheet, as given.  Follow-Up: Theodore Demarkhonda Barrett, PA-C, recommends that you schedule a follow-up appointment first available with Dr Antoine PocheHochrein.  If you need a refill on your cardiac medications before your next appointment, please call your pharmacy.

## 2015-06-17 ENCOUNTER — Other Ambulatory Visit: Payer: Self-pay | Admitting: Cardiology

## 2015-06-17 DIAGNOSIS — I701 Atherosclerosis of renal artery: Secondary | ICD-10-CM

## 2015-06-17 DIAGNOSIS — I6523 Occlusion and stenosis of bilateral carotid arteries: Secondary | ICD-10-CM

## 2015-06-24 ENCOUNTER — Telehealth: Payer: Self-pay | Admitting: Cardiology

## 2015-06-24 DIAGNOSIS — R079 Chest pain, unspecified: Secondary | ICD-10-CM

## 2015-06-24 DIAGNOSIS — Z01818 Encounter for other preprocedural examination: Secondary | ICD-10-CM

## 2015-06-24 DIAGNOSIS — R0602 Shortness of breath: Secondary | ICD-10-CM

## 2015-06-24 NOTE — Telephone Encounter (Signed)
New Message  Pt stated that she is supposed to have Echo when she has renal/carotid done next week. Pt was notified that there is no order for an echo- pt requested to speakw/ RN . Please call back and discuss.

## 2015-06-24 NOTE — Telephone Encounter (Signed)
SPOKE TO PATIENT ,SHE STATES SHE IS READY TO HAVE ALL HER TEST DONE . ECHO WAS NOT SCHEDULE EARLIER.  RN REVIEWED WITH RHONDA BARRETT PA, PLEASE SCHEDULE ECHO ALONG WITH OTHER TEST ALREADY SCHEDULE. DX SOB, PRE OP EXAM FOR POSSIBLE KNEE SURGERY PATIENT WOULD LIKE TO HAVE CAROTID ,RENAL IN THE MORNING  AND ECHO IN THE AFTERNOON. RN   INFORMED PATIENT WILL SCHEDULE BUT NOT SURE,IF IT CAN BE DONE ON THE SAME DAY

## 2015-06-25 ENCOUNTER — Telehealth (HOSPITAL_COMMUNITY): Payer: Self-pay

## 2015-06-25 NOTE — Telephone Encounter (Signed)
Encounter complete. 

## 2015-06-29 ENCOUNTER — Telehealth: Payer: Self-pay | Admitting: Cardiology

## 2015-06-29 ENCOUNTER — Telehealth (HOSPITAL_COMMUNITY): Payer: Self-pay

## 2015-06-29 NOTE — Telephone Encounter (Signed)
Encounter complete. 

## 2015-06-29 NOTE — Telephone Encounter (Signed)
[facility] [place] [facility] to [facility] Bystolic dose.Advised patient taking Bystolic 10 mg daily.

## 2015-06-30 ENCOUNTER — Ambulatory Visit (HOSPITAL_COMMUNITY)
Admission: RE | Admit: 2015-06-30 | Discharge: 2015-06-30 | Disposition: A | Discharge status: Home / Self Care | Payer: Medicare Other | Source: Ambulatory Visit | Attending: Physician Assistant | Admitting: Physician Assistant

## 2015-06-30 ENCOUNTER — Ambulatory Visit (HOSPITAL_COMMUNITY): Payer: Medicare Other

## 2015-06-30 DIAGNOSIS — R0602 Shortness of breath: Secondary | ICD-10-CM | POA: Insufficient documentation

## 2015-06-30 DIAGNOSIS — I739 Peripheral vascular disease, unspecified: Secondary | ICD-10-CM | POA: Insufficient documentation

## 2015-06-30 DIAGNOSIS — I10 Essential (primary) hypertension: Secondary | ICD-10-CM | POA: Insufficient documentation

## 2015-06-30 DIAGNOSIS — R079 Chest pain, unspecified: Secondary | ICD-10-CM | POA: Diagnosis present

## 2015-06-30 DIAGNOSIS — R5383 Other fatigue: Secondary | ICD-10-CM | POA: Diagnosis not present

## 2015-06-30 DIAGNOSIS — I779 Disorder of arteries and arterioles, unspecified: Secondary | ICD-10-CM | POA: Insufficient documentation

## 2015-06-30 DIAGNOSIS — Z87891 Personal history of nicotine dependence: Secondary | ICD-10-CM | POA: Diagnosis not present

## 2015-06-30 DIAGNOSIS — Z8249 Family history of ischemic heart disease and other diseases of the circulatory system: Secondary | ICD-10-CM | POA: Insufficient documentation

## 2015-06-30 DIAGNOSIS — R0609 Other forms of dyspnea: Secondary | ICD-10-CM | POA: Diagnosis not present

## 2015-06-30 DIAGNOSIS — R42 Dizziness and giddiness: Secondary | ICD-10-CM | POA: Diagnosis not present

## 2015-06-30 LAB — MYOCARDIAL PERFUSION IMAGING
CHL CUP NUCLEAR SDS: 1
CHL CUP NUCLEAR SRS: 0
CHL CUP RESTING HR STRESS: 58 {beats}/min
LV dias vol: 69 mL
LV sys vol: 21 mL
Peak HR: 74 {beats}/min
SSS: 1
TID: 1.12

## 2015-06-30 MED ORDER — TECHNETIUM TC 99M SESTAMIBI GENERIC - CARDIOLITE
31.2000 | Freq: Once | INTRAVENOUS | Status: AC | PRN
  Administered 2015-06-30: 31.2 via INTRAVENOUS

## 2015-06-30 MED ORDER — REGADENOSON 0.4 MG/5ML IV SOLN
0.4000 mg | Freq: Once | INTRAVENOUS | Status: AC
  Administered 2015-06-30: 0.4 mg via INTRAVENOUS

## 2015-06-30 MED ORDER — TECHNETIUM TC 99M SESTAMIBI GENERIC - CARDIOLITE
10.1000 | Freq: Once | INTRAVENOUS | Status: AC | PRN
  Administered 2015-06-30: 10.1 via INTRAVENOUS

## 2015-06-30 MED ORDER — AMINOPHYLLINE 25 MG/ML IV SOLN
75.0000 mg | Freq: Once | INTRAVENOUS | Status: AC
  Administered 2015-06-30: 75 mg via INTRAVENOUS

## 2015-07-01 ENCOUNTER — Ambulatory Visit (HOSPITAL_COMMUNITY): Admission: RE | Admit: 2015-07-01 | Payer: Medicare Other | Source: Ambulatory Visit

## 2015-07-01 ENCOUNTER — Ambulatory Visit: Payer: Medicare Other | Admitting: Physician Assistant

## 2015-07-01 ENCOUNTER — Encounter (HOSPITAL_COMMUNITY): Payer: Medicare Other

## 2015-07-05 ENCOUNTER — Ambulatory Visit: Payer: Medicare Other | Admitting: Cardiology

## 2015-07-09 ENCOUNTER — Encounter: Payer: Self-pay | Admitting: *Deleted

## 2015-08-10 ENCOUNTER — Encounter: Payer: Self-pay | Admitting: Internal Medicine

## 2015-08-10 ENCOUNTER — Ambulatory Visit (INDEPENDENT_AMBULATORY_CARE_PROVIDER_SITE_OTHER): Payer: Medicare Other | Admitting: Internal Medicine

## 2015-08-10 VITALS — BP 165/59 | HR 69 | Temp 98.1°F | Ht 61.0 in | Wt 155.0 lb

## 2015-08-10 DIAGNOSIS — K529 Noninfective gastroenteritis and colitis, unspecified: Secondary | ICD-10-CM

## 2015-08-10 DIAGNOSIS — R350 Frequency of micturition: Secondary | ICD-10-CM

## 2015-08-10 LAB — POCT URINALYSIS DIPSTICK
Bilirubin, UA: NEGATIVE
Glucose, UA: NEGATIVE
Ketones, UA: NEGATIVE
LEUKOCYTES UA: NEGATIVE
Nitrite, UA: NEGATIVE
PROTEIN UA: NEGATIVE
Spec Grav, UA: <=1.005
UROBILINOGEN UA: 0.2
pH, UA: 6

## 2015-08-10 LAB — POCT UA - MICROSCOPIC ONLY

## 2015-08-10 MED ORDER — FLUCONAZOLE 100 MG PO TABS
100.0000 mg | ORAL_TABLET | Freq: Every day | ORAL | Status: DC
Start: 2015-08-10 — End: 2016-04-03

## 2015-08-10 NOTE — Patient Instructions (Signed)
It was so nice to meet you!  Because you feel like the Diflucan helps your diarrhea, please take this medication for 7 days when you have a flare-up of your diarrhea.  - Dr. Nancy Marus

## 2015-08-10 NOTE — Assessment & Plan Note (Signed)
Pt continuing to have episodes of diarrhea. She states that the only thing that really helps is Diflucan. She would like this to be restarted. - Prescribed Diflucan  to be used prn for diarrhea - Precepted with Dr. Raymondo Band - Pt asked about trying Cholestyramine powder instead of Colestid powder. Can consider this in the future. - Pt encouraged to follow-up with her GI doctor

## 2015-08-10 NOTE — Progress Notes (Signed)
[facility] Phone: [phone]  Subjective:  Diarrhea: She has had diarrhea since 2006. 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 This morning, she felt like she had a hard stool that was preventing the rest of the stool from passing. She then all of the sudden had "projectile diarrhea". She had diarrhea all day on Sunday and then had diarrhea x 2 this morning. She took some Limodel, which helped. She states that Diflucan has helped her diarrhea in the past. She was prescribed Viberzi by Dr. Ewing Schlein, her gastroenterologist. She takes Colestid, which helps her diarrhea. No vomiting.  Dysuria: She has had suprapubic pain and dysuria for the last few weeks. She has a history of a bladder sling. She has increased urinary frequency. Her urine has been odorous lately. She takes Pyridium, which she thinks is helping her not feel the urinary tract infection.   All other ROS were reviewed and are negative unless otherwise noted in the HPI. Past Medical History- significant for bowel strangulation requiring partial colectomy, hx of diverticulitis Reviewed problem list.  Medications- reviewed and updated Current Outpatient Prescriptions  Medication Sig Dispense Refill  . aspirin 81 MG tablet Take 81 mg by mouth daily.    . cetirizine (ZYRTEC ALLERGY) 10 MG tablet Take 1 tablet (10 mg total) by mouth daily. 30 tablet 3  . clotrimazole-betamethasone (LOTRISONE) cream     . colestipol (COLESTID) 5 G packet Take 5 g by mouth 4 (four) times daily as needed (diarrhea). 30 each 12  . DIOVAN 80 MG tablet TAKE 1 TABLET BY MOUTH DAILY 30 tablet 6  . diphenoxylate-atropine (LOMOTIL) 2.5-0.025 MG per tablet Take 1 tablet by mouth 4 (four) times daily as needed for diarrhea or loose stools. 30 tablet 0  . folic acid (FOLVITE) 1 MG tablet Take 1 tablet (1 mg total) by mouth daily. NEED OV. 90 tablet 0  . iron polysaccharides (NU-IRON) 150 MG capsule Take 150 mg by mouth  daily.    . meclizine (ANTIVERT) 25 MG tablet Take 1 tablet (25 mg total) by mouth 3 (three) times daily as needed. 60 tablet 3  . MOBIC 15 MG tablet Take 1 tablet (15 mg total) by mouth daily. 30 tablet 3  . Multiple Vitamins-Minerals (CENTRUM SILVER ULTRA WOMENS PO) Take 1 tablet by mouth every morning.    . nebivolol (BYSTOLIC) 10 MG tablet Take 10 mg by mouth daily.    Marland Kitchen NEXIUM 40 MG capsule TAKE ONE CAPSULE BY MOUTH EVERY DAY 90 capsule 2  . nitroGLYCERIN (NITROSTAT) 0.4 MG SL tablet PLACE 1 TABLET(0.4MG ) UNDER THE TONGUE EVERY 5 MINUTES AS NEEDED FOR CHEST PAIN 25 tablet 1  . phenazopyridine (PYRIDIUM) 200 MG tablet TAKE 1 TABLET BY MOUTH THREE TIMES DAILY AS NEEDED FOR PAIN 270 tablet 1  . PROAIR HFA 108 (90 BASE) MCG/ACT inhaler Inhale 1 puff into the lungs daily as needed.  1  . SINGULAIR 10 MG tablet TAKE 1 TABLET BY MOUTH EVERY NIGHT AT BEDTIME 30 tablet 3  . traMADol (ULTRAM) 50 MG tablet TAKE 1 TABLET BY MOUTH TWICE DAILY AS NEEDED FOR PAIN 60 tablet 1  . zolpidem (AMBIEN CR) 12.5 MG CR tablet Take 1 tablet (12.5 mg total) by mouth at bedtime as needed for sleep. 15 tablet 3   No current facility-administered medications for this visit.   Chief complaint-noted Family history reviewed for today's visit. No changes. Social history- patient is a former smoker  Objective: BP  165/59 mmHg  Pulse 69  Temp(Src) 98.1 F (36.7 C) (Oral)  Ht  (1.549 m)  Wt 155 lb (70.308 kg)  BMI 29.30 kg/m2 Gen: NAD, alert, cooperative with exam HEENT: NCAT, EOMI, MMM Neck: FROM, supple Resp: Normal work of breathing GI: SNTND, BS present, no guarding or organomegaly Msk: No edema, warm, normal tone, moves UE/LE spontaneously Neuro: Alert and oriented, no gross deficits Skin: no rashes, no lesions Psych: Appropriate behavior  Assessment/Plan: Diarrhea: Pt continuing to have episodes of diarrhea. She states that the only thing that really helps is Diflucan. She would like this to be  restarted. - Prescribed Diflucan  to be used prn for diarrhea - Precepted with Dr. Raymondo Band - Pt asked about trying Cholestyramine powder instead of Colestid powder. Can consider this in the future. - Pt encouraged to follow-up with her GI doctor  Dysuria: - UA ordered and is negative for signs of infection - Follow-up as needed   Willadean Carol, MD PGY-1

## 2015-09-13 ENCOUNTER — Other Ambulatory Visit: Payer: Self-pay | Admitting: Cardiology

## 2015-09-13 DIAGNOSIS — R0602 Shortness of breath: Secondary | ICD-10-CM

## 2015-09-13 DIAGNOSIS — R0600 Dyspnea, unspecified: Secondary | ICD-10-CM

## 2015-09-15 ENCOUNTER — Ambulatory Visit: Payer: Medicare Other | Admitting: Internal Medicine

## 2015-09-17 ENCOUNTER — Inpatient Hospital Stay (HOSPITAL_COMMUNITY): Admission: RE | Admit: 2015-09-17 | Payer: Medicare Other | Source: Ambulatory Visit

## 2015-09-17 ENCOUNTER — Ambulatory Visit (INDEPENDENT_AMBULATORY_CARE_PROVIDER_SITE_OTHER): Payer: Medicare Other | Admitting: Internal Medicine

## 2015-09-17 ENCOUNTER — Encounter: Payer: Self-pay | Admitting: Internal Medicine

## 2015-09-17 VITALS — BP 155/71 | HR 70 | Temp 98.0°F | Ht 61.0 in | Wt 151.0 lb

## 2015-09-17 DIAGNOSIS — F4321 Adjustment disorder with depressed mood: Secondary | ICD-10-CM

## 2015-09-17 DIAGNOSIS — N390 Urinary tract infection, site not specified: Secondary | ICD-10-CM | POA: Diagnosis not present

## 2015-09-17 DIAGNOSIS — R300 Dysuria: Secondary | ICD-10-CM

## 2015-09-17 LAB — POCT URINALYSIS DIPSTICK
BILIRUBIN UA: NEGATIVE
Blood, UA: NEGATIVE
GLUCOSE UA: NEGATIVE
KETONES UA: NEGATIVE
NITRITE UA: POSITIVE
Protein, UA: NEGATIVE
Spec Grav, UA: 1.01
Urobilinogen, UA: 0.2
pH, UA: 5.5

## 2015-09-17 LAB — POCT UA - MICROSCOPIC ONLY

## 2015-09-17 MED ORDER — CEPHALEXIN 500 MG PO CAPS: 500.0000 mg | ORAL_CAPSULE | Freq: Two times a day (BID) | ORAL | Status: DC

## 2015-09-17 NOTE — Progress Notes (Signed)
Dr. Nancy MarusMayo requested a Behavioral Health Consult.   Presenting Issue: Ms. [name] presented with depressive grief-related symptoms related to the death of her dog.  Report of symptoms: Depressed mood, lack of interest, motivation, and pleasure in activities, fatigue, psychomotor retardation. Sleep and appetite are normal. Mood and anhedonia symptoms are present all day, every day per patient.  Duration of CURRENT symptoms: Patient reports that her symptoms have been present since her dog died on June 01, 2015.  Age of onset of first mood disturbance: Not assessed.  Impact on function: Patient denied significant affect on life functioning.  Psychiatric History - Diagnoses: Major Depressive Disorder, recurrent (per chart) - Hospitalizations:Not assessed - Pharmacotherapy: Tramadol (per chart). Patient voiced opposition to medication in general (all medication, not only psychopharmacological medication) - Outpatient therapy: None currently; did not assess history.  Family history of psychiatric issues: Did not assess.  Current and history of substance use: Patient is a member of AA and had an alcohol use disorder in the past. Reports that she has remained sober for 23 years and has no desire to start drinking again. Patient stated "that would be like putting a bullet in my head....there's no way I would do that."  Medical conditions that might explain or contribute to symptoms: None  Assessment / Plan / Recommendations:Patient reported that she had to put down her 73 year-old dog in December of last year, and that she has been grieving since that time. Patient became tearful on several occasion when discussing her dog. Since that time, she has had four depressive symptoms described above (five required for a major depressive episode), and reports that these symptoms are present all day, every day. Patient noted that her most bothersome symptom is a lack of motivation to engage in enjoyable  activities. However, patient also has medical condition (knee problems) that prevent her from participating in much physical activity. Patient reported that she currently has no close relationships, and no one that she can talk to about her grief. Patient also voiced negative beliefs about people in general (e.g., stating that most people are very shallow and don't care about others), and reported that she becomes angry when people attempt to empathize, saying that "no one can understand what I'm going through". She also stated that her entire life revolved around her dog. However, patient also smiled and was cheerful when discussing happy memories of her dog. [facility] discussed importance of behavioral activation and building social support for overcoming feelings of grief and depression. [place] [facility]. However, patient will attempt to schedule her next PCP appointment on a Tuesday or Friday so that she can [facility]. Patient's depressive symptoms should be carefully monitored, and may benefit from starting an SSRI at her next appointment should symptoms persist (although potential adherence seems poor given negative beliefs about medication).

## 2015-09-17 NOTE — Patient Instructions (Addendum)
It was so nice to see you!   You have a urinary tract infection. I have prescribed Keflex. Please take 1 tablet twice a day for 7 days.   If your symptoms are not better, please come back to see us!  -Dr. Nancy MarusMayo

## 2015-09-17 NOTE — Assessment & Plan Note (Signed)
Pt experiencing grief after the death of her dog in December. - [facility]. Pt having some features of atypical grief. Not meeting criteria for Major Depressive Episode at this point. Will monitor closely. - May need to restart Celexa 20mg  daily, which she was previously been on. Pt does not want to restart medications at this time. - Follow-up in 3 months or if symptoms worsen

## 2015-09-17 NOTE — Assessment & Plan Note (Signed)
Pt with dysuria, urinary frequency, urinary urgency, and incomplete bladder emptying. No fevers, no chills, no back pain. - UA performed in clinic- trace leukocytes, +nitrites - Given her symptoms and +leukocytes and +nitrites on exam, will treat with Keflex 500mg  bid x 7 days - Follow-up if no improvement in symptoms

## 2015-09-17 NOTE — Progress Notes (Signed)
[name] Phone: [phone]  Subjective:  Dysuria: Has been going on for 1 month. She also suprapubic pain. No fevers or chills. She has had urinary urgency and frequency. She feels like she cannot completely empty her bladder. No back pain. She notes her urine has been darker in color. She has been taking Azo pills, which has been helping a little.   Grief: Pt notes that she has been experiencing grief since the death of her dog in 06/02/2015. She states that she "loved her dog way more than she loves any of her 5 children". She has had a difficult time adjusting to her dog's death. She states that she would like to get another dog, but cannot afford it right now. She has a history of major depressive episodes, but she states she does not feel like she is having one right now. She has been on Celexa  in the past, but states she would not like to be on any medication at this point. She thinks she can "pull through this". Denies HI/SI.  ROS: See HPI for pertinent positives and negatives Past Medical History- Major depression, IBS with chronic diarrhea, stress incontinence Reviewed problem list.  Medications- reviewed and updated Current Outpatient Prescriptions  Medication Sig Dispense Refill  . aspirin 81 MG tablet Take 81 mg by mouth daily.    . cetirizine (ZYRTEC ALLERGY) 10 MG tablet Take 1 tablet (10 mg total) by mouth daily. 30 tablet 3  . clotrimazole-betamethasone (LOTRISONE) cream     . colestipol (COLESTID) 5 G packet Take 5 g by mouth 4 (four) times daily as needed (diarrhea). 30 each 12  . DIOVAN 80 MG tablet TAKE 1 TABLET BY MOUTH DAILY 30 tablet 6  . diphenoxylate-atropine (LOMOTIL) 2.5-0.025 MG per tablet Take 1 tablet by mouth 4 (four) times daily as needed for diarrhea or loose stools. 30 tablet 0  . fluconazole (DIFLUCAN) 100 MG tablet Take 1 tablet (100 mg total) by mouth daily. 30 tablet 2  . folic acid (FOLVITE) 1 MG tablet Take 1 tablet  (1 mg total) by mouth daily. NEED OV. 90 tablet 0  . iron polysaccharides (NU-IRON) 150 MG capsule Take 150 mg by mouth daily.    . meclizine (ANTIVERT) 25 MG tablet Take 1 tablet (25 mg total) by mouth 3 (three) times daily as needed. 60 tablet 3  . MOBIC 15 MG tablet Take 1 tablet (15 mg total) by mouth daily. 30 tablet 3  . Multiple Vitamins-Minerals (CENTRUM SILVER ULTRA WOMENS PO) Take 1 tablet by mouth every morning.    . nebivolol (BYSTOLIC) 10 MG tablet Take 10 mg by mouth daily.    Marland Kitchen NEXIUM 40 MG capsule TAKE ONE CAPSULE BY MOUTH EVERY DAY 90 capsule 2  . nitroGLYCERIN (NITROSTAT) 0.4 MG SL tablet PLACE 1 TABLET(0.4MG ) UNDER THE TONGUE EVERY 5 MINUTES AS NEEDED FOR CHEST PAIN 25 tablet 1  . phenazopyridine (PYRIDIUM) 200 MG tablet TAKE 1 TABLET BY MOUTH THREE TIMES DAILY AS NEEDED FOR PAIN 270 tablet 1  . PROAIR HFA 108 (90 BASE) MCG/ACT inhaler Inhale 1 puff into the lungs daily as needed.  1  . SINGULAIR 10 MG tablet TAKE 1 TABLET BY MOUTH EVERY NIGHT AT BEDTIME 30 tablet 3  . traMADol (ULTRAM) 50 MG tablet TAKE 1 TABLET BY MOUTH TWICE DAILY AS NEEDED FOR PAIN 60 tablet 1  . zolpidem (AMBIEN CR) 12.5 MG CR tablet Take 1 tablet (12.5 mg total) by mouth at  bedtime as needed for sleep. 15 tablet 3   No current facility-administered medications for this visit.   Chief complaint-noted Family history reviewed for today's visit. No changes. Social history- patient is a former smoker  Objective: BP 155/71 mmHg  Pulse 70  Temp(Src) 98 F (36.7 C) (Oral)  Ht 5\' 1"  (1.549 m)  Wt 151 lb (68.493 kg)  BMI 28.55 kg/m2 Gen: Tearful when discussing the death of her dog, but smiles when talking about joyful memories. HEENT: NCAT, EOMI, MMM Neck: FROM Resp: Normal work of breathing GI: Suprapubic tenderness present Msk: Moves UE/LE spontaneously Neuro: Alert and oriented, no gross deficits Skin: No rashes, no lesions Psych: Occasionally tearful, affect is normal, normal  speech  Assessment/Plan: Urinary Tract Infection: Pt with dysuria, urinary frequency, urinary urgency, and incomplete bladder emptying. No fevers, no chills, no back pain. - UA performed in clinic- trace leukocytes, +nitrites - Given her symptoms and +leukocytes and +nitrites on exam, will treat with Keflex 500mg  bid x 7 days - Follow-up if no improvement in symptoms  Grief: Pt experiencing grief after the death of her dog in December. - [facility]. Pt having some features of atypical grief. Not meeting criteria for Major Depressive Episode at this point. Will monitor closely. - May need to restart Celexa 20mg  daily, which she was previously been on. Pt does not want to restart medications at this time. - Follow-up in 3 months or if symptoms worsen   Willadean CarolKaty Murial Beam, MD PGY-1

## 2015-09-21 ENCOUNTER — Telehealth: Payer: Self-pay | Admitting: Internal Medicine

## 2015-09-21 ENCOUNTER — Ambulatory Visit (HOSPITAL_BASED_OUTPATIENT_CLINIC_OR_DEPARTMENT_OTHER)
Admission: RE | Admit: 2015-09-21 | Discharge: 2015-09-21 | Disposition: A | Discharge status: Home / Self Care | Payer: Medicare Other | Source: Ambulatory Visit | Attending: Cardiology | Admitting: Cardiology

## 2015-09-21 ENCOUNTER — Ambulatory Visit (HOSPITAL_COMMUNITY)
Admission: RE | Admit: 2015-09-21 | Discharge: 2015-09-21 | Disposition: A | Discharge status: Home / Self Care | Payer: Medicare Other | Source: Ambulatory Visit | Attending: Cardiology | Admitting: Cardiology

## 2015-09-21 ENCOUNTER — Ambulatory Visit (HOSPITAL_COMMUNITY)
Admission: RE | Admit: 2015-09-21 | Discharge: 2015-09-21 | Disposition: A | Discharge status: Home / Self Care | Payer: Medicare Other | Source: Ambulatory Visit | Attending: Internal Medicine | Admitting: Internal Medicine

## 2015-09-21 DIAGNOSIS — E785 Hyperlipidemia, unspecified: Secondary | ICD-10-CM | POA: Insufficient documentation

## 2015-09-21 DIAGNOSIS — Z87891 Personal history of nicotine dependence: Secondary | ICD-10-CM | POA: Insufficient documentation

## 2015-09-21 DIAGNOSIS — I701 Atherosclerosis of renal artery: Secondary | ICD-10-CM | POA: Insufficient documentation

## 2015-09-21 DIAGNOSIS — I059 Rheumatic mitral valve disease, unspecified: Secondary | ICD-10-CM | POA: Diagnosis not present

## 2015-09-21 DIAGNOSIS — I6523 Occlusion and stenosis of bilateral carotid arteries: Secondary | ICD-10-CM

## 2015-09-21 DIAGNOSIS — R0602 Shortness of breath: Secondary | ICD-10-CM

## 2015-09-21 DIAGNOSIS — R0600 Dyspnea, unspecified: Secondary | ICD-10-CM

## 2015-09-21 DIAGNOSIS — I119 Hypertensive heart disease without heart failure: Secondary | ICD-10-CM | POA: Insufficient documentation

## 2015-09-21 NOTE — Telephone Encounter (Signed)
Please call [name] at earliest convenience.

## 2015-09-21 NOTE — Telephone Encounter (Signed)
Pt is calling because her insurance will not pay for her Keflex. She said that the doctor was going to do a prior authorization so that she could pick this up. She is hoping that we can do this ASAP or if you have to call in something that her insurance will cover. jw

## 2015-09-21 NOTE — Telephone Encounter (Signed)
Please let Ms. Devincent know that Medicare does not allow Medicare Part D plans to cover drugs produced by a manufacturer that does not participate in [employer] Program. She will need to call her insurance company to check on her formulary. The pharmacy could also try a different manufacturer.  Thank you!

## 2015-09-21 NOTE — Telephone Encounter (Signed)
Will check with pcp and triage nurse to see if PA has already been received from pharmacy regarding this medication. Dyamon Sosinski,CMA

## 2015-09-21 NOTE — Telephone Encounter (Signed)
PA was denied via [employer].  Medicare does not allow Medicare Part D plans to cover drugs produced by a manufacturer that does not participate in [facility]. Patient would need to call her insurance to check on her formulary.  The pharmacy also could try a different manufacturer. Clovis PuMartin, Tamika L, RN

## 2015-09-21 NOTE — Telephone Encounter (Signed)
Dr. Nancy MarusMayo states that she filled out PA for this medication yesterday.  Patient is aware of this and will wait to hear from us regarding approval.  Alejandra Barna,CMA

## 2015-09-21 NOTE — Telephone Encounter (Signed)
Spoke with patient and she states that she was informed by pharmacy that she can use the generic and this is covered by her insurance.  States that she is allergic to the generic and can't take it.  She will need the override on Keflex so she can get the brand name.  Patient states that she is unable to find out the formulary details for her insurance.  Yeshaya Vath,CMA

## 2015-09-21 NOTE — Telephone Encounter (Signed)
Nurse has not received any prior authorization for this medication.  Will call pharmacy to get insurance ID and phone number.  Patient will need to call her insurance for formulary details.  Clovis PuMartin, Tamika L, RN

## 2015-09-23 NOTE — Telephone Encounter (Signed)
LM for patient to call back. Jazmin Hartsell,CMA  

## 2015-09-23 NOTE — Telephone Encounter (Signed)
Please see previous phone notes. [name],CMA

## 2015-10-08 ENCOUNTER — Other Ambulatory Visit: Payer: Self-pay | Admitting: Physician Assistant

## 2015-10-08 NOTE — Telephone Encounter (Signed)
REFILL 

## 2015-10-12 ENCOUNTER — Ambulatory Visit: Payer: Medicare Other | Admitting: Internal Medicine

## 2015-11-05 ENCOUNTER — Ambulatory Visit: Payer: Medicare Other | Admitting: Internal Medicine

## 2015-11-18 ENCOUNTER — Other Ambulatory Visit: Payer: Self-pay | Admitting: Cardiology

## 2015-12-24 ENCOUNTER — Other Ambulatory Visit: Payer: Self-pay | Admitting: Gastroenterology

## 2015-12-24 DIAGNOSIS — Z1231 Encounter for screening mammogram for malignant neoplasm of breast: Secondary | ICD-10-CM

## 2015-12-27 ENCOUNTER — Ambulatory Visit: Payer: Medicare Other | Admitting: Internal Medicine

## 2015-12-29 ENCOUNTER — Ambulatory Visit
Admission: RE | Admit: 2015-12-29 | Discharge: 2015-12-29 | Disposition: A | Discharge status: Home / Self Care | Payer: Medicare Other | Source: Ambulatory Visit | Attending: Gastroenterology | Admitting: Gastroenterology

## 2015-12-29 DIAGNOSIS — Z1231 Encounter for screening mammogram for malignant neoplasm of breast: Secondary | ICD-10-CM

## 2016-02-01 ENCOUNTER — Ambulatory Visit: Payer: Medicare Other | Admitting: Obstetrics and Gynecology

## 2016-02-09 ENCOUNTER — Ambulatory Visit (INDEPENDENT_AMBULATORY_CARE_PROVIDER_SITE_OTHER): Payer: Medicare Other | Admitting: Family Medicine

## 2016-02-09 VITALS — BP 148/98 | HR 72 | Temp 98.6°F | Wt 150.4 lb

## 2016-02-09 DIAGNOSIS — I701 Atherosclerosis of renal artery: Secondary | ICD-10-CM | POA: Diagnosis not present

## 2016-02-09 DIAGNOSIS — N3090 Cystitis, unspecified without hematuria: Secondary | ICD-10-CM | POA: Diagnosis present

## 2016-02-09 DIAGNOSIS — N390 Urinary tract infection, site not specified: Secondary | ICD-10-CM

## 2016-02-09 LAB — POCT URINALYSIS DIPSTICK
Bilirubin, UA: NEGATIVE
GLUCOSE UA: NEGATIVE
Ketones, UA: NEGATIVE
LEUKOCYTES UA: NEGATIVE
NITRITE UA: POSITIVE
Protein, UA: NEGATIVE
RBC UA: NEGATIVE
Spec Grav, UA: 1.01
UROBILINOGEN UA: 0.2
pH, UA: 5.5

## 2016-02-09 LAB — POCT UA - MICROSCOPIC ONLY

## 2016-02-09 MED ORDER — AMOXICILLIN-POT CLAVULANATE 400-57 MG/5ML PO SUSR: 500.0000 mg | Freq: Two times a day (BID) | ORAL | 0 refills | Status: DC

## 2016-02-09 MED ORDER — AMOXICILLIN-POT CLAVULANATE 400-57 MG/5ML PO SUSR: 500.0000 mg | Freq: Two times a day (BID) | ORAL | Status: DC

## 2016-02-09 NOTE — Assessment & Plan Note (Signed)
  UA today positive for nitrates. Urine culture pending. Not concerning for pyelonephritis, no fever or flank pain. May have some component of interstitial cystitis.   -Will treat with 10 day course of Augmentin, 500mg  twice a day -She has follow up with PCP in 1 month

## 2016-02-09 NOTE — Progress Notes (Signed)
   Subjective:    Patient ID: [name], female    DOB: 09/08/1940, 75 y.o.   MRN: [id number]   CC: bladder infection  HPI: Ms. [name] is here today with concerns about a bladder infection. She feels that she has had this since her visit in March when she was treated with a course of Keflex. Antibiotics did help for a while but her symptoms returned. [place] and took a course of Suprax, which again helped for a short while but her symptoms are back today.  She reports suprapubic pain that is constant in nature. She denies flank pain. She also reports dysuria and increased urinary frequency. She denies fevers and chills. Reports mild nausea. No vomiting.   She has had recurrent issues with UTIs/Bladder infections since she got a bladder sling placed in 2003, she is requesting to see a different urologist [place] to discuss better management of this.   Smoking status reviewed- former smoker  Review of Systems- see HPI   Objective:  BP (!) 148/98 (BP Location: Left Arm, Patient Position: Sitting, Cuff Size: Normal)   Pulse 72   Temp 98.6 F (37 C) (Oral)   Wt 150 lb 6.4 oz (68.2 kg)   BMI 28.42 kg/m  Vitals and nursing note reviewed  General: well nourished, in NAD HEENT: normocephalic, no scleral icterus or conjunctival pallor, no nasal discharge, moist mucous membranes Cardiac: RRR, clear S1 and S2, no murmurs, rubs, or gallops Respiratory: clear to auscultation bilaterally, no increased work of breathing Abdomen: soft, mild suprapubic tenderness, non-distended. No flank tenderness. Extremities: no edema or cyanosis. Warm, well perfused. Skin: warm and dry, no rashes noted Neuro: alert and oriented, no focal deficits   Assessment & Plan:    UTI (urinary tract infection)  UA today positive for nitrates. Urine culture pending. Not concerning for pyelonephritis, no fever or flank pain. May have some component of  interstitial cystitis.   -Will treat with 10 day course of Augmentin, 500mg  twice a day -She has follow up with PCP in 1 month  Bladder infection  Has chronic infections since bladder sling in 2003, has seen urologist but feels things have gotten worse. Requesting referral to new urologist.  -Referral made to Dr. Art [place]Benet in [place], [place] per patient's request -patient told to call us if she does not hear about this referral in the next week   Megan PattyAngela Daizy Outen, DO Family Medicine Resident PGY-1

## 2016-02-09 NOTE — Patient Instructions (Signed)
   Please take Augmentin twice a day for ten days.  I sent a referral to urology for you, you will hear from our referral department within a week.

## 2016-02-09 NOTE — Assessment & Plan Note (Signed)
  Has chronic infections since bladder sling in 2003, has seen urologist but feels things have gotten worse. Requesting referral to new urologist.  -Referral [place] to [place]et [place] per patient's request -patient told to call us if she does not hear about this referral in the next week

## 2016-02-10 ENCOUNTER — Telehealth: Payer: Self-pay | Admitting: Internal Medicine

## 2016-02-10 NOTE — Telephone Encounter (Signed)
Pt called and would like a suppository that she can use for the pain in vaginal pain. jw

## 2016-02-10 NOTE — Telephone Encounter (Signed)
Will forward to Dr. Wonda Oldsiccio to see if medication can be called in. [facility] Hartsell,CMA

## 2016-02-11 ENCOUNTER — Ambulatory Visit: Payer: Medicare Other | Admitting: Family Medicine

## 2016-02-11 LAB — URINE CULTURE: ORGANISM ID, BACTERIA: NO GROWTH

## 2016-02-11 NOTE — Telephone Encounter (Signed)
Pt called again for pain medicaiton.  Please advise

## 2016-02-11 NOTE — Telephone Encounter (Signed)
Will forward to MD. [name],CMA  

## 2016-02-11 NOTE — Telephone Encounter (Signed)
  Spoke to patient, she is reporting increased pain and nausea. She started taking Augmentin yesterday evening. Advised her to come into clinic to be evaluated but [place] I advised her strongly to go to urgent care or the ED [place]. She verbalized understanding that she may have an infection in her kidneys that needs re-evaluation. She will keep taking Augmentin until she is re-evaluated.

## 2016-02-16 ENCOUNTER — Telehealth: Payer: Self-pay | Admitting: Internal Medicine

## 2016-02-16 NOTE — Telephone Encounter (Signed)
LMOVM with appt info. Pt's urology appt is 9/18 @ 9:30 AM at [facility] in [place].    [phone] [address] [place], [place] [id number] [phone]

## 2016-03-10 ENCOUNTER — Ambulatory Visit (INDEPENDENT_AMBULATORY_CARE_PROVIDER_SITE_OTHER): Payer: Medicare Other | Admitting: Internal Medicine

## 2016-03-10 ENCOUNTER — Encounter: Payer: Self-pay | Admitting: Internal Medicine

## 2016-03-10 VITALS — BP 154/68 | HR 73 | Temp 98.1°F | Ht 61.0 in | Wt 153.6 lb

## 2016-03-10 DIAGNOSIS — R51 Headache: Secondary | ICD-10-CM | POA: Diagnosis not present

## 2016-03-10 DIAGNOSIS — I10 Essential (primary) hypertension: Secondary | ICD-10-CM

## 2016-03-10 DIAGNOSIS — I701 Atherosclerosis of renal artery: Secondary | ICD-10-CM | POA: Diagnosis not present

## 2016-03-10 DIAGNOSIS — D509 Iron deficiency anemia, unspecified: Secondary | ICD-10-CM | POA: Diagnosis not present

## 2016-03-10 DIAGNOSIS — R519 Headache, unspecified: Secondary | ICD-10-CM | POA: Insufficient documentation

## 2016-03-10 LAB — POCT HEMOGLOBIN: HEMOGLOBIN: 9.6 g/dL — AB (ref 12.2–16.2)

## 2016-03-10 NOTE — Assessment & Plan Note (Signed)
Headaches occur over her whole head. No red flag symptoms. Neuro exam is completely normal. States her headaches started after she received IV iron. Think allergies may also be playing a role, as she has tenderness to palpation of her sinuses and her nasal turbinates are edematous. - Can continue using Excedrin 1/2 tablet as needed  - Continue Zyrtec 10mg  daily - Advised that Pt buy an over-the-counter nasal spray to help with her symptoms. - Return precautions given - Follow-up if not improving

## 2016-03-10 NOTE — Assessment & Plan Note (Signed)
Controlled. BP 154/68. Goal < 160/100.  - Continue Valsartan 80mg  daily and Nebivolol 10mg  daily - Follow-up in 6

## 2016-03-10 NOTE — Progress Notes (Signed)
[name] Phone: [phone]  Subjective:  Anemia: Has had anemia for 10 years. Pt seen by PCP in [place] area because she lives in [place] for half the year and [place] for half the year. Has also been seen by Hematology in the [place] area. Was given IV iron by Hematology and her labs were as follows- Iron 171, Ferritin 1628, Hgb 8.7, Hct 27.7, MCV 90, reticulocytes 4.3. She just had a colonoscopy last year, which was normal. She is scheduled for endoscopy with GI this month. No hematochezia, no melena.  Headaches: She has been taking 1/2 of an Excedrin headache tablet, which has been helping. The headaches started after she got the IV iron. The headache is located throughout her whole head. She also feels like her allergies are getting worse recently, which may be contributing. No photophobia or blurry vision. Headache does not wake her up at night. No nausea, no vomiting, no fevers. No weakness of her extremities.  Hypertension: Has not been checking blood pressures at home. She took her blood pressure medications this morning. No chest pain. She endorses mild lower extremity edema from being on her feet a lot yesterday.   ROS: See HPI for pertinent positives and negatives  Past Medical History- HTN, CAD, IBS w/ diarrhea, hx multiple UTIs w/ drug resistant bacteria, iron deficiency anemia.  Family history reviewed for today's visit. No changes.  Social history- patient is a former smoker. 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  Objective: BP (!) 164/69   Pulse 73   Temp 98.1 F (36.7 C) (Oral)   Ht 5\' 1"  (1.549 m)   Wt 153 lb 9.6 oz (69.7 kg)   BMI 29.02 kg/m  Gen: NAD, alert, cooperative with exam HEENT: NCAT, EOMI, MMM, tenderness to palpation of frontal and maxillary sinuses, nasal turbinates appear pale and edematous Neck: FROM, supple, no carotid bruits or JVD CV: RRR, no murmurs Resp: CTABL, no wheezes,  normal work of breathing Msk: Mild pretibial edema bilaterally, warm, normal tone, moves UE/LE spontaneously Neuro: Alert and oriented, CN 2-12 grossly intact, 5/5 muscle strength in upper and lower extremities bilaterally, sensation intact to light touch in upper and lower extremities Skin: No rashes, no lesions Psych: Appropriate behavior, normal affect.  Assessment/Plan: Anemia: Following with Hematology in the [place] area. Has recently received two doses of IV iron, with subsequent labs showing iron overload- Iron 171, Ferritin 1628, Hgb 8.7, Hct 27.2, reticulocytes 4.3, folate normal, B12 normal (per paperwork that Pt brought with her). Per chart review, previous labs done in our system show chronic normocytic anemia. She had a colonoscopy last year, which was normal. Differentials include iron deficiency, thalassemia, upper GI bleed. Have ruled out lower GI bleed, folate deficiency, B12 deficiency.  - Will check a hemoglobin today to see how she has responded to the IV iron - Defer further work-up to hematology - Pt should also follow-up with gastroenterology- she has an appointment scheduled later this month for endoscopy. - Return precautions given - Follow-up as needed  Headaches: Headaches occur over her whole head. No red flag symptoms. Neuro exam is completely normal. States her headaches started after she received IV iron. Think allergies may also be playing a role, as she has tenderness to palpation of her sinuses and her nasal turbinates are edematous. - Can continue using Excedrin 1/2 tablet as needed  - Continue Zyrtec 10mg  daily - Advised that Pt buy an over-the-counter nasal spray  to help with her symptoms. - Return precautions given - Follow-up if not improving  Hypertension: Controlled. BP 154/68. Goal < 160/100.  - Continue Valsartan 80mg  daily and Nebivolol 10mg  daily - Follow-up in 6 months   Willadean CarolKaty Mayo, MD PGY-2

## 2016-03-10 NOTE — Patient Instructions (Addendum)
It was so nice to see you!  You can continue to take the Excedrin for headaches. It is very important that you continue to follow-up with the hematologist and the gastroenterologist for further work-up.  We will see you back in 3 months.  -Dr. Nancy MarusMayo

## 2016-03-10 NOTE — Assessment & Plan Note (Signed)
Following with Hematology in the [place] area. Has recently received two doses of IV iron, with subsequent labs showing iron overload- Iron 171, Ferritin 1628, Hgb 8.7, Hct 27.2, reticulocytes 4.3, folate normal, B12 normal (per paperwork that Pt brought with her). Per chart review, previous labs done in our system show chronic normocytic anemia. She had a colonoscopy last year, which was normal. Differentials include iron deficiency, thalassemia, upper GI bleed. Have ruled out lower GI bleed, folate deficiency, B12 deficiency.  - Will check a hemoglobin today to see how she has responded to the IV iron - Defer further work-up to hematology - Pt should also follow-up with gastroenterology- she has an appointment scheduled later this month for endoscopy. - Return precautions given - Follow-up as needed

## 2016-04-02 NOTE — Progress Notes (Signed)
Subjective:     Patient ID: [name], female   DOB: [birth year], 75 y.o.   MRN: [id number]  HPI [name] is a 75yo female presenting today for concerns of bronchitis. -Reports history of COPD, although she has never had PFTs. Currently using pro-air, Singulair, Dulera. -Reports productive cough 1 week. Also noted a sore throat and sneezing. -Denies fever, headache, chest pain, shortness of breath -Has been gargling salt water without relief. Has also taken course of doxycycline which she had at home without relief. -Prefers Augmentin suspension -Seeing cardiology later today for blood pressure management  Review of Systems Per HPI.    Objective:   Physical Exam  Constitutional: She appears well-developed and well-nourished. No distress.  Cardiovascular: Normal rate and regular rhythm.   No murmur heard. Pulmonary/Chest: Effort normal. No respiratory distress. She has no wheezes.  Musculoskeletal: She exhibits no edema.  Psychiatric: She has a normal mood and affect. Her behavior is normal.      Assessment and Plan:     1. COPD exacerbation (HCC) - Completed course of Doxycycline without relief. Will prescribe course of Augmentin and Prednisone - Return for Pneumonia shot - Return for PFTs - Return if symptoms worsen or fail to resolve.  2. Hypertension - Follow up with Cardiology as scheduled for management

## 2016-04-03 ENCOUNTER — Encounter: Payer: Self-pay | Admitting: Family Medicine

## 2016-04-03 ENCOUNTER — Ambulatory Visit (INDEPENDENT_AMBULATORY_CARE_PROVIDER_SITE_OTHER): Payer: Medicare Other | Admitting: Physician Assistant

## 2016-04-03 ENCOUNTER — Encounter: Payer: Self-pay | Admitting: Physician Assistant

## 2016-04-03 ENCOUNTER — Ambulatory Visit (INDEPENDENT_AMBULATORY_CARE_PROVIDER_SITE_OTHER): Payer: Medicare Other | Admitting: Family Medicine

## 2016-04-03 VITALS — BP 158/68 | HR 70 | Ht 61.0 in | Wt 151.8 lb

## 2016-04-03 VITALS — BP 168/70 | HR 70 | Temp 97.8°F | Ht 61.0 in | Wt 151.6 lb

## 2016-04-03 DIAGNOSIS — I739 Peripheral vascular disease, unspecified: Secondary | ICD-10-CM

## 2016-04-03 DIAGNOSIS — J441 Chronic obstructive pulmonary disease with (acute) exacerbation: Secondary | ICD-10-CM

## 2016-04-03 DIAGNOSIS — I701 Atherosclerosis of renal artery: Secondary | ICD-10-CM

## 2016-04-03 DIAGNOSIS — I2510 Atherosclerotic heart disease of native coronary artery without angina pectoris: Secondary | ICD-10-CM | POA: Diagnosis not present

## 2016-04-03 DIAGNOSIS — IMO0002 Reserved for concepts with insufficient information to code with codable children: Secondary | ICD-10-CM

## 2016-04-03 DIAGNOSIS — I10 Essential (primary) hypertension: Secondary | ICD-10-CM

## 2016-04-03 DIAGNOSIS — Z9889 Other specified postprocedural states: Secondary | ICD-10-CM

## 2016-04-03 DIAGNOSIS — J449 Chronic obstructive pulmonary disease, unspecified: Secondary | ICD-10-CM | POA: Insufficient documentation

## 2016-04-03 MED ORDER — AMOXICILLIN-POT CLAVULANATE 400-57 MG/5ML PO SUSR: 500.0000 mg | Freq: Three times a day (TID) | ORAL | 0 refills | Status: AC

## 2016-04-03 MED ORDER — PREDNISONE 50 MG PO TABS: 50.0000 mg | ORAL_TABLET | Freq: Every day | ORAL | 0 refills | Status: AC

## 2016-04-03 NOTE — Patient Instructions (Signed)
Thank you so much for coming to visit today! We will treat you for a COPD exacerbation. Please take Prednisone 50mg  daily for 5 days. Please take Augmentin every 8hr for 7 days. Please return for your pneumonia shot. Please return to see Dr. Raymondo BandKoval here in our clinic for lung testing. Please go to your Cardiologist for high blood pressure today. Please return if your symptoms worsen or fail to resolve.  Dr. Caroleen Hammanumley

## 2016-04-03 NOTE — Progress Notes (Signed)
Cardiology Office Note   Date:  04/03/2016   ID:  AYDAN PHOENIX, DOB 09/13/1940, MRN [id number]  PCP:  Hilton Sinclair, MD  Cardiologist:  Dr Antoine Poche 08/2014 Theodore Demark, PA-C 05/2015  Chief Complaint  Patient presents with  . Follow-up    sob;frequently. sharp chest pain. lightheaded/dizziness; frequently, everyday. cramping in legs. edema; legs and ankles.     History of Present Illness: KALE DOLS is a 75 y.o. female with a history of cath 2010, LAD 40-50%, CFX 40%, RCA 40-50% w/ catheter spasm noted, EF > 60%, subclavian stent, right renal stent and hypertension  She has been coughing recently, and went to see her primary care M.D. today. She was started on antibiotics and also given a prescription for prednisone 50 mg 5 days. She is reluctant to take this as she feels she will have side effects from it.  [name] presents for evaluation and treatment of her cardiac issues.  She has problems with salt sensitivity. She has been trying very hard to maintain a low-sodium diet. When she does not maintain a very low sodium diet, she has problems with volume overload. She ate a [employer] sandwich the other day, and became edematous after that. She has been struggling to get the weight off since then, but feels that she has just about gotten it all off.  She takes her medications scattered throughout the day so that she does not get too much medication at one time. She takes the Diovan separately from the spironolactone, separately from the nevibolol. This works for her.  She has left rib pain in the area of her ribs where her underwire bra crosses. This area is tender to palpation. There has been no fall or injury. This is her only chest pain.  She feels her respiratory status is at baseline. Now that she has gotten rid of the extra fluid, her dyspnea on exertion is at baseline and she is not having lower extremity edema except for small amount during the day. She drinks 2  bottles of water a day and possibly a cup or to something else such as decaffeinated coffee. She works very hard to eating a low sodium diet and looks for frozen dinners that have 500 mg of sodium or less per serving.  She had some cramps in her legs the other day, these have resolved. At the time that she was having cramps, she was also dealing with the volume overloaded. This has improved.   Past Medical History:  Diagnosis Date  . Arthritis   . Asthma   . CAD (coronary artery disease) 08/2008   LAD 40-50%, CFX 40%, RCA 40-50% w/ catheter spasm, EF > 60%  . History of nuclear stress test 01/2015   No scar or ischemia, EF > 80%  . Hypertension   . PVD (peripheral vascular disease) (HCC)    left subclavian stent & right renal stent 2010, subclavian stent patent on Doppler 2015    Past Surgical History:  Procedure Laterality Date  . ABDOMINAL HYSTERECTOMY     for concern for CA  . ABDOMINAL SURGERY    . APPENDECTOMY    . CARDIAC CATHETERIZATION  09/17/2008   mild noncritical coronary artery disease-recommend medical therapy, high-grade left subclavian stenosis-recommend left subclavian percutaneous peripheral intervention (PPI). recommend doppler surveillance of renal arteries for mild left and moderate right renal artery stenosis  . CARDIOVASCULAR STRESS TEST  03/21/2012   normal pattern of perfusion in all regions,  no scintigraphic evidence of inducible myocardial ischemia, no EKG changes for ischemia.  Marland Kitchen. CAROTID DUPLEX  03/13/2013   bilateral ICAs demonstrated normal patency without evidence of a significant diameter reduction, moderate tortuosity noted throughout the left ICA with falsley elevated velocities in the mid sigment. left subclavian arterial stent demonstrated normal velocities without suggestion of a significant diameter reduction  . CHOLECYSTECTOMY    . DOPPLER ECHOCARDIOGRAPHY  03/21/2012   EF >55%, mild concentric LVH, LV systolic function is normal, there is aortic root  sclerosis/calcification  . SUBCLAVIAN VEIN ANGIOPLASTY / STENTING Left 09/28/2008   L Subclavian 70-80% stenosis-10.0x5620mm self-expanding stent post dilated with a 7.0x2120mm [employer] balloon. stenosis reduced from 70-80% to 0% residual. Stenting of R femoral artery 70% narrowing-5.0x12 Herculink stent deployed at 10atm and postdilated at 14atm resulting in reduction from 70% stenosis to 0% residual  . URETERAL STENT PLACEMENT      Current Outpatient Prescriptions  Medication Sig Dispense Refill  . amoxicillin-clavulanate (AUGMENTIN) 400-57 MG/5ML suspension Take 6.3 mLs (500 mg total) by mouth every 8 (eight) hours. 200 mL 0  . aspirin 81 MG tablet Take 81 mg by mouth daily.    . cetirizine (ZYRTEC ALLERGY) 10 MG tablet Take 1 tablet (10 mg total) by mouth daily. 30 tablet 3  . cholestyramine light (PREVALITE) 4 GM/DOSE powder Take by mouth 3 (three) times daily with meals.    . clotrimazole-betamethasone (LOTRISONE) cream     . DIOVAN 80 MG tablet TAKE 1 TABLET BY MOUTH DAILY 30 tablet 6  . diphenoxylate-atropine (LOMOTIL) 2.5-0.025 MG per tablet Take 1 tablet by mouth 4 (four) times daily as needed for diarrhea or loose stools. 30 tablet 0  . MOBIC 15 MG tablet Take 1 tablet (15 mg total) by mouth daily. 30 tablet 3  . mometasone-formoterol (DULERA) 100-5 MCG/ACT AERO Inhale 2 puffs into the lungs 2 (two) times daily.    . Multiple Vitamins-Minerals (CENTRUM SILVER ULTRA WOMENS PO) Take 1 tablet by mouth every morning.    . nebivolol (BYSTOLIC) 10 MG tablet Take 10 mg by mouth daily.    Marland Kitchen. NEXIUM 40 MG capsule TAKE ONE CAPSULE BY MOUTH EVERY DAY 90 capsule 2  . nitroGLYCERIN (NITROSTAT) 0.4 MG SL tablet PLACE 1 TABLET(0.4MG ) UNDER THE TONGUE EVERY 5 MINUTES AS NEEDED FOR CHEST PAIN 25 tablet 1  . phenazopyridine (PYRIDIUM) 200 MG tablet TAKE 1 TABLET BY MOUTH THREE TIMES DAILY AS NEEDED FOR PAIN 270 tablet 1  . predniSONE (DELTASONE) 50 MG tablet Take 1 tablet (50 mg total) by mouth daily with  breakfast. 5 tablet 0  . PROAIR HFA 108 (90 BASE) MCG/ACT inhaler Inhale 1 puff into the lungs daily as needed.  1  . SINGULAIR 10 MG tablet TAKE 1 TABLET BY MOUTH EVERY NIGHT AT BEDTIME 30 tablet 3  . traMADol (ULTRAM) 50 MG tablet TAKE 1 TABLET BY MOUTH TWICE DAILY AS NEEDED FOR PAIN 60 tablet 1  . zolpidem (AMBIEN CR) 12.5 MG CR tablet Take 1 tablet (12.5 mg total) by mouth at bedtime as needed for sleep. 15 tablet 3   No current facility-administered medications for this visit.     Allergies:   Codeine; Losartan; Budesonide-formoterol fumarate; Ciprofloxacin; Clarinex [desloratadine]; Neomycin; Polyethylene glycol; Tramadol; Hydrochlorothiazide; Meloxicam; Rosuvastatin; Sulfonamide derivatives; Nitrofurantoin; and Nitrofurantoin monohyd macro    Social History:  The patient  reports that she has quit smoking. She has never used smokeless tobacco. She reports that she does not drink alcohol.   Family History:  The patient's family history includes Cancer in her maternal grandmother; Heart attack in her maternal grandmother and mother.    ROS:  Please see the history of present illness. All other systems are reviewed and negative.    PHYSICAL EXAM: VS:  BP (!) 158/68   Pulse 70   Ht 5\' 1"  (1.549 m)   Wt 151 lb 12.8 oz (68.9 kg)   BMI 28.68 kg/m  , BMI Body mass index is 28.68 kg/m. GEN: Well nourished, well developed, female in no acute distress  HEENT: normal for age  Neck: no JVD, no carotid bruit, no masses Cardiac: RRR; Soft murmur, no rubs, or gallops Respiratory:  clear to auscultation bilaterally, normal work of breathing GI: soft, nontender, nondistended, + BS MS: no deformity or atrophy; trace edema; distal pulses are 2+ in 4/4 extremities   Skin: warm and dry, no rash Neuro:  Strength and sensation are intact Psych: euthymic mood, full affect   EKG:  EKG is ordered today. The ekg ordered today demonstrates Sinus rhythm with first-degree AV block, P-R interval 216  ms  ECHO: 08/2015 - Left ventricle: The cavity size was normal. Wall thickness was   increased in a pattern of mild LVH. Systolic function was normal.   The estimated ejection fraction was in the range of 60% to 65%.   Wall motion was normal; there were no regional wall motion   abnormalities. Doppler parameters are consistent with abnormal   left ventricular relaxation (grade 1 diastolic dysfunction). - Aortic valve: There was no stenosis. - Mitral valve: Mildly calcified annulus. Mildly calcified leaflets   . There was no significant regurgitation. - Right ventricle: The cavity size was normal. Systolic function   was normal. - Tricuspid valve: Peak RV-RA gradient (S): 29 mm Hg. - Pulmonary arteries: PA peak pressure: 32 mm Hg (S). - Inferior vena cava: The vessel was normal in size. The   respirophasic diameter changes were in the normal range (>= 50%),   consistent with normal central venous pressure. Impressions: - Normal LV size with mild LV hypertrophy. EF 60-65%. Normal RV   size and systolic function. No significant valvular   abnormalities.  CAROTID DOPPLERS 08/2015 40 - 59% bilateral stenosis. Follow up in one year.  RENAL DOPPLERS 08/2015  Right renal stent is patent. No other abnormalities.   MYOVIEW 06/2015  Nuclear stress EF: 69%.  The left ventricular ejection fraction is hyperdynamic (>65%).  The study is normal.  This is a low risk study.  1. Low risk study 2. Nl perfusion and EF  Recent Labs: 03/10/2016: Hemoglobin 9.6    Lipid Panel    Component Value Date/Time   CHOL 166 04/23/2008 2300   TRIG 90 04/23/2008 2300   HDL 64 04/23/2008 2300   CHOLHDL 2.6 Ratio 04/23/2008 2300   VLDL 18 04/23/2008 2300   LDLCALC 84 04/23/2008 2300     Wt Readings from Last 3 Encounters:  04/03/16 151 lb 12.8 oz (68.9 kg)  04/03/16 151 lb 9.6 oz (68.8 kg)  03/10/16 153 lb 9.6 oz (69.7 kg)     Other studies Reviewed: Additional studies/ records that  were reviewed today include: Office notes, hospital records and testing.  ASSESSMENT AND PLAN:  1.  Moderate CAD/PAD: Cardiac risk factor management with a heart healthy diet, good blood pressure control and keep her LDL down is recommended. Her current chest pain is atypical, and no additional cardiac testing is indicated at this time.  She can get repeat carotid  Dopplers prior to her next appointment with Dr. Antoine Poche or myself. Repeat renal artery Dopplers are per M.D.  2. Hypertension: She is specific in that her blood pressure does not run over 132 systolic at home. She is compliant with her medications, tolerating them better because they are spaced apart. At this time, no additional medication is needed. She may have "whitecoat syndrome" so I will make no changes at this time. Continue current therapy.  3. Upper respiratory illness: She is afraid to take the prednisone prescribed her current treatment. She is worried that it will run up her blood pressure and heart rate. She is encouraged to follow-up with primary care for this, but was also advised that the steroids are mainly to treat symptoms, and if she doesn't feel like she needs them, that is okay.   Current medicines are reviewed at length with the patient today.  The patient has concerns regarding medicines. Concerns were addressed  The following changes have been made:  Okay to hold prednisone if she is not wheezing and does not feel comfortable taking. No change in cardiac meds  Labs/ tests ordered today include:  No orders of the defined types were placed in this encounter.    Disposition:   FU with Dr. Antoine Poche or myself  Signed, Leanna Battles  04/03/2016 10:04 AM    [facility] Phone: [phone]; Fax: [phone]  This note was written with the assistance of speech recognition software. Please excuse any transcriptional errors.

## 2016-04-03 NOTE — Patient Instructions (Addendum)
Medication Instructions:  Continue current medications   Labwork: None Ordered  Testing/Procedures: None Ordered  Follow-Up: Your physician wants you to follow-up in: 1 year with [name]/Dr Hochrein. You will receive a reminder letter in the mail two months in advance. If you don't receive a letter, please call our office to schedule the follow-up appointment.   Any Other Special Instructions Will Be Listed Below (If Applicable).   If you need a refill on your cardiac medications before your next appointment, please call your pharmacy.

## 2016-07-14 ENCOUNTER — Other Ambulatory Visit: Payer: Self-pay | Admitting: Cardiology

## 2016-07-14 MED ORDER — NEBIVOLOL HCL 10 MG PO TABS: 10.0000 mg | ORAL_TABLET | Freq: Every day | ORAL | 5 refills | Status: DC

## 2016-07-14 NOTE — Telephone Encounter (Signed)
Rx(s) sent to pharmacy electronically.  

## 2016-07-14 NOTE — Telephone Encounter (Signed)
She need a new prescription for Bystolic 10mg . Her family practice doctor was writing it,she was living down there.Please call this to [facility] on [address] [facility] [address],[place]

## 2016-09-07 ENCOUNTER — Ambulatory Visit (INDEPENDENT_AMBULATORY_CARE_PROVIDER_SITE_OTHER): Payer: Medicare Other | Admitting: Family Medicine

## 2016-09-07 VITALS — BP 160/72 | HR 80 | Temp 97.5°F | Ht 61.0 in | Wt 150.2 lb

## 2016-09-07 DIAGNOSIS — K580 Irritable bowel syndrome with diarrhea: Secondary | ICD-10-CM | POA: Diagnosis not present

## 2016-09-07 MED ORDER — FLUCONAZOLE 100 MG PO TABS: 100.0000 mg | ORAL_TABLET | Freq: Every day | ORAL | 0 refills | Status: DC

## 2016-09-07 NOTE — Patient Instructions (Signed)
Thank you so much for coming to visit today! I have sent a prescription for Diflucan 100mg  to the pharmacy. I recommend taking one tablet today and then one more in 2-3 days if no improvement. Please follow up with your GI doctor as scheduled. Please return to see your PCP if no improvement.  Dr. Caroleen Hammanumley

## 2016-09-07 NOTE — Progress Notes (Signed)
Subjective:     Patient ID: [name], female   DOB: [birth year], 76 y.o.   MRN: [id number]  HPI [name] is a 76yo female presenting today for diarrhea and abdominal discomfort.  Long history of abdominal discomfort with previous diagnoses including Inflammatory Bowel Disease requiring partial colectomy in 2007 and Irritable Bowel Syndrome. States this is consistent with her previous IBS flares given 1-1.6631month history of diarrhea and abdominal discomfort.  Se[facility] and summary of records brought to today's visit below:  11/2014: Colonoscopy with normal biopsies. Celiac blood test normal.   08/22/16: Fecal Lactoferrin negative  C. Difficile negative  Ova and Parasite screen negative  GI Panel negative  Iron Panel normal  Ferritin increased to 683.2.   CBC with Hemoglobin 10.6, WBC 8.1, MCV normal at 93  Reticulocyte Count increased to 2.4%  Vitamin B12 normal  08/28/16: CT abdomen and pelvis with contrast showed mild distal wall colonic wall thickening indicating possible colitis, diveriticula without signs of diverticulitis.   Reports she was also evaluated by GI last week. Was given course of Flagyl and Ciprofloxacin--states she has been taking the Flagyl as prescribed, but did not initiate Ciprofloxacin since she was told to not take it by Cardiology and it was noted to worsen her atrial fibrillation in the past. Has follow up with GI in April and was told they may repeat the CT scan at that time. Continues to describe stool as "soft and pasty" today. Denies blood. Denies fever. Continues to note left lower quadrant pain, but states it is the same pain she always feels. Notes her pain and diarrhea in the past has always resolved with a 30day course of Diflucan 100mg  daily--chart review confirms this and also notes attempts at Probiotics and Loperamide in the past without improvement. Former Smoker  Review of Systems Per HPI    Objective:   Physical Exam  Constitutional: She appears well-developed and well-nourished. No distress.  Cardiovascular: Normal rate.   No murmur heard. Pulmonary/Chest: Effort normal. No respiratory distress. She has no wheezes.  Abdominal: Soft. She exhibits no distension.  Mild tenderness noted in left lower quadrant. Surgical scars noted secondary to partial colectomy, open cholecystectomy, and open appendectomy. Negative Rebound. Negative Rovsing.   Psychiatric: She has a normal mood and affect. Her behavior is normal.      Assessment and Plan:     1. Irritable bowel syndrome with diarrhea Differential includes diverticulitis, however recent CT did not show signs of diverticulitis and pain is unchanged. Given success of previous courses of Diflucan, prescription given for 100mg  daily x30 days. Continue Flagyl as prescribed by GI. Follow up with GI as scheduled. Follow up with PCP.

## 2016-09-14 ENCOUNTER — Other Ambulatory Visit: Payer: Self-pay | Admitting: Family Medicine

## 2016-09-29 ENCOUNTER — Telehealth: Payer: Self-pay | Admitting: Internal Medicine

## 2016-09-29 ENCOUNTER — Other Ambulatory Visit: Payer: Self-pay | Admitting: Internal Medicine

## 2016-09-29 MED ORDER — FLUCONAZOLE 100 MG PO TABS: 100.0000 mg | ORAL_TABLET | Freq: Every day | ORAL | 0 refills | Status: DC

## 2016-09-29 NOTE — Telephone Encounter (Signed)
Patient is aware that script was sent to pharmacy. Briannah Lona,CMA  

## 2016-09-29 NOTE — Telephone Encounter (Signed)
Medication refilled

## 2016-09-29 NOTE — Telephone Encounter (Signed)
Pt called and would like a refill on her Diflucan. jw

## 2016-11-02 ENCOUNTER — Telehealth: Payer: Self-pay | Admitting: Cardiology

## 2016-11-02 NOTE — Telephone Encounter (Signed)
New Message   Pt would like to have doppler and carotid done and wants to know when she should come in for those. I advised her that I needed orders and requests a call back.

## 2016-11-02 NOTE — Telephone Encounter (Signed)
I have not seen her in a couple of years.  I would like to see her back before ordering any studies.

## 2016-11-02 NOTE — Telephone Encounter (Signed)
Spoke with pt she states that it is time for her to have her annual carotid US and echo, ok to order, or do you want to see her first? Last test was done 08-2015, and last OV was 03-2016([facility]) to F?U in one year. Please advise

## 2016-11-03 NOTE — Telephone Encounter (Signed)
Leave message for pt to call back 

## 2016-11-06 NOTE — Telephone Encounter (Signed)
Returning your call from Friday. °

## 2016-11-07 NOTE — Telephone Encounter (Signed)
Pt have appt on Thursday June 14th @ 9:00 am

## 2016-11-09 ENCOUNTER — Encounter: Payer: Self-pay | Admitting: Internal Medicine

## 2016-11-09 ENCOUNTER — Ambulatory Visit (INDEPENDENT_AMBULATORY_CARE_PROVIDER_SITE_OTHER): Payer: Medicare Other | Admitting: Internal Medicine

## 2016-11-09 ENCOUNTER — Ambulatory Visit (INDEPENDENT_AMBULATORY_CARE_PROVIDER_SITE_OTHER): Payer: Medicare Other | Admitting: Pharmacist

## 2016-11-09 ENCOUNTER — Encounter: Payer: Self-pay | Admitting: Pharmacist

## 2016-11-09 ENCOUNTER — Other Ambulatory Visit: Payer: Self-pay | Admitting: Internal Medicine

## 2016-11-09 ENCOUNTER — Telehealth: Payer: Self-pay | Admitting: *Deleted

## 2016-11-09 VITALS — BP 138/76 | HR 74 | Ht 61.0 in | Wt 148.0 lb

## 2016-11-09 DIAGNOSIS — Z87891 Personal history of nicotine dependence: Secondary | ICD-10-CM | POA: Insufficient documentation

## 2016-11-09 DIAGNOSIS — D508 Other iron deficiency anemias: Secondary | ICD-10-CM | POA: Diagnosis not present

## 2016-11-09 MED ORDER — MOMETASONE FURO-FORMOTEROL FUM 100-5 MCG/ACT IN AERO: 2.0000 | INHALATION_SPRAY | Freq: Two times a day (BID) | RESPIRATORY_TRACT | 0 refills | Status: DC

## 2016-11-09 NOTE — Patient Instructions (Addendum)
It was so nice to see you!  We will go ahead and check some blood levels again today. I will call you with these results.  Please make sure you follow-up with the cardiologist.   -Dr. Nancy MarusMayo

## 2016-11-09 NOTE — Progress Notes (Signed)
   S:  Patient arrives ambulating without assistance in bright spirits.   Patient arrives for evaluation/assistance with lab results and a medication review, primarily concerned with Hgb and iron lab results.  Patient was last seen by Primary Care Provider on 09/07/16.  Patient reports feeling tired after working the past 4 days. Is still smoke free. Patient's IBS is currently controlled, with no recent episodes of diarrhea reported. Patient also needed refill of Dulera, likely to require prior auth paperwork. Vitals:   11/09/16 0944  BP: (!) 152/70  Pulse: 74    A/P: Provided comprehensive medication review. Updated allergies and reconciled and clarified allergies.  Lab results brought by the patient were normal with exception of low Hgb. [facility] submitted by Dr. Nancy MarusMayo and will attempt to get a prior authorization.   Past history of tobacco abuse, patient has been smoke free for the past 20 years. Encouraged continued abstinence.   Written information provided. Total time in face-to-face counseling 60 minutes.  Patient seen with Emeline GeneralWhitney Schlick, PharmD Candidate.

## 2016-11-09 NOTE — Patient Instructions (Signed)
No changes today

## 2016-11-09 NOTE — Progress Notes (Signed)
   [facility] Phone: [phone]  Subjective:  [name] is a 76 year old female presenting to clinic for follow-up of her anemia. She has had iron deficiency anemia for years. She has chronic diarrhea and irritable bowel syndrome. She was seen by a hematologist last year and was given IV iron x 2. She had labs rechecked in 07/2016 and Hgb was 10.6, iron 104, ferritin 683, reticulocytes 2.4%. She has been on oral iron supplementation in the past too, but this was stopped because she was told that she was not absorbing this well. Patient has been trying to eat foods that are higher in iron recently. She thinks this has been helping. She denies any hematochezia, melena, or vaginal bleeding. No bleeding from any other sites. She endorses some dizziness with standing, fatigue, and occasional palpitations. No chest pain.  ROS: See HPI for pertinent positives and negatives  Past Medical History- HTN, CAD, COPD, allergic rhinitis, HLD, depression, GERD, irritable bowel syndrome  Family history reviewed for today's visit. No changes.  Social history- patient is a former smoker  Objective: BP 138/76 (BP Location: Left Arm)   Pulse 74   Ht 5\' 1"  (1.549 m)   Wt 148 lb (67.1 kg)   BMI 27.96 kg/m  Gen: NAD, alert, cooperative with exam HEENT: NCAT, EOMI, MMM Neck: FROM, supple CV: RRR, no murmur Resp: CTABL, no wheezes, normal work of breathing  Assessment/Plan: Iron Deficiency Anemia: Has been going on for years. Saw a hematologist recently and was given IV iron x 2, which improved her iron levels. Had labs done 07/2016 that showed Hgb 10.6, iron 104, ferritin 683, reticulocytes 2.4%, folate normal, B12 normal. Not currently on any iron supplementation, but is trying to eat iron-rich foods. - Will recheck CBC, iron, TIBC, ferritin, and reticulocytes. - May need to start ferrous sulfate vs refer back to hematologist for additional doses of IV iron.   Megan CarolKaty Lorraine Terriquez, MD PGY-2

## 2016-11-09 NOTE — Assessment & Plan Note (Signed)
Has been going on for years. Saw a hematologist recently and was given IV iron x 2, which improved her iron levels. Had labs done 07/2016 that showed Hgb 10.6, iron 104, ferritin 683, reticulocytes 2.4%, folate normal, B12 normal. Not currently on any iron supplementation, but is trying to eat iron-rich foods. - Will recheck CBC, iron, TIBC, ferritin, and reticulocytes. - May need to start ferrous sulfate vs refer back to hematologist for additional doses of IV iron.

## 2016-11-09 NOTE — Progress Notes (Signed)
Patient ID: Megan ConnorsBetty J [name], female   DOB: 18-Aug-1940, 76 y.o.   MRN: [id number] Reviewed: Agree with Dr. Macky LowerKoval's documentation and management.

## 2016-11-09 NOTE — Assessment & Plan Note (Signed)
Past history of tobacco abuse, patient has been smoke free for the past 20 years. Encouraged continued abstinence.

## 2016-11-09 NOTE — Telephone Encounter (Signed)
[facility]. PA completed online at www.[email or web address].  PA was approved via [employer]; valid dates: 08/11/16-11/09/17.  Clovis PuMartin, Jessicamarie Amiri L, RN

## 2016-11-10 ENCOUNTER — Ambulatory Visit: Payer: Medicare Other | Admitting: Pharmacist

## 2016-11-10 ENCOUNTER — Encounter: Payer: Self-pay | Admitting: Internal Medicine

## 2016-11-10 LAB — CBC
HEMOGLOBIN: 10.6 g/dL — AB (ref 11.1–15.9)
Hematocrit: 31.5 % — ABNORMAL LOW (ref 34.0–46.6)
MCH: 31.9 pg (ref 26.6–33.0)
MCHC: 33.7 g/dL (ref 31.5–35.7)
MCV: 95 fL (ref 79–97)
PLATELETS: 281 10*3/uL (ref 150–379)
RBC: 3.32 x10E6/uL — AB (ref 3.77–5.28)
RDW: 13.3 % (ref 12.3–15.4)
WBC: 6.5 10*3/uL (ref 3.4–10.8)

## 2016-11-10 LAB — IRON AND TIBC
IRON SATURATION: 28 % (ref 15–55)
IRON: 86 ug/dL (ref 27–139)
Total Iron Binding Capacity: 309 ug/dL (ref 250–450)
UIBC: 223 ug/dL (ref 118–369)

## 2016-11-10 LAB — FERRITIN: FERRITIN: 806 ng/mL — AB (ref 15–150)

## 2016-11-10 LAB — RETICULOCYTES: Retic Ct Pct: 1.9 % (ref 0.6–2.6)

## 2016-11-13 ENCOUNTER — Other Ambulatory Visit: Payer: Self-pay | Admitting: *Deleted

## 2016-11-14 MED ORDER — FLUTICASONE PROPIONATE 50 MCG/ACT NA SUSP: 1.0000 | Freq: Two times a day (BID) | NASAL | 1 refills | Status: DC

## 2016-11-29 ENCOUNTER — Other Ambulatory Visit: Payer: Self-pay | Admitting: Internal Medicine

## 2016-11-29 MED ORDER — FLUCONAZOLE 100 MG PO TABS: 100.0000 mg | ORAL_TABLET | Freq: Every day | ORAL | 0 refills | Status: DC

## 2016-11-29 NOTE — Telephone Encounter (Signed)
Pt states she has diarrhea again. It was caused by stress, pt had to take care of a 257 week old puppy and that was just too much stress and made her sick. Pt would like PCP to call in Flagyl 300mg  twice a day and diflucan. Pt uses [facility] on [address]. ep

## 2016-11-29 NOTE — Telephone Encounter (Signed)
Do not think patient would benefit from Flagyl. Taking a lot of antibiotics can cause a lot of long term problems like making diarrhea worse and leading to other infections that are difficult to treat. Also, if we give her two medications at the same time, we cannot be sure which one helped. I would be willing to try a 14 day course of Diflucan to see if this helps. Patient should come in for an appointment if her diarrhea has not improved after the Diflucan.

## 2016-11-29 NOTE — Telephone Encounter (Signed)
Will forward to MD to advise. Jazmin Hartsell,CMA  

## 2016-11-30 ENCOUNTER — Telehealth: Payer: Self-pay | Admitting: Internal Medicine

## 2016-11-30 ENCOUNTER — Other Ambulatory Visit: Payer: Self-pay | Admitting: Internal Medicine

## 2016-11-30 MED ORDER — METRONIDAZOLE 250 MG PO TABS: 250.0000 mg | ORAL_TABLET | Freq: Two times a day (BID) | ORAL | 0 refills | Status: DC

## 2016-11-30 NOTE — Telephone Encounter (Signed)
Pt called and would like to speak to Dr. Raymondo BandKoval. She has some issues with medication and understanding of what helps and what doesn't help. Please call. jw

## 2016-11-30 NOTE — Telephone Encounter (Signed)
Called patient on the phone and had a long discussion regarding her diarrhea. She states she does not want to change providers and she was just upset on the phone and said things she didn't mean. Refills provided for Flagyl and Diflucan, as she states that this is the only thing that has ever helped her in the past.

## 2016-11-30 NOTE — Telephone Encounter (Signed)
Pt states she is very upset and feels like PCP is not listening to her because this is the only thing that works. Pt states she does not want to see Dr. Nancy MarusMayo anymore. ep

## 2016-11-30 NOTE — Telephone Encounter (Signed)
Will forward to MD to make aware. Cherice Glennie,CMA  

## 2016-12-04 ENCOUNTER — Telehealth: Payer: Self-pay | Admitting: Cardiology

## 2016-12-04 NOTE — Telephone Encounter (Signed)
OK with me.

## 2016-12-04 NOTE — Telephone Encounter (Signed)
Returned the call to the patient. She would like to start seeing Dr. Royann Shiversroitoru instead of Dr. Antoine PocheHochrein since that is who Dr. Alanda AmassWeintraub referred her to see. Will route to the providers for further recommendation.

## 2016-12-04 NOTE — Telephone Encounter (Signed)
New message    Patient would like to switch over to Dr. Royann Shiversroitoru from Dr. Frank[place] LionsHochrien.

## 2016-12-04 NOTE — Progress Notes (Deleted)
Cardiology Office Note   Date:  12/04/2016   ID:  [name], DOB 1940/09/06, MRN [id number]  PCP:  Campbell StallMayo, Katy Dodd, MD  Cardiologist:   Rollene RotundaJames Dyami Umbach, MD  Referring:  ***  No chief complaint on file.     History of Present Illness: Megan ConnorsBetty J [name] is a 76 y.o. female who presents for ***     Past Medical History:  Diagnosis Date  . Arthritis   . Asthma   . CAD (coronary artery disease) 08/2008   LAD 40-50%, CFX 40%, RCA 40-50% w/ catheter spasm, EF > 60%  . History of nuclear stress test 01/2015   No scar or ischemia, EF > 80%  . Hypertension   . PVD (peripheral vascular disease) (HCC)    left subclavian stent & right renal stent 2010, subclavian stent patent on Doppler 2015    Past Surgical History:  Procedure Laterality Date  . ABDOMINAL HYSTERECTOMY     for concern for CA  . ABDOMINAL SURGERY    . APPENDECTOMY    . CARDIAC CATHETERIZATION  09/17/2008   mild noncritical coronary artery disease-recommend medical therapy, high-grade left subclavian stenosis-recommend left subclavian percutaneous peripheral intervention (PPI). recommend doppler surveillance of renal arteries for mild left and moderate right renal artery stenosis  . CARDIOVASCULAR STRESS TEST  03/21/2012   normal pattern of perfusion in all regions, no scintigraphic evidence of inducible myocardial ischemia, no EKG changes for ischemia.  Marland Kitchen. CAROTID DUPLEX  03/13/2013   bilateral ICAs demonstrated normal patency without evidence of a significant diameter reduction, moderate tortuosity noted throughout the left ICA with falsley elevated velocities in the mid sigment. left subclavian arterial stent demonstrated normal velocities without suggestion of a significant diameter reduction  . CHOLECYSTECTOMY    . DOPPLER ECHOCARDIOGRAPHY  03/21/2012   EF >55%, mild concentric LVH, LV systolic function is normal, there is aortic root sclerosis/calcification  . SUBCLAVIAN VEIN ANGIOPLASTY / STENTING Left 09/28/2008   L  Subclavian 70-80% stenosis-10.0x5720mm self-expanding stent post dilated with a 7.0x6720mm [employer] balloon. stenosis reduced from 70-80% to 0% residual. Stenting of R femoral artery 70% narrowing-5.0x12 Herculink stent deployed at 10atm and postdilated at 14atm resulting in reduction from 70% stenosis to 0% residual  . URETERAL STENT PLACEMENT       Current Outpatient Prescriptions  Medication Sig Dispense Refill  . aspirin 81 MG tablet Take 81 mg by mouth daily.    . cetirizine (ZYRTEC ALLERGY) 10 MG tablet Take 1 tablet (10 mg total) by mouth daily. 30 tablet 3  . Cholecalciferol 2000 units CAPS Take 1 capsule by mouth daily.    . cholestyramine light (PREVALITE) 4 GM/DOSE powder Take by mouth 3 (three) times daily with meals.    . clotrimazole-betamethasone (LOTRISONE) cream     . DIOVAN 80 MG tablet TAKE 1 TABLET BY MOUTH DAILY 30 tablet 6  . diphenoxylate-atropine (LOMOTIL) 2.5-0.025 MG per tablet Take 1 tablet by mouth 4 (four) times daily as needed for diarrhea or loose stools. 30 tablet 0  . DULERA 100-5 MCG/ACT AERO INHALE 2 PUFFS INTO THE LUNGS TWICE DAILY 39 g 0  . fluconazole (DIFLUCAN) 100 MG tablet Take 1 tablet (100 mg total) by mouth daily. 14 tablet 0  . fluticasone (FLONASE) 50 MCG/ACT nasal spray Place 1 spray into both nostrils 2 (two) times daily. 16 g 1  . hydrOXYzine (ATARAX/VISTARIL) 25 MG tablet Take 0.5-1 tablets by mouth 2 (two) times daily.    . metroNIDAZOLE (FLAGYL) 250  MG tablet Take 1 tablet (250 mg total) by mouth 2 (two) times daily. 30 tablet 0  . MOBIC 15 MG tablet Take 1 tablet (15 mg total) by mouth daily. 30 tablet 3  . Multiple Vitamins-Minerals (CENTRUM SILVER ULTRA WOMENS PO) Take 1 tablet by mouth every morning.    . nebivolol (BYSTOLIC) 10 MG tablet Take 1 tablet (10 mg total) by mouth daily. 30 tablet 5  . NEXIUM 40 MG capsule TAKE ONE CAPSULE BY MOUTH EVERY DAY 90 capsule 2  . nitroGLYCERIN (NITROSTAT) 0.4 MG SL tablet PLACE 1 TABLET(0.4MG ) UNDER THE  TONGUE EVERY 5 MINUTES AS NEEDED FOR CHEST PAIN (Patient not taking: Reported on 11/09/2016) 25 tablet 1  . phenazopyridine (PYRIDIUM) 200 MG tablet TAKE 1 TABLET BY MOUTH THREE TIMES DAILY AS NEEDED FOR PAIN (Patient not taking: Reported on 11/09/2016) 270 tablet 1  . PROAIR HFA 108 (90 BASE) MCG/ACT inhaler Inhale 1 puff into the lungs daily as needed.  1  . promethazine (PHENERGAN) 12.5 MG tablet Take 12.5 mg by mouth daily as needed.    Marland Kitchen SINGULAIR 10 MG tablet TAKE 1 TABLET BY MOUTH EVERY NIGHT AT BEDTIME 30 tablet 3  . spironolactone (ALDACTONE) 25 MG tablet Take 25 mg by mouth daily.    . traMADol (ULTRAM) 50 MG tablet TAKE 1 TABLET BY MOUTH TWICE DAILY AS NEEDED FOR PAIN 60 tablet 1  . zolpidem (AMBIEN CR) 12.5 MG CR tablet Take 1 tablet (12.5 mg total) by mouth at bedtime as needed for sleep. 15 tablet 3   No current facility-administered medications for this visit.     Allergies:   Budesonide-formoterol fumarate; Codeine; Losartan; Sulfonamide derivatives; Ciprofloxacin; Clarinex [desloratadine]; Hydrochlorothiazide; Neomycin; Polyethylene glycol; Rosuvastatin; Tramadol; Meloxicam; and Nitrofurantoin monohyd macro    ROS:  Please see the history of present illness.   Otherwise, review of systems are positive for {NONE DEFAULTED:18576::"none"}.   All other systems are reviewed and negative.    PHYSICAL EXAM: VS:  There were no vitals taken for this visit. , BMI There is no height or weight on file to calculate BMI. GENERAL:  Well appearing HEENT:  Pupils equal round and reactive, fundi not visualized, oral mucosa unremarkable NECK:  No jugular venous distention, waveform within normal limits, carotid upstroke brisk and symmetric, no bruits, no thyromegaly LYMPHATICS:  No cervical, inguinal adenopathy LUNGS:  Clear to auscultation bilaterally BACK:  No CVA tenderness CHEST:  Unremarkable HEART:  PMI not displaced or sustained,S1 and S2 within normal limits, no S3, no S4, no clicks,  no rubs, *** murmurs ABD:  Flat, positive bowel sounds normal in frequency in pitch, no bruits, no rebound, no guarding, no midline pulsatile mass, no hepatomegaly, no splenomegaly EXT:  2 plus pulses throughout, no edema, no cyanosis no clubbing SKIN:  No rashes no nodules NEURO:  Cranial nerves II through XII grossly intact, motor grossly intact throughout PSYCH:  Cognitively intact, oriented to person place and time    EKG:  EKG {ACTION; IS/IS ZOX:09604540} ordered today. The ekg ordered today demonstrates ***   Recent Labs: 11/09/2016: Hemoglobin 10.6; Platelets 281    Lipid Panel    Component Value Date/Time   CHOL 166 04/23/2008 2300   TRIG 90 04/23/2008 2300   HDL 64 04/23/2008 2300   CHOLHDL 2.6 Ratio 04/23/2008 2300   VLDL 18 04/23/2008 2300   LDLCALC 84 04/23/2008 2300      Wt Readings from Last 3 Encounters:  11/09/16 148 lb (67.1 kg)  11/09/16 148  lb 3.2 oz (67.2 kg)  09/07/16 150 lb 3.2 oz (68.1 kg)      Other studies Reviewed: Additional studies/ records that were reviewed today include: ***. Review of the above records demonstrates:  Please see elsewhere in the note.  ***   ASSESSMENT AND PLAN:  CAD:  ***  PAD:  ***  HTN:  ***   Current medicines are reviewed at length with the patient today.  The patient {ACTIONS; HAS/DOES NOT HAVE:19233} concerns regarding medicines.  The following changes have been made:  {PLAN; NO CHANGE:13088:s}  Labs/ tests ordered today include: *** No orders of the defined types were placed in this encounter.    Disposition:   FU with ***    Signed, Rollene Rotunda, MD  12/04/2016 7:56 AM    [facility]

## 2016-12-07 ENCOUNTER — Ambulatory Visit: Payer: Medicare Other | Admitting: Cardiology

## 2016-12-11 ENCOUNTER — Other Ambulatory Visit: Payer: Self-pay | Admitting: *Deleted

## 2016-12-11 MED ORDER — MOMETASONE FURO-FORMOTEROL FUM 100-5 MCG/ACT IN AERO: INHALATION_SPRAY | RESPIRATORY_TRACT | 0 refills | Status: DC

## 2016-12-13 ENCOUNTER — Telehealth: Payer: Self-pay | Admitting: Internal Medicine

## 2016-12-13 NOTE — Telephone Encounter (Signed)
Pt called and needs a refill on her Diflucan. She didn't get to finish this since she lost the bottle. Please call in. [facility] [place] [facility] [place]  Please call

## 2016-12-13 NOTE — Telephone Encounter (Signed)
Will forward to MD to advise. Jazmin Hartsell,CMA  

## 2016-12-14 NOTE — Telephone Encounter (Signed)
Pt asked for this to be sent to dr Caroleen Hammanrumley.

## 2016-12-14 NOTE — Telephone Encounter (Signed)
Per note this is for Dr. Caroleen Hammanumley.

## 2016-12-14 NOTE — Telephone Encounter (Signed)
Pt is asking for a months supply of diflucan.

## 2016-12-15 ENCOUNTER — Telehealth: Payer: Self-pay | Admitting: Cardiology

## 2016-12-15 NOTE — Telephone Encounter (Signed)
F/U call: Patient calling states that she needs a renal test, states that she was getting it every six months and hasn't had one since march of 2017.

## 2016-12-15 NOTE — Telephone Encounter (Signed)
Patient calling, states that she would like to have our office process an prior-authorization for her Diovan 80 mg medication. Patient will run out medication this weekend.

## 2016-12-18 ENCOUNTER — Telehealth: Payer: Self-pay | Admitting: Cardiology

## 2016-12-18 ENCOUNTER — Other Ambulatory Visit: Payer: Self-pay | Admitting: *Deleted

## 2016-12-18 MED ORDER — FLUTICASONE PROPIONATE 50 MCG/ACT NA SUSP: 1.0000 | Freq: Two times a day (BID) | NASAL | 5 refills | Status: DC

## 2016-12-18 NOTE — Telephone Encounter (Signed)
Returned call to patient She asks what all she is allergic to - she states her family MD in [place] needs to know  -- printed med + allergy list and left for patient to pick up She needs a prior authorization for brand name Diovan (she states she cannot take generic)  -- thru [employer] [place]

## 2016-12-18 NOTE — Telephone Encounter (Signed)
Refill for 90 day supply.  Shekera Beavers L, RN  

## 2016-12-18 NOTE — Telephone Encounter (Signed)
Please call,,concerning her Diovan

## 2016-12-20 ENCOUNTER — Encounter: Payer: Self-pay | Admitting: Family Medicine

## 2016-12-20 ENCOUNTER — Ambulatory Visit (INDEPENDENT_AMBULATORY_CARE_PROVIDER_SITE_OTHER): Payer: Medicare Other | Admitting: Family Medicine

## 2016-12-20 ENCOUNTER — Ambulatory Visit: Payer: Medicare Other | Admitting: Student

## 2016-12-20 VITALS — BP 105/70 | HR 75 | Temp 98.2°F | Ht 61.0 in | Wt 144.2 lb

## 2016-12-20 DIAGNOSIS — R197 Diarrhea, unspecified: Secondary | ICD-10-CM | POA: Diagnosis present

## 2016-12-20 DIAGNOSIS — R6889 Other general symptoms and signs: Secondary | ICD-10-CM

## 2016-12-20 DIAGNOSIS — D649 Anemia, unspecified: Secondary | ICD-10-CM

## 2016-12-20 MED ORDER — METRONIDAZOLE 250 MG PO TABS: 250.0000 mg | ORAL_TABLET | Freq: Two times a day (BID) | ORAL | 0 refills | Status: DC

## 2016-12-20 MED ORDER — FLUCONAZOLE 100 MG PO TABS: 100.0000 mg | ORAL_TABLET | Freq: Every day | ORAL | 0 refills | Status: DC

## 2016-12-20 MED ORDER — PROMETHAZINE HCL 12.5 MG PO TABS
12.5000 mg | ORAL_TABLET | Freq: Three times a day (TID) | ORAL | 0 refills | Status: DC | PRN
Start: 2016-12-20 — End: 2018-02-07

## 2016-12-20 NOTE — Progress Notes (Signed)
Subjective:     Patient ID: [name], female   DOB: 09-13-1940, 76 y.o.   MRN: [id number]  HPI [name] is a 76 year old female presenting today for diarrhea and upset stomach. Reports 4 week history of diarrhea with upset stomach. Describes diarrheas has both loose and frequent stools. Brought bag with splattered stool on it as an example. Denies fever. Follows with GI. Requests refill of Diflucan, Phenergan, Flagyl. Also request vitamin B12 shot as well as oral nystatin. Reports worsening of diarrhea when she is not on these medications. Please see my note from March 2018 for history of her abdominal symptoms. Former smoker.  Review of Systems Per history of present illness    Objective:   Physical Exam  Constitutional: She appears well-developed and well-nourished. No distress.  Cardiovascular: Normal rate and regular rhythm.   No murmur heard. Pulmonary/Chest: Effort normal. No respiratory distress. She has no wheezes.  Abdominal: Soft. Bowel sounds are normal. She exhibits no distension.  Tenderness noted at the bellybutton. Scars from cholecystectomy, appendectomy, and colectomy noted.  Skin: No rash noted.  Psychiatric: She has a normal mood and affect. Her behavior is normal.       Assessment and Plan:     1. Diarrhea, unspecified type Diflucan, Flagyl, and Phenergan refilled. Recommend following up with GI for further refills. Will check CMP today.  2. Anemia, unspecified type History of anemia with last hemoglobin 10.6. Will check CBC and vitamin B12 level today. Requests vitamin B12 injections today, but we will wait for level to return-given injection if low, but discussed injection is not recommended if level is normal.

## 2016-12-20 NOTE — Patient Instructions (Signed)
Thank you so much for coming to visit today! We will check several labs today. I have refilled your Diflucan, Flagyl, and Phenergan. Please follow up with your GI doctor.  Dr. Caroleen Hammanumley

## 2016-12-21 ENCOUNTER — Encounter (HOSPITAL_COMMUNITY): Payer: Self-pay

## 2016-12-21 ENCOUNTER — Telehealth: Payer: Self-pay | Admitting: *Deleted

## 2016-12-21 ENCOUNTER — Telehealth: Payer: Self-pay | Admitting: Family Medicine

## 2016-12-21 ENCOUNTER — Inpatient Hospital Stay (HOSPITAL_COMMUNITY)
Admission: EM | Admit: 2016-12-21 | Discharge: 2016-12-23 | DRG: 641 | Disposition: A | Discharge status: Home / Self Care | Payer: Medicare Other | Attending: Family Medicine | Admitting: Family Medicine

## 2016-12-21 DIAGNOSIS — I110 Hypertensive heart disease with heart failure: Secondary | ICD-10-CM | POA: Diagnosis present

## 2016-12-21 DIAGNOSIS — Z87891 Personal history of nicotine dependence: Secondary | ICD-10-CM

## 2016-12-21 DIAGNOSIS — Z888 Allergy status to other drugs, medicaments and biological substances status: Secondary | ICD-10-CM | POA: Diagnosis not present

## 2016-12-21 DIAGNOSIS — E861 Hypovolemia: Secondary | ICD-10-CM | POA: Diagnosis present

## 2016-12-21 DIAGNOSIS — Z7982 Long term (current) use of aspirin: Secondary | ICD-10-CM | POA: Diagnosis not present

## 2016-12-21 DIAGNOSIS — D649 Anemia, unspecified: Secondary | ICD-10-CM | POA: Diagnosis not present

## 2016-12-21 DIAGNOSIS — E871 Hypo-osmolality and hyponatremia: Secondary | ICD-10-CM | POA: Diagnosis present

## 2016-12-21 DIAGNOSIS — K529 Noninfective gastroenteritis and colitis, unspecified: Secondary | ICD-10-CM | POA: Diagnosis present

## 2016-12-21 DIAGNOSIS — D638 Anemia in other chronic diseases classified elsewhere: Secondary | ICD-10-CM | POA: Diagnosis not present

## 2016-12-21 DIAGNOSIS — E872 Acidosis, unspecified: Secondary | ICD-10-CM | POA: Diagnosis present

## 2016-12-21 DIAGNOSIS — I739 Peripheral vascular disease, unspecified: Secondary | ICD-10-CM | POA: Diagnosis present

## 2016-12-21 DIAGNOSIS — N179 Acute kidney failure, unspecified: Secondary | ICD-10-CM | POA: Diagnosis not present

## 2016-12-21 DIAGNOSIS — D509 Iron deficiency anemia, unspecified: Secondary | ICD-10-CM | POA: Diagnosis present

## 2016-12-21 DIAGNOSIS — Z882 Allergy status to sulfonamides status: Secondary | ICD-10-CM | POA: Diagnosis not present

## 2016-12-21 DIAGNOSIS — Z881 Allergy status to other antibiotic agents status: Secondary | ICD-10-CM | POA: Diagnosis not present

## 2016-12-21 DIAGNOSIS — I5032 Chronic diastolic (congestive) heart failure: Secondary | ICD-10-CM | POA: Diagnosis present

## 2016-12-21 DIAGNOSIS — K219 Gastro-esophageal reflux disease without esophagitis: Secondary | ICD-10-CM | POA: Diagnosis present

## 2016-12-21 DIAGNOSIS — J449 Chronic obstructive pulmonary disease, unspecified: Secondary | ICD-10-CM | POA: Diagnosis not present

## 2016-12-21 DIAGNOSIS — M150 Primary generalized (osteo)arthritis: Secondary | ICD-10-CM | POA: Diagnosis not present

## 2016-12-21 DIAGNOSIS — E875 Hyperkalemia: Secondary | ICD-10-CM | POA: Diagnosis not present

## 2016-12-21 DIAGNOSIS — I2510 Atherosclerotic heart disease of native coronary artery without angina pectoris: Secondary | ICD-10-CM | POA: Diagnosis present

## 2016-12-21 DIAGNOSIS — M159 Polyosteoarthritis, unspecified: Secondary | ICD-10-CM | POA: Diagnosis present

## 2016-12-21 DIAGNOSIS — R197 Diarrhea, unspecified: Secondary | ICD-10-CM

## 2016-12-21 DIAGNOSIS — R1310 Dysphagia, unspecified: Secondary | ICD-10-CM

## 2016-12-21 LAB — URINALYSIS, ROUTINE W REFLEX MICROSCOPIC
Bilirubin Urine: NEGATIVE
Glucose, UA: NEGATIVE mg/dL
Hgb urine dipstick: NEGATIVE
Ketones, ur: NEGATIVE mg/dL
NITRITE: NEGATIVE
PH: 5 (ref 5.0–8.0)
Protein, ur: NEGATIVE mg/dL
SPECIFIC GRAVITY, URINE: 1.005 (ref 1.005–1.030)

## 2016-12-21 LAB — CMP14+EGFR
ALK PHOS: 91 IU/L (ref 39–117)
ALT: 11 IU/L (ref 0–32)
AST: 24 IU/L (ref 0–40)
Albumin/Globulin Ratio: 1.9 (ref 1.2–2.2)
Albumin: 4 g/dL (ref 3.5–4.8)
BUN/Creatinine Ratio: 16 (ref 12–28)
BUN: 23 mg/dL (ref 8–27)
Bilirubin Total: 0.2 mg/dL (ref 0.0–1.2)
CO2: 16 mmol/L — AB (ref 20–29)
CREATININE: 1.47 mg/dL — AB (ref 0.57–1.00)
Calcium: 9.1 mg/dL (ref 8.7–10.3)
Chloride: 94 mmol/L — ABNORMAL LOW (ref 96–106)
GFR calc Af Amer: 40 mL/min/{1.73_m2} — ABNORMAL LOW (ref >59)
GFR calc non Af Amer: 35 mL/min/{1.73_m2} — ABNORMAL LOW (ref >59)
GLOBULIN, TOTAL: 2.1 g/dL (ref 1.5–4.5)
GLUCOSE: 94 mg/dL (ref 65–99)
Potassium: 5.6 mmol/L — ABNORMAL HIGH (ref 3.5–5.2)
SODIUM: 120 mmol/L — AB (ref 134–144)
Total Protein: 6.1 g/dL (ref 6.0–8.5)

## 2016-12-21 LAB — BASIC METABOLIC PANEL
ANION GAP: 4 — AB (ref 5–15)
BUN: 18 mg/dL (ref 6–20)
CALCIUM: 8.9 mg/dL (ref 8.9–10.3)
CO2: 18 mmol/L — ABNORMAL LOW (ref 22–32)
Chloride: 98 mmol/L — ABNORMAL LOW (ref 101–111)
Creatinine, Ser: 1.2 mg/dL — ABNORMAL HIGH (ref 0.44–1.00)
GFR calc Af Amer: 50 mL/min — ABNORMAL LOW (ref >60)
GFR, EST NON AFRICAN AMERICAN: 43 mL/min — AB (ref >60)
Glucose, Bld: 99 mg/dL (ref 65–99)
POTASSIUM: 5.3 mmol/L — AB (ref 3.5–5.1)
SODIUM: 120 mmol/L — AB (ref 135–145)

## 2016-12-21 LAB — COMPREHENSIVE METABOLIC PANEL
ALBUMIN: 3.6 g/dL (ref 3.5–5.0)
ALK PHOS: 76 U/L (ref 38–126)
ALT: 15 U/L (ref 14–54)
ANION GAP: 7 (ref 5–15)
AST: 26 U/L (ref 15–41)
BILIRUBIN TOTAL: 0.5 mg/dL (ref 0.3–1.2)
BUN: 20 mg/dL (ref 6–20)
CALCIUM: 8.9 mg/dL (ref 8.9–10.3)
CO2: 18 mmol/L — ABNORMAL LOW (ref 22–32)
Chloride: 94 mmol/L — ABNORMAL LOW (ref 101–111)
Creatinine, Ser: 1.41 mg/dL — ABNORMAL HIGH (ref 0.44–1.00)
GFR calc Af Amer: 41 mL/min — ABNORMAL LOW (ref >60)
GFR, EST NON AFRICAN AMERICAN: 36 mL/min — AB (ref >60)
GLUCOSE: 107 mg/dL — AB (ref 65–99)
Potassium: 5 mmol/L (ref 3.5–5.1)
Sodium: 119 mmol/L — CL (ref 135–145)
TOTAL PROTEIN: 6.2 g/dL — AB (ref 6.5–8.1)

## 2016-12-21 LAB — CBC
HCT: 28.6 % — ABNORMAL LOW (ref 36.0–46.0)
Hematocrit: 30.7 % — ABNORMAL LOW (ref 34.0–46.6)
Hemoglobin: 9.6 g/dL — ABNORMAL LOW (ref 11.1–15.9)
Hemoglobin: 9.6 g/dL — ABNORMAL LOW (ref 12.0–15.0)
MCH: 30.5 pg (ref 26.6–33.0)
MCH: 30.2 pg (ref 26.0–34.0)
MCHC: 31.3 g/dL — ABNORMAL LOW (ref 31.5–35.7)
MCHC: 33.6 g/dL (ref 30.0–36.0)
MCV: 98 fL — ABNORMAL HIGH (ref 79–97)
MCV: 89.9 fL (ref 78.0–100.0)
Platelets: 285 10*3/uL (ref 150–379)
Platelets: 286 10*3/uL (ref 150–400)
RBC: 3.15 x10E6/uL — ABNORMAL LOW (ref 3.77–5.28)
RBC: 3.18 MIL/uL — ABNORMAL LOW (ref 3.87–5.11)
RDW: 13.5 % (ref 12.3–15.4)
RDW: 12 % (ref 11.5–15.5)
WBC: 8.2 10*3/uL (ref 3.4–10.8)
WBC: 7.3 10*3/uL (ref 4.0–10.5)

## 2016-12-21 LAB — VITAMIN B12: VITAMIN B 12: 450 pg/mL (ref 232–1245)

## 2016-12-21 MED ORDER — SODIUM CHLORIDE 0.9 % IV SOLN
INTRAVENOUS | Status: AC
  Administered 2016-12-21 – 2016-12-22 (×3): via INTRAVENOUS

## 2016-12-21 MED ORDER — CHOLESTYRAMINE LIGHT 4 G PO PACK
4.0000 g | PACK | Freq: Every day | ORAL | Status: DC
  Filled 2016-12-21: qty 1

## 2016-12-21 MED ORDER — ALBUTEROL SULFATE (2.5 MG/3ML) 0.083% IN NEBU: 3.0000 mL | INHALATION_SOLUTION | Freq: Every day | RESPIRATORY_TRACT | Status: DC | PRN

## 2016-12-21 MED ORDER — ACETAMINOPHEN 325 MG PO TABS
650.0000 mg | ORAL_TABLET | Freq: Four times a day (QID) | ORAL | Status: DC | PRN
Start: 2016-12-21 — End: 2016-12-23

## 2016-12-21 MED ORDER — ACETAMINOPHEN 650 MG RE SUPP: 650.0000 mg | Freq: Four times a day (QID) | RECTAL | Status: DC | PRN

## 2016-12-21 MED ORDER — ASPIRIN EC 81 MG PO TBEC
81.0000 mg | DELAYED_RELEASE_TABLET | Freq: Every day | ORAL | Status: DC
  Administered 2016-12-22 – 2016-12-23 (×2): 81 mg via ORAL
  Filled 2016-12-21 (×2): qty 1

## 2016-12-21 MED ORDER — MELOXICAM 7.5 MG PO TABS
15.0000 mg | ORAL_TABLET | Freq: Every day | ORAL | Status: DC | PRN
  Filled 2016-12-21: qty 2

## 2016-12-21 MED ORDER — PROMETHAZINE HCL 25 MG PO TABS: 12.5000 mg | ORAL_TABLET | Freq: Four times a day (QID) | ORAL | Status: DC | PRN

## 2016-12-21 MED ORDER — MOMETASONE FURO-FORMOTEROL FUM 100-5 MCG/ACT IN AERO
2.0000 | INHALATION_SPRAY | Freq: Two times a day (BID) | RESPIRATORY_TRACT | Status: DC
  Filled 2016-12-21: qty 8.8

## 2016-12-21 MED ORDER — CETIRIZINE HCL 10 MG PO TABS
10.0000 mg | ORAL_TABLET | Freq: Every day | ORAL | Status: DC
  Administered 2016-12-21 – 2016-12-22 (×2): 10 mg via ORAL
  Filled 2016-12-21 (×2): qty 1

## 2016-12-21 MED ORDER — FLUCONAZOLE 100 MG PO TABS
100.0000 mg | ORAL_TABLET | Freq: Every day | ORAL | Status: DC
  Administered 2016-12-21 – 2016-12-22 (×2): 100 mg via ORAL
  Filled 2016-12-21 (×3): qty 1

## 2016-12-21 MED ORDER — ENOXAPARIN SODIUM 40 MG/0.4ML ~~LOC~~ SOLN
40.0000 mg | SUBCUTANEOUS | Status: DC
  Administered 2016-12-21 – 2016-12-22 (×2): 40 mg via SUBCUTANEOUS
  Filled 2016-12-21 (×3): qty 0.4

## 2016-12-21 MED ORDER — PANTOPRAZOLE SODIUM 40 MG PO TBEC
40.0000 mg | DELAYED_RELEASE_TABLET | Freq: Every day | ORAL | Status: DC
  Filled 2016-12-21: qty 1

## 2016-12-21 MED ORDER — IRBESARTAN 75 MG PO TABS: 75.0000 mg | ORAL_TABLET | Freq: Every day | ORAL | Status: DC

## 2016-12-21 MED ORDER — HYDROXYZINE HCL 25 MG PO TABS
12.5000 mg | ORAL_TABLET | Freq: Two times a day (BID) | ORAL | Status: DC
  Administered 2016-12-21 – 2016-12-22 (×2): 25 mg via ORAL
  Administered 2016-12-22 – 2016-12-23 (×2): 12.5 mg via ORAL
  Filled 2016-12-21 (×5): qty 1

## 2016-12-21 MED ORDER — MONTELUKAST SODIUM 10 MG PO TABS
10.0000 mg | ORAL_TABLET | Freq: Every day | ORAL | Status: DC
  Administered 2016-12-21: 10 mg via ORAL
  Filled 2016-12-21: qty 1

## 2016-12-21 MED ORDER — LORATADINE 10 MG PO TABS: 10.0000 mg | ORAL_TABLET | Freq: Every day | ORAL | Status: DC

## 2016-12-21 MED ORDER — NEBIVOLOL HCL 10 MG PO TABS
10.0000 mg | ORAL_TABLET | Freq: Every day | ORAL | Status: DC
  Administered 2016-12-21: 10 mg via ORAL
  Filled 2016-12-21 (×3): qty 1

## 2016-12-21 MED ORDER — TRAMADOL HCL 50 MG PO TABS
50.0000 mg | ORAL_TABLET | Freq: Two times a day (BID) | ORAL | Status: DC | PRN
  Administered 2016-12-22: 25 mg via ORAL
  Administered 2016-12-22: 50 mg via ORAL
  Filled 2016-12-21 (×2): qty 1

## 2016-12-21 MED ORDER — SODIUM CHLORIDE 0.9 % IV BOLUS (SEPSIS)
500.0000 mL | Freq: Once | INTRAVENOUS | Status: AC
  Administered 2016-12-21: 500 mL via INTRAVENOUS

## 2016-12-21 MED ORDER — DIPHENOXYLATE-ATROPINE 2.5-0.025 MG PO TABS: 1.0000 | ORAL_TABLET | Freq: Four times a day (QID) | ORAL | Status: DC | PRN

## 2016-12-21 MED ORDER — METRONIDAZOLE 500 MG PO TABS
500.0000 mg | ORAL_TABLET | Freq: Two times a day (BID) | ORAL | Status: DC
  Administered 2016-12-21 – 2016-12-22 (×3): 500 mg via ORAL
  Filled 2016-12-21 (×4): qty 1

## 2016-12-21 MED ORDER — ZOLPIDEM TARTRATE 5 MG PO TABS
5.0000 mg | ORAL_TABLET | Freq: Every day | ORAL | Status: DC
  Filled 2016-12-21 (×3): qty 1

## 2016-12-21 MED ORDER — SPIRONOLACTONE 25 MG PO TABS: 25.0000 mg | ORAL_TABLET | Freq: Every day | ORAL | Status: DC

## 2016-12-21 MED ORDER — FLUTICASONE PROPIONATE 50 MCG/ACT NA SUSP
1.0000 | Freq: Two times a day (BID) | NASAL | Status: DC
  Administered 2016-12-23: 1 via NASAL
  Filled 2016-12-21 (×2): qty 16

## 2016-12-21 NOTE — ED Triage Notes (Signed)
[facility] that was found after labs being collected yesterday. States that she has chronic diarrhea due to multiple GI problems. Dizziness for 2 weeks. Alert and oriented, pale on arrival. Recollected on arrival

## 2016-12-21 NOTE — ED Notes (Signed)
Patient aware that urine specimen needed. Unable to urinate at this time.

## 2016-12-21 NOTE — H&P (Signed)
Family Medicine Teaching [facility] Admission History and Physical Service Pager: [phone]  Patient name: [name] Medical record number: [id number] Date of birth: 16-Apr-1941 Age: 76 y.o. Gender: female  Primary Care Provider: Mayo, Allyn Kenner, MD Consultants: None Code Status: Full   Chief Complaint: Chronic diarrhea, dizziness and weakness  Assessment and Plan: [name] is a 76 y.o. female with a past medical history significant for COPD, asthma, lymphocytic colitis, anemia, CAD, HLD, HTN, PVD presenting with weakness and dizziness and found to be severely hyponatremic.  #Hyponatremia, chronic, severe Patient presented from clinic after complaining of severe diarrhea for the past month. Patient also complains of weakness and dizziness, seen at [facility] by Dr.Rumley and found to be hyponatremic told to go the the ED for admission. On presentation to the ED, initial BMP showed NA+ 120, repeat BMP with a Na+ of 119.Hyponatremia most likely secondary to chronic diarrhea for the past few weeks and possibly due to medication regimen (specifically spironolactone and Diovan). In this patient, hyponatremia could be a mixed picture. Will correct slowly to avoid osmotic demyelination at a rate of 3-6 meq/L/day given severity --Admit to FMTS, Admitting physician Dr. McDiarmid --Start patient on NS @ 100 cc/hr --BMP q4 --Discontinue spironolactone and losartan --Follow up on am CBC  --Follow up on am EKG --Monitor Vitals per floor protocol --Acetaminophen 650 mg q6 prn --Phenergan 12.5 mg q6 prn  #Chronic Diarrhea likely 2/2 Lymphocytic colitis, IBS Patient with history of lymphocytic colitis and chronic diarrhea. Patient with a partial colectomy in 2007. There is also a component of IBS, and patient report when she has a troubling even in her life it can trigger her diarrheal episodes. Diarrhea for the past few weeks likely secondary to colitis.  Patient is follow by GI (Dr. Ewing Schlein) and has  been on a specific regimen which seem to work for her. Patient has a history of eosophageal candida with lower GI involvement. --Will continue Metronidazole 500 mg bid --Will continue Lomotil  2.5-0.025 mg qid --Phenergan 12.5 m q6 prn --Cholestyramine 4 g daily --Diflucan 100 mg daily  --Considered starting patient on budesonide however, budesonide is on her allergy list for shortness of breath and rash. We'll hold off for now and clarify her allergy in the morning -Can consider GI consult in the morning versus letting her GI office note that she is in the hospital currently and see if they have any recommendations  #AKI Likely secondary to hypovolemia in the setting of severe GI losses. Baseline creatinine ~ 0.9-1.0. On this admission , creatine is 1.47 quickly improved to 1.20 with IVF received in the ED. --Will continue IVF  --Follow up on am BMP  - Try to avoid nephrotoxic agents  #COPD/Asthma Will continue home regimen. No wheezing or difficulty breathing this admission. --Continue Singulair 10 mg daily --Continue Dulera 2 puffs bid --Albuterol neb prn  #CAD/HFpEF/HTN Last Echo on 08/2015 showed and EF of 60%-65% with grade 1 DD. No signs of volume overload. Patient with a history of Right renal artery stenosis of 70% undergoing percutaneous transluminal angioplasty and stent deployment  as well as left subclavian stenosis with claudication and dizziness undergoing percutaneous transluminal angioplasty and stent to the left subclavian artery. Normotensive for age --Hold Spironolactone and Diovan in the setting of elevated K+ and low Na+ as this may not be contributing to electrolyte abnormalities --Continue Nebivolol 10 mg daily  #Allergies Will continue home regimen --Hydroxyzine 12.5-25 mg bid  --Fluticasone 50 mcg bid  --  Cetirizine 10 mg daily  #GERD --Pantoprazole 40 mg daily   #Insomnia Will continue home med. Patient hal a tab of 12.5 mg of ambien --Continue home  Ambien 5 mg qhs  -Consider discontinuing this medication with her PCP as it is on the beers list  Anemia: History of iron deficiency anemia,  could be anemia of chronic disease. Hemoglobin 9.6. Anemia panel in May showing increased ferritin and normal iron. Has been seeing a hematologist in outpatient setting and given IV iron. -Continue to monitor -May consider sending back to hematology in outpatient setting   Abnormal urinalysis: Urinalysis showing rare bacteria, moderate leukocytes, negative ketones. Patient asymptomatic with no dysuria, pelvic pain, flank pain. -Will not treat with antibiotics as this is asymptomatic bacteriuria  FEN/GI: Heart Healthy, Protonix 40 mg  Prophylaxis: Lovenox  Disposition: Home pending electrolytes correction and clinical improvement  History of Present Illness:  [name] is a 76 y.o. female with a past medical history significant for COPD, asthma, lymphocytic colitis, IBS, anemia, CAD, HLD, HTN, PVD presenting with dizziness and weakness and found to be severely hyponatremic. Patient reports NBNB diarrhea for the past month. Patient also endorse abdominal pain mostly on the left side and only one episode of NBNB emesis. Patient reports long history of diarrhea and has had a partial colectomy in 2007. Patient also has a history of IBS and candidal infection with GI involvement. Patient is followed by Dr.Magod (GI). Only associated symptoms are dizziness and weakness. Patient was sent from clinic after she found hyponatremic in clinic. In the ED, BMP show NA+ 120 and K+ of 5.6, repeat BMP was Na+ 119, K+ 5.0.  Review Of Systems: Per HPI with the following additions:   Review of Systems  Constitutional: Positive for malaise/fatigue.  HENT: Negative.   Eyes: Negative.   Respiratory: Negative.   Cardiovascular: Negative.   Gastrointestinal: Positive for abdominal pain, diarrhea, nausea and vomiting.  Genitourinary: Negative.   Musculoskeletal: Negative.    Skin: Negative.   Neurological: Positive for dizziness and weakness.  Endo/Heme/Allergies: Negative.   Psychiatric/Behavioral: Negative.     Patient Active Problem List   Diagnosis Date Noted  . Hyponatremia 12/21/2016  . History of tobacco abuse 11/09/2016  . COPD exacerbation (HCC) 04/03/2016  . Anemia, iron deficiency 03/10/2016  . Grief 09/17/2015  . Carotid artery stenosis 02/18/2014  . At high risk for falls 10/21/2012  . Osteoarthritis, multiple sites 06/20/2012  . Chronic diarrhea 02/15/2012  . ASTEATOTIC ECZEMA 07/01/2010  . Osteoarthritis of both knees 05/24/2010  . CORONARY ARTERY DISEASE 01/25/2009  . 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 RENAL ARTERY STENOSIS 09/21/2008  . HYPERCHOLESTEROLEMIA 08/23/2006  . DEPRESSION, MAJOR, RECURRENT 08/23/2006  . HYPERTENSION, BENIGN SYSTEMIC 08/23/2006  . RHINITIS, ALLERGIC 08/23/2006  . GASTROESOPHAGEAL REFLUX, NO ESOPHAGITIS 08/23/2006  . Irritable bowel syndrome 08/23/2006  . INCONTINENCE, STRESS, FEMALE 08/23/2006    Past Medical History: Past Medical History:  Diagnosis Date  . Arthritis   . Asthma   . CAD (coronary artery disease) 08/2008   LAD 40-50%, CFX 40%, RCA 40-50% w/ catheter spasm, EF > 60%  . History of nuclear stress test 01/2015   No scar or ischemia, EF > 80%  . Hypertension   . PVD (peripheral vascular disease) (HCC)    left subclavian stent & right renal stent 2010, subclavian stent patent on Doppler 2015    Past Surgical History: Past Surgical History:  Procedure Laterality Date  . ABDOMINAL HYSTERECTOMY     for  concern for CA  . ABDOMINAL SURGERY    . APPENDECTOMY    . CARDIAC CATHETERIZATION  09/17/2008   mild noncritical coronary artery disease-recommend medical therapy, high-grade left subclavian stenosis-recommend left subclavian percutaneous peripheral intervention (PPI). recommend doppler surveillance of renal arteries for mild left and moderate right renal artery stenosis  .  CARDIOVASCULAR STRESS TEST  03/21/2012   normal pattern of perfusion in all regions, no scintigraphic evidence of inducible myocardial ischemia, no EKG changes for ischemia.  Marland Kitchen. CAROTID DUPLEX  03/13/2013   bilateral ICAs demonstrated normal patency without evidence of a significant diameter reduction, moderate tortuosity noted throughout the left ICA with falsley elevated velocities in the mid sigment. left subclavian arterial stent demonstrated normal velocities without suggestion of a significant diameter reduction  . CHOLECYSTECTOMY    . DOPPLER ECHOCARDIOGRAPHY  03/21/2012   EF >55%, mild concentric LVH, LV systolic function is normal, there is aortic root sclerosis/calcification  . SUBCLAVIAN VEIN ANGIOPLASTY / STENTING Left 09/28/2008   L Subclavian 70-80% stenosis-10.0x2720mm self-expanding stent post dilated with a 7.0x2420mm FoxCross balloon. stenosis reduced from 70-80% to 0% residual. Stenting of R femoral artery 70% narrowing-5.0x12 Herculink stent deployed at 10atm and postdilated at 14atm resulting in reduction from 70% stenosis to 0% residual  . URETERAL STENT PLACEMENT      Social History: Social History  Substance Use Topics  . Smoking status: Former Games developermoker  . Smokeless tobacco: Never Used  . Alcohol use No   Additional social history: Please also refer to relevant sections of EMR.  Family History: Family History  Problem Relation Age of Onset  . Heart attack Mother        2008  . Heart attack Maternal Grandmother   . Cancer Maternal Grandmother    (If not completed, MUST add something in)  Allergies and Medications: Allergies  Allergen Reactions  . Budesonide-Formoterol Fumarate Shortness Of Breath and Rash  . Codeine Anaphylaxis  . Losartan Shortness Of Breath    Throat swells up, choking  . Sulfonamide Derivatives Other (See Comments)    Reaction=choking  . Ciprofloxacin Hives    Patient has to have NAME BRAND ONLY  . Clarinex [Desloratadine] Rash  .  Hydrochlorothiazide Swelling  . Neomycin Other (See Comments)    sores  . Polyethylene Glycol Rash  . Rosuvastatin Other (See Comments)    Pt reports she had a bad reaction, unsure what it was.  . Tramadol Other (See Comments)    Has used in past but her throat closed up once. Can take small doses  . Meloxicam Other (See Comments)    REACTION: Diarrehea with generic.  No problems with brand per pt.  . Nitrofurantoin Monohyd Macro Diarrhea   No current facility-administered medications on file prior to encounter.    Current Outpatient Prescriptions on File Prior to Encounter  Medication Sig Dispense Refill  . aspirin 81 MG tablet Take 81 mg by mouth daily.    . cetirizine (ZYRTEC ALLERGY) 10 MG tablet Take 1 tablet (10 mg total) by mouth daily. (Patient taking differently: Take 10 mg by mouth at bedtime. Use only brand name) 30 tablet 3  . Cholecalciferol 2000 units CAPS Take 1 capsule by mouth daily.    . cholestyramine light (PREVALITE) 4 GM/DOSE powder Take 4 g by mouth daily.     Marland Kitchen. DIOVAN 80 MG tablet TAKE 1 TABLET BY MOUTH DAILY 30 tablet 6  . diphenoxylate-atropine (LOMOTIL) 2.5-0.025 MG per tablet Take 1 tablet by mouth 4 (four) times  daily as needed for diarrhea or loose stools. 30 tablet 0  . fluconazole (DIFLUCAN) 100 MG tablet Take 1 tablet (100 mg total) by mouth daily. 30 tablet 0  . fluticasone (FLONASE) 50 MCG/ACT nasal spray Place 1 spray into both nostrils 2 (two) times daily. 16 g 5  . hydrOXYzine (ATARAX/VISTARIL) 25 MG tablet Take 0.5-1 tablets by mouth 2 (two) times daily.    . metroNIDAZOLE (FLAGYL) 250 MG tablet Take 1 tablet (250 mg total) by mouth 2 (two) times daily. (Patient taking differently: Take 500 mg by mouth 2 (two) times daily. ) 30 tablet 0  . MOBIC 15 MG tablet Take 1 tablet (15 mg total) by mouth daily. 30 tablet 3  . mometasone-formoterol (DULERA) 100-5 MCG/ACT AERO INHALE 2 PUFFS INTO THE LUNGS TWICE DAILY (Patient taking differently: Inhale 2 puffs  into the lungs as needed. INHALE 2 PUFFS INTO THE LUNGS TWICE DAILY) 39 g 0  . Multiple Vitamins-Minerals (CENTRUM SILVER ULTRA WOMENS PO) Take 1 tablet by mouth every morning.    . nebivolol (BYSTOLIC) 10 MG tablet Take 1 tablet (10 mg total) by mouth daily. (Patient taking differently: Take 10 mg by mouth at bedtime. ) 30 tablet 5  . NEXIUM 40 MG capsule TAKE ONE CAPSULE BY MOUTH EVERY DAY 90 capsule 2  . SINGULAIR 10 MG tablet TAKE 1 TABLET BY MOUTH EVERY NIGHT AT BEDTIME 30 tablet 3  . spironolactone (ALDACTONE) 25 MG tablet Take 25 mg by mouth daily.    Marland Kitchen zolpidem (AMBIEN CR) 12.5 MG CR tablet Take 1 tablet (12.5 mg total) by mouth at bedtime as needed for sleep. (Patient taking differently: Take 6.25 mg by mouth at bedtime as needed for sleep. Take half tablet) 15 tablet 3  . clotrimazole-betamethasone (LOTRISONE) cream Apply 1 application topically every 8 (eight) hours as needed.     . nitroGLYCERIN (NITROSTAT) 0.4 MG SL tablet PLACE 1 TABLET(0.4MG ) UNDER THE TONGUE EVERY 5 MINUTES AS NEEDED FOR CHEST PAIN (Patient not taking: Reported on 11/09/2016) 25 tablet 1  . phenazopyridine (PYRIDIUM) 200 MG tablet TAKE 1 TABLET BY MOUTH THREE TIMES DAILY AS NEEDED FOR PAIN (Patient taking differently: Take 200 mg by mouth as needed. TAKE 1 TABLET BY MOUTH THREE TIMES DAILY AS NEEDED FOR PAIN) 270 tablet 1  . PROAIR HFA 108 (90 BASE) MCG/ACT inhaler Inhale 1 puff into the lungs daily as needed.  1  . promethazine (PHENERGAN) 12.5 MG tablet Take 1 tablet (12.5 mg total) by mouth every 8 (eight) hours as needed. 30 tablet 0  . traMADol (ULTRAM) 50 MG tablet TAKE 1 TABLET BY MOUTH TWICE DAILY AS NEEDED FOR PAIN 60 tablet 1    Objective: BP 137/73   Pulse 68   Temp 97.3 F (36.3 C) (Oral)   Resp 17   SpO2 100%  Physical Exam:  General: Elderly woman, in no acute distress, pleasant, able to participate in exam Cardiac: RRR, normal heart sounds, no murmurs. 2+ radial and PT pulses  bilaterally Respiratory: CTAB, normal effort, No wheezes, rales or rhonchi Abdomen: soft, nondistended, Tender to palpation LUQ and LLQ, no hepatic or splenomegaly, +BS Extremities: no edema or cyanosis. WWP. Skin: warm and dry, no rashes noted Neuro: alert and oriented x4, no focal deficits, CNX-XII are intact Psych: Normal affect and mood  Labs and Imaging: CBC BMET   Recent Labs Lab 12/21/16 1435  WBC 7.3  HGB 9.6*  HCT 28.6*  PLT 286    Recent Labs Lab 12/21/16 1435  NA 119*  K 5.0  CL 94*  CO2 18*  BUN 20  CREATININE 1.41*  GLUCOSE 107*  CALCIUM 8.9      Diallo, Lilia Argue, MD 12/21/2016, 8:33 PM PGY-1, [facility] FPTS Intern pager: [phone], text pages welcome  I have separately seen and examined the patient. I have discussed the findings and exam with Dr Sydnee Cabal and agree with the above note.  My changes/additions are outlined in BLUE.   Anders Simmonds, MD [facility], PGY-2

## 2016-12-21 NOTE — Telephone Encounter (Signed)
[facility] for Prom[facility].PA completed online at www.covermymeds.com. PA was approved via [employer] until 09/20/2017.   Clovis PuMartin, Jovanni Rash L, RN

## 2016-12-21 NOTE — Telephone Encounter (Signed)
Contacted concerning lab results. Recommended going to emergency department for severe hyponatremia. Patient reported understanding.

## 2016-12-21 NOTE — ED Notes (Signed)
Na+ of 119 received from lab.  Dr. Felix PaciniJacobuwitz made aware, will prioritize rooming, no orders at this time

## 2016-12-21 NOTE — Telephone Encounter (Signed)
Called [facility] and [facility], he stated that this medication was ordered by Kendrick FriesKenneth Hamby, NP at [facility] [place], [place] and spoke with Tashita at [facility] in [place] and she stated prior Berkley Harveyauth was send in and has been approved for March 2018-March 2019, she also stated that the [facility] [place] and pt has not pick up medication, called and spoke with Ms Shea EvansDunn and she stated she in [place] and she can't get her medication, told her that's because it [facility] [place] and it can not be filled a second time, pt stated that she has not [facility], I [facility] back [facility] [place] and spoke with [facility] and told him what the pt has told me, in look into system and see that her medication has not been pick up and he will have medication transfer and will have pt pick [place] medication here in [place], called pt and made her aware that medication will be ready for pick up today.

## 2016-12-21 NOTE — ED Provider Notes (Signed)
[facility]-EMERGENCY DEPT Provider Note   CSN: [id number] Arrival date & time: 12/21/16  1414     History   Chief Complaint Chief Complaint  Patient presents with  . low sodium/ diarrhea    HPI [name] is a 76 y.o. female.  HPI sentinel from family practice for hyponatremia. Has had diarrhea for the last month. Watery. Sees GI for the same. Also has had some nausea and decreased oral intake. Seen at family practice Center yesterday. Had lab work drawn and showed a hyponatremia. Sent in for recheck and likely admission. She states she feels dizzy. No confusion. Has had nausea and the diarrhea. States she had previous strength related bowel and had bowel removed and that is why she has some chronic diarrhea. Has an appointment to see GI tomorrow. Past Medical History:  Diagnosis Date  . Arthritis   . Asthma   . CAD (coronary artery disease) 08/2008   LAD 40-50%, CFX 40%, RCA 40-50% w/ catheter spasm, EF > 60%  . History of nuclear stress test 01/2015   No scar or ischemia, EF > 80%  . Hypertension   . PVD (peripheral vascular disease) (HCC)    left subclavian stent & right renal stent 2010, subclavian stent patent on Doppler 2015    Patient Active Problem List   Diagnosis Date Noted  . History of tobacco abuse 11/09/2016  . COPD exacerbation (HCC) 04/03/2016  . Anemia, iron deficiency 03/10/2016  . Grief 09/17/2015  . Carotid artery stenosis 02/18/2014  . At high risk for falls 10/21/2012  . Osteoarthritis, multiple sites 06/20/2012  . Chronic diarrhea 02/15/2012  . ASTEATOTIC ECZEMA 07/01/2010  . Osteoarthritis of both knees 05/24/2010  . CORONARY ARTERY DISEASE 01/25/2009  . 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 RENAL ARTERY STENOSIS 09/21/2008  . HYPERCHOLESTEROLEMIA 08/23/2006  . DEPRESSION, MAJOR, RECURRENT 08/23/2006  . HYPERTENSION, BENIGN SYSTEMIC 08/23/2006  . RHINITIS, ALLERGIC 08/23/2006  . GASTROESOPHAGEAL REFLUX, NO ESOPHAGITIS 08/23/2006  . Irritable  bowel syndrome 08/23/2006  . INCONTINENCE, STRESS, FEMALE 08/23/2006    Past Surgical History:  Procedure Laterality Date  . ABDOMINAL HYSTERECTOMY     for concern for CA  . ABDOMINAL SURGERY    . APPENDECTOMY    . CARDIAC CATHETERIZATION  09/17/2008   mild noncritical coronary artery disease-recommend medical therapy, high-grade left subclavian stenosis-recommend left subclavian percutaneous peripheral intervention (PPI). recommend doppler surveillance of renal arteries for mild left and moderate right renal artery stenosis  . CARDIOVASCULAR STRESS TEST  03/21/2012   normal pattern of perfusion in all regions, no scintigraphic evidence of inducible myocardial ischemia, no EKG changes for ischemia.  Marland Kitchen. CAROTID DUPLEX  03/13/2013   bilateral ICAs demonstrated normal patency without evidence of a significant diameter reduction, moderate tortuosity noted throughout the left ICA with falsley elevated velocities in the mid sigment. left subclavian arterial stent demonstrated normal velocities without suggestion of a significant diameter reduction  . CHOLECYSTECTOMY    . DOPPLER ECHOCARDIOGRAPHY  03/21/2012   EF >55%, mild concentric LVH, LV systolic function is normal, there is aortic root sclerosis/calcification  . SUBCLAVIAN VEIN ANGIOPLASTY / STENTING Left 09/28/2008   L Subclavian 70-80% stenosis-10.0x3420mm self-expanding stent post dilated with a 7.0x4320mm [employer] balloon. stenosis reduced from 70-80% to 0% residual. Stenting of R femoral artery 70% narrowing-5.0x12 Herculink stent deployed at 10atm and postdilated at 14atm resulting in reduction from 70% stenosis to 0% residual  . URETERAL STENT PLACEMENT      OB History    No data  available       Home Medications    Prior to Admission medications   Medication Sig Start Date End Date Taking? Authorizing Provider  aspirin 81 MG tablet Take 81 mg by mouth daily.    [provider]  cetirizine (ZYRTEC ALLERGY) 10 MG tablet Take 1  tablet (10 mg total) by mouth daily. 05/01/14   Glori Luis, MD  Cholecalciferol 2000 units CAPS Take 1 capsule by mouth daily.    [provider]  cholestyramine light (PREVALITE) 4 GM/DOSE powder Take by mouth 3 (three) times daily with meals.    [provider]  clotrimazole-betamethasone (LOTRISONE) cream  06/06/12   [provider]  DIOVAN 80 MG tablet TAKE 1 TABLET BY MOUTH DAILY 08/17/14   Croitoru, Rachelle Hora, MD  diphenoxylate-atropine (LOMOTIL) 2.5-0.025 MG per tablet Take 1 tablet by mouth 4 (four) times daily as needed for diarrhea or loose stools. 05/28/13   Glori Luis, MD  fluconazole (DIFLUCAN) 100 MG tablet Take 1 tablet (100 mg total) by mouth daily. 12/20/16   Rumley, Grantley N, DO  fluticasone (FLONASE) 50 MCG/ACT nasal spray Place 1 spray into both nostrils 2 (two) times daily. 12/18/16   Mayo, Allyn Kenner, MD  hydrOXYzine (ATARAX/VISTARIL) 25 MG tablet Take 0.5-1 tablets by mouth 2 (two) times daily. 08/10/16   [provider]  metroNIDAZOLE (FLAGYL) 250 MG tablet Take 1 tablet (250 mg total) by mouth 2 (two) times daily. 12/20/16   Rumley, Portage N, DO  MOBIC 15 MG tablet Take 1 tablet (15 mg total) by mouth daily. 07/20/14   Glori Luis, MD  mometasone-formoterol [facility]) 100-5 MCG/ACT AERO INHALE 2 PUFFS INTO THE LUNGS TWICE DAILY 12/11/16   Palma Holter, MD  Multiple Vitamins-Minerals (CENTRUM SILVER ULTRA WOMENS PO) Take 1 tablet by mouth every morning.    [provider]  nebivolol (BYSTOLIC) 10 MG tablet Take 1 tablet (10 mg total) by mouth daily. 07/14/16   Rollene Rotunda, MD  NEXIUM 40 MG capsule TAKE ONE CAPSULE BY MOUTH EVERY DAY 05/14/14   Glori Luis, MD  nitroGLYCERIN (NITROSTAT) 0.4 MG SL tablet PLACE 1 TABLET(0.4MG ) UNDER THE TONGUE EVERY 5 MINUTES AS NEEDED FOR CHEST PAIN Patient not taking: Reported on 11/09/2016 06/11/15   Rollene Rotunda, MD  phenazopyridine (PYRIDIUM) 200 MG tablet TAKE 1  TABLET BY MOUTH THREE TIMES DAILY AS NEEDED FOR PAIN Patient not taking: Reported on 11/09/2016 07/24/13   Glori Luis, MD  PROAIR HFA 108 (90 BASE) MCG/ACT inhaler Inhale 1 puff into the lungs daily as needed. 05/28/14   [provider]  promethazine (PHENERGAN) 12.5 MG tablet Take 1 tablet (12.5 mg total) by mouth every 8 (eight) hours as needed. 12/20/16   Rumley, Surrey N, DO  SINGULAIR 10 MG tablet TAKE 1 TABLET BY MOUTH EVERY NIGHT AT BEDTIME 08/18/14   Glori Luis, MD  spironolactone (ALDACTONE) 25 MG tablet Take 25 mg by mouth daily.    [provider]  traMADol (ULTRAM) 50 MG tablet TAKE 1 TABLET BY MOUTH TWICE DAILY AS NEEDED FOR PAIN    Glori Luis, MD  zolpidem (AMBIEN CR) 12.5 MG CR tablet Take 1 tablet (12.5 mg total) by mouth at bedtime as needed for sleep. 12/18/12   Shelly Rubenstein, MD    Family History Family History  Problem Relation Age of Onset  . Heart attack Mother        2008  . Heart attack Maternal Grandmother   .  Cancer Maternal Grandmother     Social History Social History  Substance Use Topics  . Smoking status: Former Games developer  . Smokeless tobacco: Never Used  . Alcohol use No     Allergies   Budesonide-formoterol fumarate; Codeine; Losartan; Sulfonamide derivatives; Ciprofloxacin; Clarinex [desloratadine]; Hydrochlorothiazide; Neomycin; Polyethylene glycol; Rosuvastatin; Tramadol; Meloxicam; and Nitrofurantoin monohyd macro   Review of Systems Review of Systems  Constitutional: Positive for appetite change. Negative for fever.  HENT: Negative for congestion.   Eyes: Negative for photophobia.  Respiratory: Negative for shortness of breath.   Cardiovascular: Negative for chest pain.  Gastrointestinal: Positive for diarrhea and nausea.  Genitourinary: Negative for flank pain.  Musculoskeletal: Negative for back pain.  Neurological: Positive for light-headedness.  Hematological: Negative for adenopathy.    Psychiatric/Behavioral: Negative for confusion.     Physical Exam Updated Vital Signs BP (!) 134/59 (BP Location: Right Arm)   Pulse 67   Temp 97.3 F (36.3 C) (Oral)   Resp 17   SpO2 96%   Physical Exam  Constitutional: She appears well-developed.  HENT:  Head: Atraumatic.  Eyes: Pupils are equal, round, and reactive to light.  Neck: Neck supple.  Cardiovascular: Normal rate.   Pulmonary/Chest: Effort normal.  Abdominal: Soft. There is no tenderness.  Neurological: She is alert.  Skin: Skin is warm. Capillary refill takes less than 2 seconds.  Psychiatric: She has a normal mood and affect.     ED Treatments / Results  Labs (all labs ordered are listed, but only abnormal results are displayed) Labs Reviewed  COMPREHENSIVE METABOLIC PANEL - Abnormal; Notable for the following:       Result Value   Sodium 119 (*)    Chloride 94 (*)    CO2 18 (*)    Glucose, Bld 107 (*)    Creatinine, Ser 1.41 (*)    Total Protein 6.2 (*)    GFR calc non Af Amer 36 (*)    GFR calc Af Amer 41 (*)    All other components within normal limits  CBC - Abnormal; Notable for the following:    RBC 3.18 (*)    Hemoglobin 9.6 (*)    HCT 28.6 (*)    All other components within normal limits    EKG  EKG Interpretation None       Radiology No results found.  Procedures Procedures (including critical care time)  Medications Ordered in ED Medications - No data to display   Initial Impression / Assessment and Plan / ED Course  I have reviewed the triage vital signs and the nursing notes.  Pertinent labs & imaging results that were available during my care of the patient were reviewed by me and considered in my medical decision making (see chart for details).     Patient with hyponatremia. Has diarrhea nausea and some decreased oral intake. Feels little dizzy. Recheck from yesterday and results consistent. Will admit to family practice.  Final Clinical Impressions(s) / ED  Diagnoses   Final diagnoses:  Hyponatremia  Diarrhea, unspecified type    New Prescriptions New Prescriptions   No medications on file     Benjiman Core, MD 12/21/16 [phone]

## 2016-12-22 ENCOUNTER — Encounter (HOSPITAL_COMMUNITY): Payer: Self-pay | Admitting: Family Medicine

## 2016-12-22 DIAGNOSIS — E872 Acidosis, unspecified: Secondary | ICD-10-CM | POA: Diagnosis present

## 2016-12-22 DIAGNOSIS — E871 Hypo-osmolality and hyponatremia: Principal | ICD-10-CM

## 2016-12-22 DIAGNOSIS — D649 Anemia, unspecified: Secondary | ICD-10-CM | POA: Diagnosis present

## 2016-12-22 DIAGNOSIS — M150 Primary generalized (osteo)arthritis: Secondary | ICD-10-CM

## 2016-12-22 DIAGNOSIS — R1310 Dysphagia, unspecified: Secondary | ICD-10-CM

## 2016-12-22 LAB — CBC
HCT: 25.7 % — ABNORMAL LOW (ref 36.0–46.0)
HEMATOCRIT: 25.6 % — AB (ref 36.0–46.0)
HEMOGLOBIN: 8.7 g/dL — AB (ref 12.0–15.0)
Hemoglobin: 8.7 g/dL — ABNORMAL LOW (ref 12.0–15.0)
MCH: 30.6 pg (ref 26.0–34.0)
MCH: 30.6 pg (ref 26.0–34.0)
MCHC: 33.9 g/dL (ref 30.0–36.0)
MCHC: 34 g/dL (ref 30.0–36.0)
MCV: 90.1 fL (ref 78.0–100.0)
MCV: 90.5 fL (ref 78.0–100.0)
PLATELETS: 253 10*3/uL (ref 150–400)
Platelets: 268 10*3/uL (ref 150–400)
RBC: 2.84 MIL/uL — AB (ref 3.87–5.11)
RBC: 2.84 MIL/uL — ABNORMAL LOW (ref 3.87–5.11)
RDW: 12.3 % (ref 11.5–15.5)
RDW: 12.2 % (ref 11.5–15.5)
WBC: 5.8 10*3/uL (ref 4.0–10.5)
WBC: 6.7 10*3/uL (ref 4.0–10.5)

## 2016-12-22 LAB — RETICULOCYTES
RBC.: 2.84 MIL/uL — AB (ref 3.87–5.11)
RETIC COUNT ABSOLUTE: 71 10*3/uL (ref 19.0–186.0)
Retic Ct Pct: 2.5 % (ref 0.4–3.1)

## 2016-12-22 LAB — BASIC METABOLIC PANEL
ANION GAP: 6 (ref 5–15)
Anion gap: 4 — ABNORMAL LOW (ref 5–15)
Anion gap: 5 (ref 5–15)
Anion gap: 5 (ref 5–15)
Anion gap: 5 (ref 5–15)
Anion gap: 5 (ref 5–15)
BUN: 12 mg/dL (ref 6–20)
BUN: 13 mg/dL (ref 6–20)
BUN: 15 mg/dL (ref 6–20)
BUN: 15 mg/dL (ref 6–20)
BUN: 16 mg/dL (ref 6–20)
BUN: 16 mg/dL (ref 6–20)
CALCIUM: 9 mg/dL (ref 8.9–10.3)
CALCIUM: 8.5 mg/dL — AB (ref 8.9–10.3)
CALCIUM: 8.5 mg/dL — AB (ref 8.9–10.3)
CHLORIDE: 100 mmol/L — AB (ref 101–111)
CHLORIDE: 97 mmol/L — AB (ref 101–111)
CHLORIDE: 99 mmol/L — AB (ref 101–111)
CO2: 15 mmol/L — AB (ref 22–32)
CO2: 17 mmol/L — AB (ref 22–32)
CO2: 19 mmol/L — AB (ref 22–32)
CO2: 19 mmol/L — AB (ref 22–32)
CO2: 19 mmol/L — AB (ref 22–32)
CO2: 20 mmol/L — AB (ref 22–32)
CREATININE: 0.76 mg/dL (ref 0.44–1.00)
CREATININE: 0.97 mg/dL (ref 0.44–1.00)
CREATININE: 0.99 mg/dL (ref 0.44–1.00)
CREATININE: 0.99 mg/dL (ref 0.44–1.00)
CREATININE: 1.02 mg/dL — AB (ref 0.44–1.00)
CREATININE: 1.04 mg/dL — AB (ref 0.44–1.00)
Calcium: 8.5 mg/dL — ABNORMAL LOW (ref 8.9–10.3)
Calcium: 8.5 mg/dL — ABNORMAL LOW (ref 8.9–10.3)
Calcium: 8.6 mg/dL — ABNORMAL LOW (ref 8.9–10.3)
Chloride: 101 mmol/L (ref 101–111)
Chloride: 100 mmol/L — ABNORMAL LOW (ref 101–111)
Chloride: 102 mmol/L (ref 101–111)
GFR calc Af Amer: 60 mL/min — ABNORMAL LOW (ref >60)
GFR calc Af Amer: >60 mL/min (ref >60)
GFR calc Af Amer: >60 mL/min (ref >60)
GFR calc Af Amer: >60 mL/min (ref >60)
GFR calc Af Amer: >60 mL/min (ref >60)
GFR calc Af Amer: >60 mL/min (ref >60)
GFR calc non Af Amer: 52 mL/min — ABNORMAL LOW (ref >60)
GFR calc non Af Amer: 53 mL/min — ABNORMAL LOW (ref >60)
GFR calc non Af Amer: 55 mL/min — ABNORMAL LOW (ref >60)
GFR calc non Af Amer: 56 mL/min — ABNORMAL LOW (ref >60)
GFR calc non Af Amer: >60 mL/min (ref >60)
GFR, EST NON AFRICAN AMERICAN: 55 mL/min — AB (ref >60)
GLUCOSE: 106 mg/dL — AB (ref 65–99)
GLUCOSE: 109 mg/dL — AB (ref 65–99)
GLUCOSE: 125 mg/dL — AB (ref 65–99)
GLUCOSE: 88 mg/dL (ref 65–99)
GLUCOSE: 93 mg/dL (ref 65–99)
Glucose, Bld: 88 mg/dL (ref 65–99)
POTASSIUM: 4.7 mmol/L (ref 3.5–5.1)
POTASSIUM: 4.8 mmol/L (ref 3.5–5.1)
POTASSIUM: 4.9 mmol/L (ref 3.5–5.1)
Potassium: 4.8 mmol/L (ref 3.5–5.1)
Potassium: 4.6 mmol/L (ref 3.5–5.1)
Potassium: 4.7 mmol/L (ref 3.5–5.1)
SODIUM: 121 mmol/L — AB (ref 135–145)
SODIUM: 123 mmol/L — AB (ref 135–145)
SODIUM: 124 mmol/L — AB (ref 135–145)
Sodium: 125 mmol/L — ABNORMAL LOW (ref 135–145)
Sodium: 122 mmol/L — ABNORMAL LOW (ref 135–145)
Sodium: 123 mmol/L — ABNORMAL LOW (ref 135–145)

## 2016-12-22 LAB — FERRITIN: Ferritin: 571 ng/mL — ABNORMAL HIGH (ref 11–307)

## 2016-12-22 LAB — IRON AND TIBC
Iron: 78 ug/dL (ref 28–170)
SATURATION RATIOS: 37 % — AB (ref 10.4–31.8)
TIBC: 211 ug/dL — ABNORMAL LOW (ref 250–450)
UIBC: 133 ug/dL

## 2016-12-22 LAB — TSH: TSH: 1.107 u[IU]/mL (ref 0.350–4.500)

## 2016-12-22 LAB — FOLATE: Folate: 49.5 ng/mL (ref >5.9)

## 2016-12-22 MED ORDER — MELOXICAM 7.5 MG PO TABS
15.0000 mg | ORAL_TABLET | Freq: Every day | ORAL | Status: DC
  Administered 2016-12-22: 15 mg via ORAL
  Filled 2016-12-22: qty 2

## 2016-12-22 MED ORDER — PREMIER PROTEIN SHAKE
11.0000 [oz_av] | Freq: Every day | ORAL | Status: DC
  Administered 2016-12-22: 11 [oz_av] via ORAL
  Filled 2016-12-22 (×4): qty 325.31

## 2016-12-22 MED ORDER — VALSARTAN 80 MG PO TABS
40.0000 mg | ORAL_TABLET | Freq: Every day | ORAL | Status: DC
  Administered 2016-12-23: 40 mg via ORAL
  Filled 2016-12-22 (×2): qty 0.5

## 2016-12-22 MED ORDER — NEBIVOLOL HCL 5 MG PO TABS
10.0000 mg | ORAL_TABLET | Freq: Every day | ORAL | Status: AC
  Administered 2016-12-22: 10 mg via ORAL
  Filled 2016-12-22: qty 2

## 2016-12-22 MED ORDER — VALSARTAN 40 MG PO TABS
40.0000 mg | ORAL_TABLET | Freq: Every day | ORAL | Status: DC
  Administered 2016-12-22: 40 mg via ORAL
  Filled 2016-12-22: qty 1

## 2016-12-22 MED ORDER — CHOLESTYRAMINE LIGHT 4 G PO PACK
4.0000 g | PACK | Freq: Once | ORAL | Status: DC
  Filled 2016-12-22: qty 1

## 2016-12-22 MED ORDER — MONTELUKAST SODIUM 10 MG PO TABS
10.0000 mg | ORAL_TABLET | Freq: Every day | ORAL | Status: DC
  Filled 2016-12-22: qty 1

## 2016-12-22 MED ORDER — CHOLESTYRAMINE LIGHT 4 G PO PACK
8.0000 g | PACK | Freq: Every day | ORAL | Status: DC
  Administered 2016-12-23: 8 g via ORAL
  Filled 2016-12-22 (×2): qty 2

## 2016-12-22 MED ORDER — ESOMEPRAZOLE MAGNESIUM 40 MG PO CPDR
40.0000 mg | DELAYED_RELEASE_CAPSULE | Freq: Every day | ORAL | Status: DC
  Administered 2016-12-22: 40 mg via ORAL
  Filled 2016-12-22: qty 1

## 2016-12-22 MED ORDER — NON FORMULARY: 40.0000 mg | Freq: Every day | Status: DC

## 2016-12-22 MED ORDER — NEBIVOLOL HCL 10 MG PO TABS
10.0000 mg | ORAL_TABLET | Freq: Every day | ORAL | Status: DC
  Filled 2016-12-22: qty 1

## 2016-12-22 MED ORDER — LORAZEPAM 0.5 MG PO TABS
0.2500 mg | ORAL_TABLET | Freq: Once | ORAL | Status: AC
  Administered 2016-12-22: 0.25 mg via ORAL
  Filled 2016-12-22: qty 1

## 2016-12-22 NOTE — Progress Notes (Signed)
Initial Nutrition Assessment  DOCUMENTATION CODES:   Not applicable  INTERVENTION:   -Add Premier Protein daily  -Recommend Probiotic daily  -Provided pt with "Diarrhea Nutrition Therapy" handout   NUTRITION DIAGNOSIS:   Increased nutrient needs related to chronic illness (chronic diarrhea, colitis) as evidenced by estimated needs.  GOAL:   Patient will meet greater than or equal to 90% of their needs  MONITOR:   PO intake, Supplement acceptance, Labs, Weight trends  REASON FOR ASSESSMENT:   Malnutrition Screening Tool    ASSESSMENT:   76 yo female admitted with severe hyponatremia (chronic), AKI,  chronic diarrhea likely secondary to lymphocytic colitis, IBS; pt with hx of COPD, colitis, anemia, CAD, HL, HTN, PVD  Pt ate very good lunch today; reports good appetite at present. Pt reports she has been eating well at home, supplements with Premier Protein shake when needed. Pt also reports 4 pound wt loss in past 1 month  Pt reports she has yeast in her throat and stomach. Noted pt has hx of esophageal candida with lower GI involvement.  Noted pt currently on diflucan and has been on antiobitoics  Pt with chronic diarrhea that began in 2006. Provided pt with "Diarrhea Nutrition Therapy" handout. Pt reports she takes probiotic at home.   Nutrition-Focused physical exam completed. Findings are mild fat depletiobn in orbital area only, mild muscle depletion in temporal region, and mild edema.   Labs: sodium 124, Creatinine 1.02 Meds: NS at 100 ml/hr, cholestyramine light, imodium prn, diflucan  Diet Order:  Diet regular Room service appropriate? Yes; Fluid consistency: Thin  Skin:  Reviewed, no issues  Last BM:  12/22/16  Height:   Ht Readings from Last 1 Encounters:  12/21/16 5' 0.5" (1.537 m)    Weight:   Wt Readings from Last 1 Encounters:  12/21/16 144 lb (65.3 kg)    BMI:  Body mass index is 27.66 kg/m.  Estimated Nutritional Needs:   Kcal:   1600-1800 kcals  Protein:  80-90 g  Fluid:  >/= 2 L  EDUCATION NEEDS:   Education needs addressed  Romelle Starcherate Kevontae Burgoon MS, RD, LDN [phone] Pager  [phone] Weekend/On-Call Pager

## 2016-12-22 NOTE — Progress Notes (Signed)
Patient has her home medicines brought in by her friend.Advised patient that we have to count all meds and bring it to pharmacy and they will send it whenever it's scheduled for her to take. Patient refused to have her meds be brought to pharmacy. " If I lose one pill,I will sue". MD notified to come talk to patient,pharmacist also made aware and will come  talk to patient.[name]. [name], [name] [name], RN

## 2016-12-22 NOTE — Progress Notes (Signed)
Family Medicine Teaching Service Daily Progress Note Intern Pager: [phone]  Patient name: [name] Medical record number: [id number] Date of birth: Oct 03, 1940 Age: 76 y.o. Gender: female  Primary Care Provider: Mayo, Allyn KennerKaty Dodd, MD Consultants: None  Code Status: Full  Assessment and Plan: [name] is a 76 y.o. female with a past medical history significant for COPD, asthma, lymphocytic colitis, anemia, CAD, HLD, HTN, PVD presenting with weakness and dizziness and found to be severely hyponatremic.  #Hyponatremia, chronic, improving  Likely secondary to chronic diarrhea and possible medications use. This am Na+ is 124 and K+ is 4.9. Will continue to correct slowly to avoid osmotic demyelination at a rate of 3-6 meq/L/day given severity. --Continue NS @ 100 cc/hr --BMP q4 --Discontinue spironolactone and losartan --Follow up on am CBC  --Follow up on am EKG --Monitor Vitals per floor protocol --Acetaminophen 650 mg q6 prn --Phenergan 12.5 mg q6 prn --Will transition to re  #Chronic Diarrhea likely 2/2 Lymphocytic colitis, IBS No diarrheal episodes overnight, likely secondary IBS flair and history of lymphocytic colitis. --Will continue Metronidazole 500 mg bid --Will continue Lomotil  2.5-0.025 mg qid --Phenergan 12.5 m q6 prn --Cholestyramine 4 g daily --Diflucan 100 mg daily  --Consult [facility] this am for worsening diarrhea   #AKI Likely secondary to hypovolemia in the setting of severe GI losses. Baseline creatinine ~ 0.9-1.0. On this admission, creatine is 1.47 quickly improved to 1.20 with IVF received in the ED. This morning creatinine is 1.02. --Will continue IVF  --Follow up on am BMP  - Try to avoid nephrotoxic agents  #COPD/Asthma Will continue home regimen. No wheezing or difficulty breathing this admission. --Continue Singulair 10 mg daily --Continue Dulera 2 puffs bid --Albuterol neb prn  #CAD/HFpEF/HTN Last Echo on 08/2015 showed and EF of  60%-65% with grade 1 DD. No signs of volume overload. Patient with a history of Right renal artery stenosis of 70% undergoing percutaneoustransluminal angioplasty and stent deployment  as well as left subclavian stenosis with claudication and dizziness undergoing percutaneous transluminal angioplasty and stent to the left subclavian artery. Normotensive for age --Hold Spironolactone and Diovan in the setting of elevated K+ and low Na+ as this may not be contributing to electrolyte abnormalities --Continue Nebivolol 10 mg daily  #Allergies Will continue home regimen --Hydroxyzine 12.5-25 mg bid  --Fluticasone 50 mcg bid  --Cetirizine 10 mg daily  #GERD --Pantoprazole 40 mg daily   #Insomnia Will continue home med. Patient hal a tab of 12.5 mg of ambien --Continue home Ambien 5 mg qhs  -Consider discontinuing this medication with her PCP as it is on the beers list  #Anemia, chronic History of iron deficiency anemia, followed by hematology with extensive workup -Continue to monitor -May consider sending back to hematology in outpatient setting    FEN/GI: Heart Healthy, Protonix 40 mg  Prophylaxis: Lovenox  Disposition: Home pending clinical improvement  Subjective: Patient feeling better this morning, dizziness has resolved patient denies any episode of diarrhea overnight.    Objective: Temp:  [97.3 F (36.3 C)-98.5 F (36.9 C)] 97.4 F (36.3 C) (06/29 0501) Pulse Rate:  [59-71] 71 (06/29 0844) Resp:  [16-17] 16 (06/29 0501) BP: (117-147)/(50-78) 146/58 (06/29 0844) SpO2:  [88 %-100 %] 100 % (06/29 0844) Weight:  [144 lb (65.3 kg)] 144 lb (65.3 kg) (06/28 2034)  Physical Exam:  General: NAD, pleasant, able to participate in exam Cardiac: RRR, normal heart sounds, no murmurs. 2+ radial and PT pulses bilaterally Respiratory: CTAB, normal  effort, No wheezes, rales or rhonchi Abdomen: soft, nontender, nondistended, no hepatic or splenomegaly, +BS Extremities: no edema  or cyanosis. WWP. Skin: warm and dry, no rashes noted Neuro: alert and oriented x4, no focal deficits Psych: Normal affect and mood  Laboratory:  Recent Labs Lab 12/21/16 1435 12/21/16 2336 12/22/16 0523  WBC 7.3 6.7 5.8  HGB 9.6* 8.7* 8.7*  HCT 28.6* 25.6* 25.7*  PLT 286 253 268    Recent Labs Lab 12/20/16 1618  12/21/16 1435 12/21/16 2003 12/21/16 2336 12/22/16 0523  NA 120*  < > 119* 120* 121* 124*  123*  K 5.6*  --  5.0 5.3* 4.7 4.8  4.9  CL 94*  --  94* 98* 97* 100*  99*  CO2 16*  --  18* 18* 20* 19*  19*  BUN 23  < > 20 18 16 16  15   CREATININE 1.47*  --  1.41* 1.20* 0.76 0.99  1.02*  CALCIUM 9.1  --  8.9 8.9 8.5* 8.6*  8.5*  PROT 6.1  --  6.2*  --   --   --   BILITOT 0.2  --  0.5  --   --   --   ALKPHOS 91  --  76  --   --   --   ALT 11  --  15  --   --   --   AST 24  --  26  --   --   --   GLUCOSE 94  < > 107* 99 93 88  88  < > = values in this interval not displayed.    Imaging/Diagnostic Tests: No results found.  Lovena Neighbours, MD 12/22/2016, 9:42 AM PGY-1, [facility] Family Medicine FPTS Intern pager: [phone], text pages welcome

## 2016-12-22 NOTE — Progress Notes (Signed)
Patient doesn't want to take the generic drug and refused to take the cholestyramine, she only takes Prevalite Brand name.  Her friend is going to bring the meds from home whatever she takes at home.  Made the patient aware that she cannot keep the meds at bedside and will be send to the pharmacy, understands and acknowledges. Will continue to monitor.

## 2016-12-22 NOTE — Progress Notes (Signed)
Received a page from RN that patient was wanting to take her medications from home instead of the medications from our pharmacy. Her friend brought all of her medications from home so that she could take them while she is hospitalized. She was then told that the medications had to be sent down to pharmacy so that they can count them and confirm that they are the right meds. Patient refused to allow the nursing staff to send them down to the pharmacy because she "pays good money for these medications" and she is worried that they will get lost or stolen. Spoke with RN, who will call pharmacy to see if they can send someone up to patient's room to check the medications. I have also placed a care order stating that patient may have her medications at bedside.  Willadean CarolKaty Mayo, MD PGY-2

## 2016-12-22 NOTE — Assessment & Plan Note (Signed)
Takes NSAIDs

## 2016-12-23 LAB — BASIC METABOLIC PANEL
ANION GAP: 5 (ref 5–15)
ANION GAP: 5 (ref 5–15)
BUN: 10 mg/dL (ref 6–20)
BUN: 14 mg/dL (ref 6–20)
CALCIUM: 8.9 mg/dL (ref 8.9–10.3)
CALCIUM: 8.5 mg/dL — AB (ref 8.9–10.3)
CO2: 18 mmol/L — AB (ref 22–32)
CO2: 18 mmol/L — AB (ref 22–32)
CREATININE: 0.96 mg/dL (ref 0.44–1.00)
CREATININE: 0.95 mg/dL (ref 0.44–1.00)
Chloride: 101 mmol/L (ref 101–111)
Chloride: 103 mmol/L (ref 101–111)
GFR calc Af Amer: >60 mL/min (ref >60)
GFR calc Af Amer: >60 mL/min (ref >60)
GFR, EST NON AFRICAN AMERICAN: 57 mL/min — AB (ref >60)
GFR, EST NON AFRICAN AMERICAN: 58 mL/min — AB (ref >60)
GLUCOSE: 102 mg/dL — AB (ref 65–99)
GLUCOSE: 93 mg/dL (ref 65–99)
Potassium: 4.6 mmol/L (ref 3.5–5.1)
Potassium: 4.7 mmol/L (ref 3.5–5.1)
Sodium: 124 mmol/L — ABNORMAL LOW (ref 135–145)
Sodium: 126 mmol/L — ABNORMAL LOW (ref 135–145)

## 2016-12-23 LAB — HEMOGLOBIN A1C
HEMOGLOBIN A1C: 5.1 % (ref 4.8–5.6)
MEAN PLASMA GLUCOSE: 100 mg/dL

## 2016-12-23 MED ORDER — CHOLESTYRAMINE LIGHT 4 G PO PACK
8.0000 g | PACK | Freq: Every day | ORAL | Status: DC
  Administered 2016-12-23: 8 g via ORAL
  Filled 2016-12-23 (×2): qty 2

## 2016-12-23 NOTE — Progress Notes (Signed)
[name] to be D/C'd Home per MD order.  Discussed prescriptions and follow up appointments with the patient. Prescriptions given to patient, medication list explained in detail. Pt verbalized understanding.  Allergies as of 12/23/2016      Reactions   Budesonide-formoterol Fumarate Shortness Of Breath, Rash   Codeine Anaphylaxis   Losartan Shortness Of Breath   Throat swells up, choking   Sulfonamide Derivatives Other (See Comments)   Reaction=choking   Ciprofloxacin Hives   Patient has to have NAME BRAND ONLY Caused skin blisters and pus.    Clarinex [desloratadine] Rash   Hydrochlorothiazide Swelling   Neomycin Other (See Comments)   sores   Polyethylene Glycol Rash   Rosuvastatin Other (See Comments)   Pt reports she had a bad reaction, unsure what it was.   Tramadol Other (See Comments)   Has used in past but her throat closed up once. Can take small doses   Nebivolol    Can take BRAND NAME Bystolic ONLY -allergic to fillers in generic   Valsartan    Can take BRAND NAME DIOVAN ONLY -allergic to fillers in generic   Meloxicam Other (See Comments)   REACTION: Diarrehea with generic.  No problems with brand per pt.   Nitrofurantoin Monohyd Macro Diarrhea      Medication List    STOP taking these medications   spironolactone 25 MG tablet Commonly known as:  ALDACTONE     TAKE these medications   aspirin 81 MG tablet Take 81 mg by mouth daily.   CENTRUM SILVER ULTRA WOMENS PO Take 1 tablet by mouth every morning.   cetirizine 10 MG tablet Commonly known as:  ZYRTEC ALLERGY Take 1 tablet (10 mg total) by mouth daily. What changed:  when to take this  additional instructions   Cholecalciferol 2000 units Caps Take 1 capsule by mouth daily.   cholestyramine light 4 GM/DOSE powder Commonly known as:  PREVALITE Take 8 g by mouth daily. 8gm = 2 packets   clotrimazole-betamethasone cream Commonly known as:  LOTRISONE Apply 1 application topically every 8  (eight) hours as needed.   DIOVAN 80 MG tablet Generic drug:  valsartan TAKE 1 TABLET BY MOUTH DAILY   diphenoxylate-atropine 2.5-0.025 MG tablet Commonly known as:  LOMOTIL Take 1 tablet by mouth 4 (four) times daily as needed for diarrhea or loose stools.   fluconazole 100 MG tablet Commonly known as:  DIFLUCAN Take 1 tablet (100 mg total) by mouth daily.   fluticasone 50 MCG/ACT nasal spray Commonly known as:  FLONASE Place 1 spray into both nostrils 2 (two) times daily.   hydrOXYzine 25 MG tablet Commonly known as:  ATARAX/VISTARIL Take 0.5-1 tablets by mouth 2 (two) times daily.   metroNIDAZOLE 250 MG tablet Commonly known as:  FLAGYL Take 1 tablet (250 mg total) by mouth 2 (two) times daily. What changed:  how much to take   MOBIC 15 MG tablet Generic drug:  meloxicam Take 1 tablet (15 mg total) by mouth daily.   mometasone-formoterol 100-5 MCG/ACT Aero Commonly known as:  DULERA INHALE 2 PUFFS INTO THE LUNGS TWICE DAILY What changed:  how much to take  how to take this  when to take this  reasons to take this  additional instructions   nebivolol 10 MG tablet Commonly known as:  BYSTOLIC Take 1 tablet (10 mg total) by mouth daily. What changed:  when to take this   NEXIUM 40 MG capsule Generic drug:  esomeprazole TAKE ONE CAPSULE  BY MOUTH EVERY DAY   nitroGLYCERIN 0.4 MG SL tablet Commonly known as:  NITROSTAT PLACE 1 TABLET(0.4MG ) UNDER THE TONGUE EVERY 5 MINUTES AS NEEDED FOR CHEST PAIN   nystatin 100000 UNIT/ML suspension Commonly known as:  MYCOSTATIN Take 5 mLs by mouth 4 (four) times daily. thrush   phenazopyridine 200 MG tablet Commonly known as:  PYRIDIUM TAKE 1 TABLET BY MOUTH THREE TIMES DAILY AS NEEDED FOR PAIN What changed:  how much to take  how to take this  when to take this  reasons to take this  additional instructions   PROAIR HFA 108 (90 Base) MCG/ACT inhaler Generic drug:  albuterol Inhale 1 puff into the lungs  daily as needed.   promethazine 12.5 MG tablet Commonly known as:  PHENERGAN Take 1 tablet (12.5 mg total) by mouth every 8 (eight) hours as needed.   SINGULAIR 10 MG tablet Generic drug:  montelukast TAKE 1 TABLET BY MOUTH EVERY NIGHT AT BEDTIME   traMADol 50 MG tablet Commonly known as:  ULTRAM TAKE 1 TABLET BY MOUTH TWICE DAILY AS NEEDED FOR PAIN   zolpidem 12.5 MG CR tablet Commonly known as:  AMBIEN CR Take 1 tablet (12.5 mg total) by mouth at bedtime as needed for sleep. What changed:  how much to take  additional instructions       Vitals:   12/23/16 0506 12/23/16 1000  BP: (!) 141/64 (!) 172/68  Pulse: 65 66  Resp: 18 18  Temp: 97.9 F (36.6 C) 98 F (36.7 C)    Skin clean, dry and intact without evidence of skin break down, no evidence of skin tears noted. IV catheter discontinued intact. Site without signs and symptoms of complications. Dressing and pressure applied. Pt denies pain at this time. No complaints noted.  An After Visit Summary was printed and given to the patient. Patient escorted via WC, and D/C home via private auto.  Britt BologneseAnisha Mabe RN, BSN

## 2016-12-23 NOTE — Progress Notes (Signed)
Patient was found to be walking out and getting on the elevator. I informed patient that the MD would have to place D/C orders prior to her leaving. Patient stated, "I'm not a hostage, I can leave whenever I want to and I'm leaving now." I then asked if she would sign AMA form and she stated that she would not. I then stated that I would have to remove  IV to prevent infection. Iv removed. Patient agreed to stay until I retrieved her medications from pharmacy. Medications returned to patient along with copy of medication list. Andy, Charge Nurse called. Andy, RN agreed to contact MD. MD on unit and stated that if she waited on her D/C paperwork, that he would complete it promptly. Patient agreed to stay. Once D/C paperwork is completed, AVS will be given to patient and reviewed. Will continue to monitor.  

## 2016-12-23 NOTE — Discharge Summary (Signed)
Family Medicine [facility] Discharge Summary  Patient name: [name] Medical record number: [id number] Date of birth: Jan 24, 1941 Age: 76 y.o. Gender: female Date of Admission: 12/21/2016 Date of Discharge: 12/23/2016 Admitting Physician: Leighton Roachodd D McDiarmid, MD  Primary Care Provider: Campbell StallMayo, Katy Dodd, MD Consultants: None  Indication for Hospitalization: Hyponatremia  Discharge Diagnoses/Problem List:  Hyponatremia  Chronic Diarrhea IBS COPD HTN CAD  PVD  Disposition: Home  Discharge Condition: Stable and improving  Discharge Exam:  General: Elderly woman, in no acute distress, able to participate in exam Cardiac: RRR, normal heart sounds, no murmurs. 2+ radial and PT pulses bilaterally Respiratory: CTAB, normal effort, No wheezes, rales or rhonchi Abdomen: soft, nondistended, Tender to palpation LUQ and LLQ, no hepatic or splenomegaly, +BS Extremities: no edema or cyanosis. WWP. Skin: warm and dry, no rashes noted Neuro: alert and oriented x4, no focal deficits, CNX-XII are intact Psych: Normal affect and mood  Brief Hospital Course:   Patient is 76 yo female with a past medical history significant  COPD, asthma, lymphocytic colitis, IBS, anemia, CAD, HLD, HTN, PVD who presented here for hyponatremia. [facility] after lab results show a Na+ of 120 and K+ was 5.6. On admission, Na+ was 119 and K+ 5.0. Patient reported 4 weeks of diarrhea and some dizziness and weakness. Patient received a NS bolus in the ED, and was started continuous  NS @100  cc/hr. Sodium started to slowly correct. Spironolactone and Diovan were held in the setting of hyperkalemia on admission. Patient did well, on hospital day 1, NS was discontinued and patient was started on a diet and continue to do well. Patient was discharged on hospital day 2, with Na+ at 126 and K+4.7. Patient had close follow up with PCP and was stable with instructions to drink Gatorade or Pedialyte  in addition to her normal diet.   Issues for Follow Up:  1. On discharge Na+ was 126 and K+ was 4.7. Patient will need repeat BMP. She was admitted severely hyponatremic and hyperkalemic. 2. Patient was on spironolactone bid, came in hyperkalemic, was previously on once a day. Recent increase secondary to lower extremity edema. Could go back to once a day with close follow up with PCP with likely needed adjustment or switch. 3. Patient was also on Diovan, held on admission but patient insisted on having med during stay. Given half a dose, but given hyperkalemia will consider adjusting dosage. 4. Consider Combo Diovan/HCTZ for this patient which would also address LE edema. 5. Make sure patient made appointment with Dr. Maryjane [facility], she missed her appointment this past Friday due to hospitalization.  Significant Procedures: None  Significant Labs and Imaging:   Recent Labs Lab 12/21/16 1435 12/21/16 2336 12/22/16 0523  WBC 7.3 6.7 5.8  HGB 9.6* 8.7* 8.7*  HCT 28.6* 25.6* 25.7*  PLT 286 253 268    Recent Labs Lab 12/20/16 1618 12/21/16 1435  12/22/16 1230 12/22/16 1615 12/22/16 2044 12/23/16 0020 12/23/16 0428  NA 120* 119*  < > 125* 122* 123* 124* 126*  K 5.6* 5.0  < > 4.8 4.6 4.7 4.6 4.7  CL 94* 94*  < > 101 100* 102 101 103  CO2 16* 18*  < > 19* 17* 15* 18* 18*  GLUCOSE 94 107*  < > 106* 109* 125* 102* 93  BUN 23 20  < > 12 15 13 10 14   CREATININE 1.47* 1.41*  < > 0.99 1.04* 0.97 0.96 0.95  CALCIUM 9.1 8.9  < > 9.0 8.5* 8.5* 8.9 8.5*  ALKPHOS 91 76  --   --   --   --   --   --   AST 24 26  --   --   --   --   --   --   ALT 11 15  --   --   --   --   --   --   ALBUMIN 4.0 3.6  --   --   --   --   --   --   < > = values in this interval not displayed.   Results/Tests Pending at Time of Discharge: None   Discharge Medications:  Allergies as of 12/23/2016      Reactions   Budesonide-formoterol Fumarate Shortness Of Breath, Rash   Codeine Anaphylaxis   Losartan  Shortness Of Breath   Throat swells up, choking   Sulfonamide Derivatives Other (See Comments)   Reaction=choking   Ciprofloxacin Hives   Patient has to have NAME BRAND ONLY Caused skin blisters and pus.    Clarinex [desloratadine] Rash   Hydrochlorothiazide Swelling   Neomycin Other (See Comments)   sores   Polyethylene Glycol Rash   Rosuvastatin Other (See Comments)   Pt reports she had a bad reaction, unsure what it was.   Tramadol Other (See Comments)   Has used in past but her throat closed up once. Can take small doses   Nebivolol    Can take BRAND NAME Bystolic ONLY -allergic to fillers in generic   Valsartan    Can take BRAND NAME DIOVAN ONLY -allergic to fillers in generic   Meloxicam Other (See Comments)   REACTION: Diarrehea with generic.  No problems with brand per pt.   Nitrofurantoin Monohyd Macro Diarrhea      Medication List    STOP taking these medications   spironolactone 25 MG tablet Commonly known as:  ALDACTONE     TAKE these medications   aspirin 81 MG tablet Take 81 mg by mouth daily.   CENTRUM SILVER ULTRA WOMENS PO Take 1 tablet by mouth every morning.   cetirizine 10 MG tablet Commonly known as:  ZYRTEC ALLERGY Take 1 tablet (10 mg total) by mouth daily. What changed:  when to take this  additional instructions   Cholecalciferol 2000 units Caps Take 1 capsule by mouth daily.   cholestyramine light 4 GM/DOSE powder Commonly known as:  PREVALITE Take 8 g by mouth daily. 8gm = 2 packets   clotrimazole-betamethasone cream Commonly known as:  LOTRISONE Apply 1 application topically every 8 (eight) hours as needed.   DIOVAN 80 MG tablet Generic drug:  valsartan TAKE 1 TABLET BY MOUTH DAILY   diphenoxylate-atropine 2.5-0.025 MG tablet Commonly known as:  LOMOTIL Take 1 tablet by mouth 4 (four) times daily as needed for diarrhea or loose stools.   fluconazole 100 MG tablet Commonly known as:  DIFLUCAN Take 1 tablet (100 mg total)  by mouth daily.   fluticasone 50 MCG/ACT nasal spray Commonly known as:  FLONASE Place 1 spray into both nostrils 2 (two) times daily.   hydrOXYzine 25 MG tablet Commonly known as:  ATARAX/VISTARIL Take 0.5-1 tablets by mouth 2 (two) times daily.   metroNIDAZOLE 250 MG tablet Commonly known as:  FLAGYL Take 1 tablet (250 mg total) by mouth 2 (two) times daily. What changed:  how much to take   MOBIC 15 MG tablet Generic drug:  meloxicam Take 1  tablet (15 mg total) by mouth daily.   mometasone-formoterol 100-5 MCG/ACT Aero Commonly known as:  DULERA INHALE 2 PUFFS INTO THE LUNGS TWICE DAILY What changed:  how much to take  how to take this  when to take this  reasons to take this  additional instructions   nebivolol 10 MG tablet Commonly known as:  BYSTOLIC Take 1 tablet (10 mg total) by mouth daily. What changed:  when to take this   NEXIUM 40 MG capsule Generic drug:  esomeprazole TAKE ONE CAPSULE BY MOUTH EVERY DAY   nitroGLYCERIN 0.4 MG SL tablet Commonly known as:  NITROSTAT PLACE 1 TABLET(0.4MG ) UNDER THE TONGUE EVERY 5 MINUTES AS NEEDED FOR CHEST PAIN   nystatin 100000 UNIT/ML suspension Commonly known as:  MYCOSTATIN Take 5 mLs by mouth 4 (four) times daily. thrush   phenazopyridine 200 MG tablet Commonly known as:  PYRIDIUM TAKE 1 TABLET BY MOUTH THREE TIMES DAILY AS NEEDED FOR PAIN What changed:  how much to take  how to take this  when to take this  reasons to take this  additional instructions   PROAIR HFA 108 (90 Base) MCG/ACT inhaler Generic drug:  albuterol Inhale 1 puff into the lungs daily as needed.   promethazine 12.5 MG tablet Commonly known as:  PHENERGAN Take 1 tablet (12.5 mg total) by mouth every 8 (eight) hours as needed.   SINGULAIR 10 MG tablet Generic drug:  montelukast TAKE 1 TABLET BY MOUTH EVERY NIGHT AT BEDTIME   traMADol 50 MG tablet Commonly known as:  ULTRAM TAKE 1 TABLET BY MOUTH TWICE DAILY AS NEEDED  FOR PAIN   zolpidem 12.5 MG CR tablet Commonly known as:  AMBIEN CR Take 1 tablet (12.5 mg total) by mouth at bedtime as needed for sleep. What changed:  how much to take  additional instructions       Discharge Instructions: Please refer to Patient Instructions section of EMR for full details.  Patient was counseled important signs and symptoms that should prompt return to medical care, changes in medications, dietary instructions, activity restrictions, and follow up appointments.   Follow-Up Appointments: Follow-up Information    Pincus Large, DO Follow up on 12/26/2016.   Specialty:  Family Medicine Why:  your appointment is at 1:50 pm, please arrive early Contact information: [address] [place] [address] [phone]           Lovena Neighbours, MD 12/23/2016, 12:58 PM PGY-1, [facility]

## 2016-12-24 NOTE — Telephone Encounter (Signed)
OK for BMET.

## 2016-12-25 NOTE — Telephone Encounter (Signed)
S/w pt she states that she just got d/c from the hospital and has had diarrhea for 5 months and will discuss this at appt tomorrow and will call back and a schedule hospital follow up Wednesday when she feels a little better

## 2016-12-25 NOTE — Telephone Encounter (Signed)
No objections

## 2016-12-26 ENCOUNTER — Encounter: Payer: Self-pay | Admitting: Obstetrics and Gynecology

## 2016-12-26 ENCOUNTER — Ambulatory Visit (INDEPENDENT_AMBULATORY_CARE_PROVIDER_SITE_OTHER): Payer: Medicare Other | Admitting: Obstetrics and Gynecology

## 2016-12-26 VITALS — BP 122/60 | HR 75 | Temp 98.4°F | Wt 142.0 lb

## 2016-12-26 DIAGNOSIS — E871 Hypo-osmolality and hyponatremia: Secondary | ICD-10-CM | POA: Diagnosis not present

## 2016-12-26 DIAGNOSIS — Z09 Encounter for follow-up examination after completed treatment for conditions other than malignant neoplasm: Secondary | ICD-10-CM

## 2016-12-26 DIAGNOSIS — K529 Noninfective gastroenteritis and colitis, unspecified: Secondary | ICD-10-CM | POA: Diagnosis not present

## 2016-12-26 MED ORDER — DIPHENOXYLATE-ATROPINE 2.5-0.025 MG PO TABS: 1.0000 | ORAL_TABLET | Freq: Four times a day (QID) | ORAL | 0 refills | Status: DC | PRN

## 2016-12-26 MED ORDER — FLUCONAZOLE 150 MG PO TABS: 150.0000 mg | ORAL_TABLET | Freq: Every day | ORAL | 0 refills | Status: DC

## 2016-12-26 NOTE — Patient Instructions (Signed)
Follow-up with your GI doctor next week Medications given Will contact you about blood work collected today   Make appointment to see PCP for Diarrhea follow-up in 4 weeks

## 2016-12-26 NOTE — Progress Notes (Signed)
    Subjective: Chief Complaint  Patient presents with  . Hospitalization Follow-up  . Diarrhea    HPI: Fransisca ConnorsBetty J Roussell is a 76 y.o. presenting to clinic today for hospital follow-up. Patient was admitted to the hospital from 12/21/16-12/23/16 for hyponatremia and hyperkalemia. Electrolyte abnormalities were thought to be due to medications, diarrhea, and drinking hypotonic fluids. Patient states that today she continues to have diarrhea. Her PMH is significant for lymphocytic colitis, diverticulosis, and IBS. She follows with GI. She has an appointment next Tuesday. She takes daily metronidazole, diflucan prn, and imodium to help with symptoms. She denies fever. Having mild LLQ pain. Since discharge she states she has been drinking her Pedialyte. She stopped taking her spironolactone. Denies excess fluid. Weight continues to decrease.   ROS noted in HPI.   Past Medical, Surgical, Social, and Family History Reviewed & Updated per EMR.    History  Smoking Status  . Former Smoker  Smokeless Tobacco  . Never Used    Objective: BP 122/60   Pulse 75   Temp 98.4 F (36.9 C) (Oral)   Wt 142 lb (64.4 kg)   SpO2 98%   BMI 27.28 kg/m  Vitals and nursing notes reviewed  Physical Exam General: Elderly woman, in no acute distress, able to participate in exam Cardiac: RRR, normal heart sounds, no murmurs. 2+ radial and PT pulses bilaterally Respiratory: CTAB, normal effort, No wheezes, rales or rhonchi Abdomen: soft, nondistended, Tender to palpation LLQ,no hepatic or splenomegaly, +BS Extremities:no edema or cyanosis. WWP. Skin:warm and dry, no rashes noted Neuro: alert and oriented x4, no focal deficits Psych:Normal affect and mood  Assessment/Plan: 1. Chronic diarrhea Longstanding chronic condition. Patient encouraged to keep GI appointment for next week. Will check labs today. Continue current therapies. Refills given.  - Basic Metabolic Panel  2. Hyponatremia Na at hospital  discharge was 126. Will recheck today. Patient has decreased her hypotonic fluids. Recheck today.  - Basic Metabolic Panel  3. Hospital discharge follow-up Doing well since discharge.   PATIENT EDUCATION PROVIDED: See AVS    Orders Placed This Encounter  Procedures  . Basic Metabolic Panel    Meds ordered this encounter  Medications  . diphenoxylate-atropine (LOMOTIL) 2.5-0.025 MG tablet    Sig: Take 1 tablet by mouth 4 (four) times daily as needed for diarrhea or loose stools.    Dispense:  60 tablet    Refill:  0  . fluconazole (DIFLUCAN) 150 MG tablet    Sig: Take 1 tablet (150 mg total) by mouth daily.    Dispense:  20 tablet    Refill:  0    Caryl AdaJazma Yurianna Tusing, DO 12/26/2016, 11:26 AM

## 2016-12-27 LAB — BASIC METABOLIC PANEL
BUN / CREAT RATIO: 16 (ref 12–28)
BUN: 18 mg/dL (ref 8–27)
CALCIUM: 9.1 mg/dL (ref 8.7–10.3)
CHLORIDE: 99 mmol/L (ref 96–106)
CO2: 17 mmol/L — ABNORMAL LOW (ref 20–29)
Creatinine, Ser: 1.15 mg/dL — ABNORMAL HIGH (ref 0.57–1.00)
GFR calc non Af Amer: 47 mL/min/{1.73_m2} — ABNORMAL LOW (ref >59)
GFR, EST AFRICAN AMERICAN: 54 mL/min/{1.73_m2} — AB (ref >59)
Glucose: 103 mg/dL — ABNORMAL HIGH (ref 65–99)
POTASSIUM: 4.5 mmol/L (ref 3.5–5.2)
SODIUM: 134 mmol/L (ref 134–144)

## 2016-12-28 ENCOUNTER — Telehealth: Payer: Self-pay | Admitting: Student

## 2016-12-28 NOTE — Telephone Encounter (Signed)
Talked to patient about her BMP. Her creatinine is slightly up to 1.15. She is on Mobic for OA. She says her OA is very bad and can not stop it. She is also on Diovan. Her Na and K are normal. I recommended repeat BMP in a week or two. However, she days she is moving to a different city and can not follow up in our clinic. She says she would like to come by clinic and pick up the lab result take to [facility], where she would like to follow up. She is very appreciative about her care at her recent hospitalization specifically to Dr. Sydnee Cabaliallo.

## 2017-02-07 ENCOUNTER — Telehealth: Payer: Self-pay | Admitting: Cardiology

## 2017-02-07 NOTE — Telephone Encounter (Signed)
[name] is wanting to change from Dr. Antoine PocheHochrein to Dr. Royann Shiversroitoru . Will that be alright with you ? Thanks

## 2017-02-07 NOTE — Telephone Encounter (Signed)
No objections

## 2017-02-08 NOTE — Telephone Encounter (Signed)
Sure

## 2017-03-18 ENCOUNTER — Other Ambulatory Visit: Payer: Self-pay | Admitting: Internal Medicine

## 2017-03-26 ENCOUNTER — Other Ambulatory Visit: Payer: Self-pay | Admitting: Internal Medicine

## 2017-03-26 ENCOUNTER — Other Ambulatory Visit: Payer: Self-pay | Admitting: Cardiology

## 2017-03-26 MED ORDER — METRONIDAZOLE 250 MG PO TABS: 250.0000 mg | ORAL_TABLET | Freq: Two times a day (BID) | ORAL | 0 refills | Status: DC

## 2017-03-26 MED ORDER — FLUCONAZOLE 150 MG PO TABS: 150.0000 mg | ORAL_TABLET | Freq: Every day | ORAL | 0 refills | Status: DC

## 2017-03-26 NOTE — Telephone Encounter (Signed)
Pt called and needs refill on her Diflucan and Flagyl jw

## 2017-04-13 ENCOUNTER — Telehealth: Payer: Self-pay | Admitting: Internal Medicine

## 2017-04-13 NOTE — Telephone Encounter (Signed)
Pt would like refills on diflucan 150 mg and flagyl 500 mg.  She sees dr Nancy Marusmayo on Tuesday.  [facility] [place].  Please advise

## 2017-04-13 NOTE — Telephone Encounter (Signed)
[name] - this [facility] to [facility] but I don't think it is ours.  Can you relook at this and make sure it goes where it is supposed to?  Thanks!

## 2017-04-17 ENCOUNTER — Ambulatory Visit (INDEPENDENT_AMBULATORY_CARE_PROVIDER_SITE_OTHER): Payer: Medicare Other | Admitting: Internal Medicine

## 2017-04-17 ENCOUNTER — Encounter: Payer: Self-pay | Admitting: Internal Medicine

## 2017-04-17 VITALS — BP 122/58 | HR 64 | Temp 98.0°F | Wt 139.0 lb

## 2017-04-17 DIAGNOSIS — Z23 Encounter for immunization: Secondary | ICD-10-CM | POA: Diagnosis not present

## 2017-04-17 DIAGNOSIS — R062 Wheezing: Secondary | ICD-10-CM | POA: Diagnosis not present

## 2017-04-17 DIAGNOSIS — K5100 Ulcerative (chronic) pancolitis without complications: Secondary | ICD-10-CM | POA: Diagnosis not present

## 2017-04-17 MED ORDER — PNEUMOCOCCAL 13-VAL CONJ VACC IM SUSP
0.5000 mL | INTRAMUSCULAR | Status: AC
  Administered 2017-04-17: 0.5 mL via INTRAMUSCULAR

## 2017-04-17 MED ORDER — FLUCONAZOLE 150 MG PO TABS: 150.0000 mg | ORAL_TABLET | Freq: Every day | ORAL | 0 refills | Status: DC

## 2017-04-17 MED ORDER — METRONIDAZOLE 500 MG PO TABS: 500.0000 mg | ORAL_TABLET | Freq: Two times a day (BID) | ORAL | 0 refills | Status: DC

## 2017-04-17 NOTE — Patient Instructions (Addendum)
It was so nice to see you!  I have prescribed Diflucan and Flagyl for your diverticulitis.  Please schedule an appointment with Dr. Koval to haRaymondo Bandve pulmonary function testing done.  -Dr. Nancy MarusMayo

## 2017-04-18 DIAGNOSIS — K5190 Ulcerative colitis, unspecified, without complications: Secondary | ICD-10-CM | POA: Insufficient documentation

## 2017-04-18 NOTE — Progress Notes (Signed)
   [facility] Phone: [phone]  Subjective:  Megan RhodesBetty is a Megan Gainer[age] year old female presenting to clinic with diarrhea and wheezing.  Diarrhea: Has been going on for the last week. States this is one of her usual flares of her ulcerative colitis. The diarrhea started after her friend made her some food that had curry powder in it. She is having a lot of episodes of diarrhea per day, usually 7-8. She denies any melena or hematochezia. She has been trying to stay well-hydrated by drinking [name] and water. She has also been able to tolerate crackers.   Wheezing: Has been going on for 2 weeks. Has been using [name] and [name] as needed. Has needed to use the [name] ~2 times per day. She is also using the [facility] as needed because she doesn't like that it always causes her to develop thrush, even though she rinses her mouth out after she uses it. She thinks the wheezing has gotten worse after the weather change. She states she has been coughing occasionally. She has been bringing up a little bit of clear saliva, but she has not had increased sputum production. She states she has never had PFTs and does not know if she has COPD or asthma.  ROS: See HPI for pertinent positives and negatives  Past Medical History- HTN, CAD, RAS, ulcerative colitis, HLD, iron deficiency anemia, depression  Family history reviewed for today's visit. No changes.  Social history- patient is a former smoker.  Objective: BP (!) 122/58   Pulse 64   Temp 98 F (36.7 C) (Oral)   Wt 139 lb (63 kg)   BMI 26.70 kg/m  Gen: NAD, alert, cooperative with exam HEENT: NCAT, EOMI, MMM Neck: FROM, supple CV: RRR, no murmur Resp: Mildly decreased work of breathing throughout all lung fields. Lungs otherwise clear. No wheezing or crackles. GI: Soft, mild generalized tenderness to palpation, no rebound, no guarding.  Assessment/Plan: Ulcerative Colitis Flare: Patient having multiple episodes of  diarrhea over the last week. States that this feels like her typical UC flare. She is unable to tolerate Cipro, so her GI doctor always prescribes her a 14 day course of Flagyl and Diflucan, which helps her symptoms. - Will prescribed Flagyl bid x 14 days and Diflucan daily x 14 days. - Return precautions discussed - Follow-up if no improvement  Wheezing: Patient has been clinically diagnosed with COPD, but has never had PFTs performed. She is a former smoker. Taking [name] prn and [name] prn. No wheezing on exam. No shortness of breath, increased cough, or increased sputum production to suggest COPD exacerbation. O2 98% on RA in clinic. - Advised patient to take [facility] daily and continue to use [name] prn - Recommended that patient schedule an appointment with [name] to have PFTs performed. - Pneumococcal vaccine given today - Return precautions discussed - Follow-up if worsening   Megan CarolKaty Mayo, MD PGY-3

## 2017-04-18 NOTE — Assessment & Plan Note (Signed)
Patient having multiple episodes of diarrhea over the last week. States that this feels like her typical UC flare. She is unable to tolerate Cipro, so her GI doctor always prescribes her a 14 day course of Flagyl and Diflucan, which helps her symptoms. - Will prescribed Flagyl bid x 14 days and Diflucan daily x 14 days. - Return precautions discussed - Follow-up if no improvement

## 2017-04-18 NOTE — Assessment & Plan Note (Signed)
Patient has been clinically diagnosed with COPD, but has never had PFTs performed. She is a former smoker. Taking Dulera prn and Albuterol prn. No wheezing on exam. No shortness of breath, increased cough, or increased sputum production to suggest COPD exacerbation. O2 98% on RA in clinic. - Advised patient to take [facility] daily and continue to use Albuterol prn - Recommended that patient schedule an appointment with Dr. Raymondo BandKoval to have PFTs performed. - Pneumococcal vaccine given today - Return precautions discussed - Follow-up if worsening

## 2017-04-26 ENCOUNTER — Other Ambulatory Visit: Payer: Self-pay | Admitting: Cardiology

## 2017-05-01 ENCOUNTER — Other Ambulatory Visit: Payer: Self-pay | Admitting: Internal Medicine

## 2017-05-01 NOTE — Telephone Encounter (Signed)
Pt called and said her diarrhea is still a problem. She made an app for 11/21, but would like a refill on her diflucan and flagyl.

## 2017-05-01 NOTE — Telephone Encounter (Signed)
Will forward to MD to advise. Jazmin Hartsell,CMA  

## 2017-05-02 ENCOUNTER — Other Ambulatory Visit: Payer: Self-pay | Admitting: Internal Medicine

## 2017-05-02 MED ORDER — DIPHENOXYLATE-ATROPINE 2.5-0.025 MG PO TABS: 1.0000 | ORAL_TABLET | Freq: Four times a day (QID) | ORAL | 0 refills | Status: AC | PRN

## 2017-05-02 MED ORDER — FLUCONAZOLE 150 MG PO TABS: 150.0000 mg | ORAL_TABLET | Freq: Every day | ORAL | 0 refills | Status: DC

## 2017-05-02 MED ORDER — METRONIDAZOLE 500 MG PO TABS: 500.0000 mg | ORAL_TABLET | Freq: Two times a day (BID) | ORAL | 0 refills | Status: DC

## 2017-05-02 NOTE — Telephone Encounter (Signed)
Script faxed. Lenetta Piche,CMA  

## 2017-05-02 NOTE — Telephone Encounter (Signed)
That's fine. Would you mind calling in a refill because it won't let me e-prescribe for some reason. Thank you so much!

## 2017-05-02 NOTE — Telephone Encounter (Signed)
Pt contacted and informed of rx to her pharmacy and plan for GI if sxs do not improve. While on the phone, pt stated she would ike a refill on her Lomotil. Please advise.

## 2017-05-02 NOTE — Telephone Encounter (Signed)
Refill sent into pharmacy. If this doesn't help and she continues to have diarrhea, would recommend that she be seen by her GI doctor for further recommendations.

## 2017-05-09 NOTE — Telephone Encounter (Signed)
Patient left message on nurse line stating she is in [place] and needs lomotil sent to the [facility] there. Rx called into [facility] [place].

## 2017-05-15 ENCOUNTER — Ambulatory Visit: Payer: Medicare Other | Admitting: Cardiovascular Disease

## 2017-05-16 ENCOUNTER — Ambulatory Visit: Payer: Medicare Other | Admitting: Internal Medicine

## 2017-05-24 ENCOUNTER — Ambulatory Visit (INDEPENDENT_AMBULATORY_CARE_PROVIDER_SITE_OTHER): Payer: Medicare Other | Admitting: Internal Medicine

## 2017-05-24 ENCOUNTER — Encounter: Payer: Self-pay | Admitting: Physician Assistant

## 2017-05-24 ENCOUNTER — Ambulatory Visit: Payer: Medicare Other | Admitting: Physician Assistant

## 2017-05-24 ENCOUNTER — Other Ambulatory Visit: Payer: Self-pay

## 2017-05-24 ENCOUNTER — Encounter: Payer: Self-pay | Admitting: Internal Medicine

## 2017-05-24 DIAGNOSIS — J449 Chronic obstructive pulmonary disease, unspecified: Secondary | ICD-10-CM

## 2017-05-24 DIAGNOSIS — I10 Essential (primary) hypertension: Secondary | ICD-10-CM

## 2017-05-24 NOTE — Progress Notes (Deleted)
Cardiology Office Note   Date:  05/24/2017   ID:  Megan ConnorsBetty J [name], DOB 10-28-1940, MRN [id number]  PCP:  Campbell StallMayo, Katy Dodd, MD  Cardiologist: Dr. Royann Shiversroitoru (was Dr Antoine PocheHochrein) Theodore Demarkhonda Eevee Borbon, PA-C 04/03/2016  No chief complaint on file.   History of Present Illness: [name] is a 76 y.o. female with a history of cath 2010 w/ LAD 40-50%, CFX 40%, RCA 40-50% w/ catheter spasm noted, EF >60%, subclavian stent, right renal stent, HTN, HLD, nl MV 2016, UC, iron def anemia  Megan ConnorsBetty J Jeffus presents for ***   Past Medical History:  Diagnosis Date  . Arthritis   . Asthma   . CAD (coronary artery disease) 08/2008   LAD 40-50%, CFX 40%, RCA 40-50% w/ catheter spasm, EF > 60%  . History of nuclear stress test 01/2015   No scar or ischemia, EF > 80%  . Hypertension   . PVD (peripheral vascular disease) (HCC)    left subclavian stent & right renal stent 2010, subclavian stent patent on Doppler 2015  . UC (ulcerative colitis) [facility](HCC)     Past Surgical History:  Procedure Laterality Date  . ABDOMINAL HYSTERECTOMY     for concern for CA  . ABDOMINAL SURGERY    . APPENDECTOMY    . CARDIAC CATHETERIZATION  09/17/2008   mild noncritical coronary artery disease-recommend medical therapy, high-grade left subclavian stenosis-recommend left subclavian percutaneous peripheral intervention (PPI). recommend doppler surveillance of renal arteries for mild left and moderate right renal artery stenosis  . CARDIOVASCULAR STRESS TEST  03/21/2012   normal pattern of perfusion in all regions, no scintigraphic evidence of inducible myocardial ischemia, no EKG changes for ischemia.  Marland Kitchen. CAROTID DUPLEX  03/13/2013   bilateral ICAs demonstrated normal patency without evidence of a significant diameter reduction, moderate tortuosity noted throughout the left ICA with falsley elevated velocities in the mid sigment. left subclavian arterial stent demonstrated normal velocities without suggestion of a significant diameter  reduction  . CHOLECYSTECTOMY    . COLON SURGERY N/A 03/26/2006   Partial distal small bowel resection with primary anastamosis for volvulus w/infarction  . DOPPLER ECHOCARDIOGRAPHY  03/21/2012   EF >55%, mild concentric LVH, LV systolic function is normal, there is aortic root sclerosis/calcification  . SUBCLAVIAN VEIN ANGIOPLASTY / STENTING Left 09/28/2008   L Subclavian 70-80% stenosis-10.0x2320mm self-expanding stent post dilated with a 7.0x6120mm FoxCross balloon. stenosis reduced from 70-80% to 0% residual. Stenting of R femoral artery 70% narrowing-5.0x12 Herculink stent deployed at 10atm and postdilated at 14atm resulting in reduction from 70% stenosis to 0% residual  . URETERAL STENT PLACEMENT      Current Outpatient Medications  Medication Sig Dispense Refill  . aspirin 81 MG tablet Take 81 mg by mouth daily.    Marland Kitchen. BYSTOLIC 10 MG tablet TAKE 1 TABLET(10 MG) BY MOUTH DAILY 30 tablet 0  . cetirizine (ZYRTEC ALLERGY) 10 MG tablet Take 1 tablet (10 mg total) by mouth daily. (Patient taking differently: Take 10 mg by mouth at bedtime. Use only brand name) 30 tablet 3  . Cholecalciferol 2000 units CAPS Take 1 capsule by mouth daily.    . cholestyramine light (PREVALITE) 4 GM/DOSE powder Take 8 g by mouth daily. 8gm = 2 packets    . clotrimazole-betamethasone (LOTRISONE) cream Apply 1 application topically every 8 (eight) hours as needed.     Marland Kitchen. DIOVAN 80 MG tablet TAKE 1 TABLET BY MOUTH DAILY 30 tablet 6  . diphenoxylate-atropine (LOMOTIL) 2.5-0.025 MG tablet Take 1  tablet 4 (four) times daily as needed by mouth for diarrhea or loose stools. 60 tablet 0  . DULERA 100-5 MCG/ACT AERO INHALE 2 PUFFS INTO THE LUNGS TWICE DAILY 39 g 0  . fluconazole (DIFLUCAN) 150 MG tablet Take 1 tablet (150 mg total) daily by mouth. 14 tablet 0  . fluticasone (FLONASE) 50 MCG/ACT nasal spray Place 1 spray into both nostrils 2 (two) times daily. 16 g 5  . hydrOXYzine (ATARAX/VISTARIL) 25 MG tablet Take 0.5-1 tablets by  mouth 2 (two) times daily.    . metroNIDAZOLE (FLAGYL) 500 MG tablet Take 1 tablet (500 mg total) 2 (two) times daily by mouth. 28 tablet 0  . MOBIC 15 MG tablet Take 1 tablet (15 mg total) by mouth daily. 30 tablet 3  . Multiple Vitamins-Minerals (CENTRUM SILVER ULTRA WOMENS PO) Take 1 tablet by mouth every morning.    Marland Kitchen NEXIUM 40 MG capsule TAKE ONE CAPSULE BY MOUTH EVERY DAY 90 capsule 2  . nitroGLYCERIN (NITROSTAT) 0.4 MG SL tablet PLACE 1 TABLET(0.4MG ) UNDER THE TONGUE EVERY 5 MINUTES AS NEEDED FOR CHEST PAIN (Patient not taking: Reported on 11/09/2016) 25 tablet 1  . nystatin (MYCOSTATIN) 100000 UNIT/ML suspension Take 5 mLs by mouth 4 (four) times daily. thrush  0  . phenazopyridine (PYRIDIUM) 200 MG tablet TAKE 1 TABLET BY MOUTH THREE TIMES DAILY AS NEEDED FOR PAIN (Patient taking differently: Take 200 mg by mouth as needed. TAKE 1 TABLET BY MOUTH THREE TIMES DAILY AS NEEDED FOR PAIN) 270 tablet 1  . PROAIR HFA 108 (90 BASE) MCG/ACT inhaler Inhale 1 puff into the lungs daily as needed.  1  . promethazine (PHENERGAN) 12.5 MG tablet Take 1 tablet (12.5 mg total) by mouth every 8 (eight) hours as needed. 30 tablet 0  . SINGULAIR 10 MG tablet TAKE 1 TABLET BY MOUTH EVERY NIGHT AT BEDTIME 30 tablet 3  . traMADol (ULTRAM) 50 MG tablet TAKE 1 TABLET BY MOUTH TWICE DAILY AS NEEDED FOR PAIN 60 tablet 1  . zolpidem (AMBIEN CR) 12.5 MG CR tablet Take 1 tablet (12.5 mg total) by mouth at bedtime as needed for sleep. (Patient taking differently: Take 6.25 mg by mouth at bedtime as needed for sleep. Take half tablet) 15 tablet 3   No current facility-administered medications for this visit.     Allergies:   Budesonide-formoterol fumarate; Codeine; Losartan; Sulfonamide derivatives; Ciprofloxacin; Clarinex [desloratadine]; Hydrochlorothiazide; Neomycin; Polyethylene glycol; Rosuvastatin; Tramadol; Nebivolol; Valsartan; Meloxicam; and Nitrofurantoin monohyd macro    Social History:  The patient  reports  that she has quit smoking. she has never used smokeless tobacco. She reports that she does not drink alcohol.   Family History:  The patient's family history includes Cancer in her maternal grandmother; Heart attack in her maternal grandmother and mother.    ROS:  Please see the history of present illness. All other systems are reviewed and negative.    PHYSICAL EXAM: VS:  There were no vitals taken for this visit. , BMI There is no height or weight on file to calculate BMI. GEN: Well nourished, well developed, female in no acute distress  HEENT: normal for age  Neck: no JVD, no carotid bruit, no masses Cardiac: RRR; no murmur, no rubs, or gallops Respiratory:  clear to auscultation bilaterally, normal work of breathing GI: soft, nontender, nondistended, + BS MS: no deformity or atrophy; no edema; distal pulses are 2+ in all 4 extremities   Skin: warm and dry, no rash Neuro:  Strength and  sensation are intact Psych: euthymic mood, full affect   EKG:  EKG {ACTION; IS/IS [id number]} ordered today. The ekg ordered today demonstrates ***   Recent Labs: 12/21/2016: ALT 15; TSH 1.107 12/22/2016: Hemoglobin 8.7; Platelets 268 12/26/2016: BUN 18; Creatinine, Ser 1.15; Potassium 4.5; Sodium 134    Lipid Panel    Component Value Date/Time   CHOL 166 04/23/2008 2300   TRIG 90 04/23/2008 2300   HDL 64 04/23/2008 2300   CHOLHDL 2.6 Ratio 04/23/2008 2300   VLDL 18 04/23/2008 2300   LDLCALC 84 04/23/2008 2300     Wt Readings from Last 3 Encounters:  04/17/17 139 lb (63 kg)  12/26/16 142 lb (64.4 kg)  12/22/16 144 lb (65.3 kg)     Other studies Reviewed: Additional studies/ records that were reviewed today include: ***.  ASSESSMENT AND PLAN:  1.  ***   Current medicines are reviewed at length with the patient today.  The patient {ACTIONS; HAS/DOES NOT HAVE:19233} concerns regarding medicines.  The following changes have been made:  {PLAN; NO CHANGE:13088:s}  Labs/ tests  ordered today include: *** No orders of the defined types were placed in this encounter.    Disposition:   FU with Dr. Royann Shiversroitoru  Signed, Theodore Demarkhonda Charmel Pronovost, PA-C  05/24/2017 7:54 AM    [facility] Phone: [phone]; Fax: [phone]  This note was written with the assistance of speech recognition software. Please excuse any transcriptional errors.

## 2017-05-24 NOTE — Progress Notes (Signed)
   [facility] Phone: [phone]  Subjective:  [name] is a 76 year old female presenting to clinic for follow-up of her COPD and HTN.  COPD: Taking Dulera 2 puffs daily and albuterol as needed. Occasionally feels like [facility] causes her to have thrush on her tongue, even though she rinses her mouth out afterwards. The thrush gets better on its own. Having to use the Albuterol a couple of times per week. Mostly gets mildly short of breath with walking. No nighttime cough. Has never had formal testing for COPD. Former smoker.  HTN: Going well. Doesn't check her blood pressures at home, but they have all been good at her other doctor's visits. Takes Bystolic 10mg  daily and Valsartan 80mg  daily. No side effects. No headaches or dizziness. No lower extremity edema.  ROS: See HPI for pertinent positives and negatives  Past Medical History- CAD, HTN, RAS, hx subclavian steal syndrome, COPD, ulcerative colitis, HLD, depression, iron deficiency anemia.   Family history reviewed for today's visit. No changes.  Social history- patient is a former smoker.   Objective: BP 122/60   Pulse 74   Temp 97.8 F (36.6 C) (Oral)   Ht 5\' 1"  (1.549 m)   Wt 137 lb (62.1 kg)   SpO2 97%   BMI 25.89 kg/m  Gen: NAD, alert, cooperative with exam HEENT: NCAT, EOMI, MMM Neck: FROM, supple CV: RRR, no murmur Resp: Decreased air movement throughout all lung fields. CTAB. No wheezing. Normal work of breathing. Msk: No edema, warm, normal tone, moves UE/LE spontaneously  Assessment/Plan: COPD: Has been clinically diagnosed with COPD in the past due to wheezing with a history of tobacco use; however, has never had PFTs performed.  - Encouraged patient to schedule an appointment with Dr. Raymondo BandKoval for PFTs - Continue Dulera daily and Albuterol prn  HTN: Well-controlled. BP 122/60 in clinic today. - Continue Bystolic and Valsartan - Follow-up in 6 months.    Willadean CarolKaty Iliyana Convey, MD PGY-3

## 2017-05-24 NOTE — Patient Instructions (Signed)
It was wonderful to see you today!  Please schedule an appointment with Dr. Raymondo BandKoval to have lung function testing done as soon as you can.  -Dr. Nancy MarusMayo

## 2017-05-25 NOTE — Assessment & Plan Note (Signed)
Well-controlled. BP 122/60 in clinic today. - Continue Bystolic and Valsartan - Follow-up in 6 months.

## 2017-05-25 NOTE — Assessment & Plan Note (Signed)
Has been clinically diagnosed with COPD in the past due to wheezing with a history of tobacco use; however, has never had PFTs performed.  - Encouraged patient to schedule an appointment with Dr. Raymondo BandKoval for PFTs - Continue Dulera daily and Albuterol prn

## 2017-05-28 ENCOUNTER — Telehealth: Payer: Self-pay | Admitting: Internal Medicine

## 2017-05-28 NOTE — Telephone Encounter (Signed)
Pt would like a refill on methylprednisolone for her arthritis. She says it helps with her colonitis as well. Please advise

## 2017-05-28 NOTE — Telephone Encounter (Signed)
Will forward to MD to advise. Jazmin Hartsell,CMA  

## 2017-05-29 ENCOUNTER — Other Ambulatory Visit: Payer: Self-pay | Admitting: Internal Medicine

## 2017-05-29 MED ORDER — METHYLPREDNISOLONE 4 MG PO TABS: 4.0000 mg | ORAL_TABLET | Freq: Every day | ORAL | 0 refills | Status: DC

## 2017-05-29 NOTE — Telephone Encounter (Signed)
Three day course of methylprednisolone sent to patient's pharmacy.

## 2017-05-29 NOTE — Telephone Encounter (Signed)
Message has been sent to her PCP.

## 2017-05-29 NOTE — Telephone Encounter (Signed)
Pt called again about refliing her medcine ago.

## 2017-06-04 ENCOUNTER — Other Ambulatory Visit: Payer: Self-pay | Admitting: Cardiology

## 2017-06-06 ENCOUNTER — Other Ambulatory Visit: Payer: Self-pay | Admitting: Cardiology

## 2017-06-13 ENCOUNTER — Other Ambulatory Visit: Payer: Self-pay | Admitting: Internal Medicine

## 2017-06-13 MED ORDER — METHYLPREDNISOLONE 4 MG PO TABS: 4.0000 mg | ORAL_TABLET | Freq: Every day | ORAL | 0 refills | Status: AC

## 2017-06-13 NOTE — Telephone Encounter (Signed)
Pt was given lialda by dr Ewing Schleinmagod.  These are big pills that she cannot swallow.  Dr Nancy Marusmayo had given her methylprednisolone.  This worked with stopping at the diarrhea.  Please call in a refill to her pharmacy.[facility] [address]

## 2017-06-21 ENCOUNTER — Other Ambulatory Visit: Payer: Self-pay | Admitting: Internal Medicine

## 2017-06-21 ENCOUNTER — Other Ambulatory Visit: Payer: Self-pay | Admitting: Cardiology

## 2017-07-16 ENCOUNTER — Encounter: Payer: Self-pay | Admitting: Internal Medicine

## 2017-08-17 ENCOUNTER — Ambulatory Visit: Payer: Medicare Other | Admitting: Cardiovascular Disease

## 2017-08-20 ENCOUNTER — Ambulatory Visit (INDEPENDENT_AMBULATORY_CARE_PROVIDER_SITE_OTHER): Payer: Medicare Other | Admitting: Cardiovascular Disease

## 2017-08-20 ENCOUNTER — Encounter: Payer: Self-pay | Admitting: Cardiovascular Disease

## 2017-08-20 VITALS — BP 130/68 | HR 65 | Ht 60.6 in | Wt 141.2 lb

## 2017-08-20 DIAGNOSIS — I701 Atherosclerosis of renal artery: Secondary | ICD-10-CM

## 2017-08-20 DIAGNOSIS — I10 Essential (primary) hypertension: Secondary | ICD-10-CM

## 2017-08-20 DIAGNOSIS — I6523 Occlusion and stenosis of bilateral carotid arteries: Secondary | ICD-10-CM

## 2017-08-20 DIAGNOSIS — I2510 Atherosclerotic heart disease of native coronary artery without angina pectoris: Secondary | ICD-10-CM | POA: Diagnosis not present

## 2017-08-20 NOTE — Progress Notes (Signed)
Cardiology Office Note:    Date:  08/22/2017   ID:  [name], DOB [birth year], MRN [id number]  PCP:  Campbell Stall, MD  Cardiologist:  No primary care provider on file.   Referring MD: Campbell Stall, MD   Chief complaint: Chest pain  History of Present Illness:    [name] is a 77 y.o. female with a hx of PAD and moderate coronary artery disease.  She lives a large part of the year at [facility] and sees Dr. Costella Hatcher in [place] .  A couple weeks ago she had some sharp left-sided chest pain that occurred at rest.  Took 2 sublingual nitroglycerin without any relief.  After she took a third 1, she thinks that may be things improved, but the symptoms overall lasted much longer than would be expected from nitroglycerin effect.  During physical activity including yard work she does not have any chest pain.  She denies problems with shortness of breath at rest or with activity and has not had lower extremity edema, orthopnea or PND.  Denies palpitations, dizziness, syncope or focal neurological complaints.  Has a history of a left subclavian stent and the blood pressure in her right arm is consistently about 10 mmHg higher than on the left.  Duplex carotid ultrasound in March 2017 showed 40-59% bilateral stenoses.  She had a renal duplex ultrasound on the same date showing a patent right renal stent.  Does not have any symptoms of arm or leg claudication or vertebral steal syndrome.  She also has a history of previous right renal artery stent. cath 2010, LAD 40-50%, CFX 40%, RCA 40-50% w/ catheter spasm noted, EF >60%.  Normal recent nuclear stress test.  Most of her complaints are related to GI problems.  She has frequent issues with diarrhea but has not had bleeding.  She has previously undergone a partial ileocolectomy for "ulcerative colitis".  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  She takes cholestyramine because it helps with the diarrhea rather  than for its lipid lowering properties.  She also has a hiatal hernia.  She reports being intolerant to numerous generic medications due to the "fillers" that cause intestinal problems.  Last December she received 2 intravenous iron infusions for iron deficiency anemia.  She tells me that the iron deficiency is related to poor absorption rather than active bleeding.  She has occasional problems with wheezing.   09/21/2015 ECHO  - Left ventricle: The cavity size was normal. Wall thickness was   increased in a pattern of mild LVH. Systolic function was normal.   The estimated ejection fraction was in the range of 60% to 65%.   Wall motion was normal; there were no regional wall motion   abnormalities. Doppler parameters are consistent with abnormal   left ventricular relaxation (grade 1 diastolic dysfunction). - Aortic valve: There was no stenosis. - Mitral valve: Mildly calcified annulus. Mildly calcified leaflets   . There was no significant regurgitation. - Right ventricle: The cavity size was normal. Systolic function   was normal. - Tricuspid valve: Peak RV-RA gradient (S): 29 mm Hg. - Pulmonary arteries: PA peak pressure: 32 mm Hg (S). - Inferior vena cava: The vessel was normal in size. The   respirophasic diameter changes were in the normal range (>= 50%),   consistent with normal central venous pressure.  Impressions:  - Normal LV size with mild LV hypertrophy. EF 60-65%. Normal RV   size and  systolic function. No significant valvular   abnormalities.  06/30/2015 Myoview   Nuclear stress EF: 69%.  The left ventricular ejection fraction is hyperdynamic (>65%).  The study is normal.  This is a low risk study.   1. Low risk study 2. Nl perfusion and EF  Past Medical History:  Diagnosis Date  . Arthritis   . Asthma   . CAD (coronary artery disease) 08/2008   LAD 40-50%, CFX 40%, RCA 40-50% w/ catheter spasm, EF > 60%  . History of nuclear stress test 01/2015    No scar or ischemia, EF > 80%  . Hypertension   . PVD (peripheral vascular disease) (HCC)    left subclavian stent & right renal stent 2010, subclavian stent patent on Doppler 2015  . UC (ulcerative colitis) [facility])     Past Surgical History:  Procedure Laterality Date  . ABDOMINAL HYSTERECTOMY     for concern for CA  . ABDOMINAL SURGERY    . APPENDECTOMY    . CARDIAC CATHETERIZATION  09/17/2008   mild noncritical coronary artery disease-recommend medical therapy, high-grade left subclavian stenosis-recommend left subclavian percutaneous peripheral intervention (PPI). recommend doppler surveillance of renal arteries for mild left and moderate right renal artery stenosis  . CARDIOVASCULAR STRESS TEST  03/21/2012   normal pattern of perfusion in all regions, no scintigraphic evidence of inducible myocardial ischemia, no EKG changes for ischemia.  Marland Kitchen CAROTID DUPLEX  03/13/2013   bilateral ICAs demonstrated normal patency without evidence of a significant diameter reduction, moderate tortuosity noted throughout the left ICA with falsley elevated velocities in the mid sigment. left subclavian arterial stent demonstrated normal velocities without suggestion of a significant diameter reduction  . CHOLECYSTECTOMY    . COLON SURGERY N/A 03/26/2006   Partial distal small bowel resection with primary anastamosis for volvulus w/infarction  . DOPPLER ECHOCARDIOGRAPHY  03/21/2012   EF >55%, mild concentric LVH, LV systolic function is normal, there is aortic root sclerosis/calcification  . SUBCLAVIAN VEIN ANGIOPLASTY / STENTING Left 09/28/2008   L Subclavian 70-80% stenosis-10.0x70mm self-expanding stent post dilated with a 7.0x51mm FoxCross balloon. stenosis reduced from 70-80% to 0% residual. Stenting of R femoral artery 70% narrowing-5.0x12 Herculink stent deployed at 10atm and postdilated at 14atm resulting in reduction from 70% stenosis to 0% residual  . URETERAL STENT PLACEMENT      Current  Medications: Current Meds  Medication Sig  . aspirin 81 MG tablet Take 81 mg by mouth daily.  Marland Kitchen BYSTOLIC 10 MG tablet TAKE 1 TABLET(10 MG) BY MOUTH DAILY  . cetirizine (ZYRTEC ALLERGY) 10 MG tablet Take 1 tablet (10 mg total) by mouth daily. (Patient taking differently: Take 10 mg by mouth at bedtime. Use only brand name)  . Cholecalciferol 2000 units CAPS Take 1 capsule by mouth daily.  . cholestyramine light (PREVALITE) 4 GM/DOSE powder Take 8 g by mouth daily. 8gm = 2 packets  . clotrimazole-betamethasone (LOTRISONE) cream Apply 1 application topically every 8 (eight) hours as needed.   Marland Kitchen DIOVAN 80 MG tablet TAKE 1 TABLET BY MOUTH DAILY  . diphenoxylate-atropine (LOMOTIL) 2.5-0.025 MG tablet Take 1 tablet 4 (four) times daily as needed by mouth for diarrhea or loose stools.  . DULERA 100-5 MCG/ACT AERO INHALE 2 PUFFS BY MOUTH TWICE DAILY  . fluticasone (FLONASE) 50 MCG/ACT nasal spray Place 1 spray into both nostrils 2 (two) times daily.  . hydrOXYzine (ATARAX/VISTARIL) 25 MG tablet Take 0.5-1 tablets by mouth 2 (two) times daily.  . methylPREDNISolone (MEDROL) 4 MG tablet  Take 1 tablet (4 mg total) by mouth daily.  Marland Kitchen MOBIC 15 MG tablet Take 1 tablet (15 mg total) by mouth daily.  . Multiple Vitamins-Minerals (CENTRUM SILVER ULTRA WOMENS PO) Take 1 tablet by mouth every morning.  Marland Kitchen NEXIUM 40 MG capsule TAKE ONE CAPSULE BY MOUTH EVERY DAY  . nitroGLYCERIN (NITROSTAT) 0.4 MG SL tablet PLACE 1 TABLET(0.4MG ) UNDER THE TONGUE EVERY 5 MINUTES AS NEEDED FOR CHEST PAIN  . phenazopyridine (PYRIDIUM) 200 MG tablet TAKE 1 TABLET BY MOUTH THREE TIMES DAILY AS NEEDED FOR PAIN (Patient taking differently: Take 200 mg by mouth as needed. TAKE 1 TABLET BY MOUTH THREE TIMES DAILY AS NEEDED FOR PAIN)  . PROAIR HFA 108 (90 BASE) MCG/ACT inhaler Inhale 1 puff into the lungs daily as needed.  . promethazine (PHENERGAN) 12.5 MG tablet Take 1 tablet (12.5 mg total) by mouth every 8 (eight) hours as needed.  Marland Kitchen  SINGULAIR 10 MG tablet TAKE 1 TABLET BY MOUTH EVERY NIGHT AT BEDTIME  . traMADol (ULTRAM) 50 MG tablet TAKE 1 TABLET BY MOUTH TWICE DAILY AS NEEDED FOR PAIN  . zolpidem (AMBIEN CR) 12.5 MG CR tablet Take 1 tablet (12.5 mg total) by mouth at bedtime as needed for sleep. (Patient taking differently: Take 6.25 mg by mouth at bedtime as needed for sleep. Take half tablet)     Allergies:   Budesonide-formoterol fumarate; Codeine; Hydrochlorothiazide; Losartan; Sulfonamide derivatives; Tramadol; Triamterene; Ciprofloxacin; Clarinex [desloratadine]; Nebivolol; Neomycin; Polyethylene glycol; Rosuvastatin; Sulfabenzamide; Valsartan; Meloxicam; Nitrofurantoin; and Nitrofurantoin monohyd macro   Social History   Socioeconomic History  . Marital status: Single    Spouse name: None  . Number of children: None  . Years of education: None  . Highest education level: None  Social Needs  . Financial resource strain: None  . Food insecurity - worry: None  . Food insecurity - inability: None  . Transportation needs - medical: None  . Transportation needs - non-medical: None  Occupational History  . None  Tobacco Use  . Smoking status: Former Games developer  . Smokeless tobacco: Never Used  Substance and Sexual Activity  . Alcohol use: No  . Drug use: None  . Sexual activity: None  Other Topics Concern  . None  Social History Narrative  . None     Family History: The patient's family history includes Cancer in her maternal grandmother; Heart attack in her maternal grandmother and mother.  ROS:   Please see the history of present illness.     All other systems reviewed and are negative.  EKGs/Labs/Other Studies Reviewed:    The following studies were reviewed today: Echo, nuclear stress test, remote cath  EKG:  EKG is ordered today.  The ekg ordered today demonstrates normal sinus rhythm with mild first-degree AV block, PR 210 ms, otherwise normal, QTC 413 ms.  Recent Labs: 12/21/2016: ALT 15;  TSH 1.107 12/22/2016: Hemoglobin 8.7; Platelets 268 12/26/2016: BUN 18; Creatinine, Ser 1.15; Potassium 4.5; Sodium 134  Recent Lipid Panel    Component Value Date/Time   CHOL 166 04/23/2008 2300   TRIG 90 04/23/2008 2300   HDL 64 04/23/2008 2300   CHOLHDL 2.6 Ratio 04/23/2008 2300   VLDL 18 04/23/2008 2300   LDLCALC 84 04/23/2008 2300    Physical Exam:    VS:  BP 130/68 (BP Location: Right Arm)   Pulse 65   Ht 5' 0.6" (1.539 m)   Wt 141 lb 3.2 oz (64 kg)   BMI 27.03 kg/m  Wt Readings from Last 3 Encounters:  08/20/17 141 lb 3.2 oz (64 kg)  05/24/17 137 lb (62.1 kg)  04/17/17 139 lb (63 kg)     GEN:  Well nourished, well developed in no acute distress HEENT: Normal NECK: No JVD; No carotid bruits LYMPHATICS: No lymphadenopathy CARDIAC: RRR, no murmurs, rubs, gallops RESPIRATORY:  Clear to auscultation without rales, wheezing or rhonchi  ABDOMEN: Soft, non-tender, non-distended MUSCULOSKELETAL:  No edema; No deformity  SKIN: Warm and dry NEUROLOGIC:  Alert and oriented x 3 PSYCHIATRIC:  Normal affect   ASSESSMENT:    1. Atherosclerosis of native coronary artery of native heart without angina pectoris   2. RENAL ARTERY STENOSIS   3. Bilateral carotid artery stenosis   4. Essential hypertension    PLAN:    In order of problems listed above:  1. CAD: Her episode of chest discomfort a couple of weeks ago sounds noncardiac to me.  Can perform relatively intense physical activity without problems.  Her ECG is low risk.  The focus remains on risk factor modification.  We will try to get her most recent lab results from her primary care doctor.  Target LDL under 70. 2. PAD: Asymptomatic.  We will repeat her carotid and renal duplex ultrasound. 3. HTN: Controlled.  Her "real" BP is in her right arm.   Medication Adjustments/Labs and Tests Ordered: Current medicines are reviewed at length with the patient today.  Concerns regarding medicines are outlined above.  Orders  Placed This Encounter  Procedures  . EKG 12-Lead   No orders of the defined types were placed in this encounter.   Signed, Thurmon FairMihai Chelise Hanger, MD  08/22/2017 5:43 PM    [facility]

## 2017-08-20 NOTE — Patient Instructions (Signed)
Medication Instructions: Dr Royann Shiversroitoru recommends that you continue on your current medications as directed. Please refer to the Current Medication list given to you today.  Labwork: Please have Dr Costella HatcherHamby fax us labs when you have them drawn  Testing/Procedures: 1. Carotid Duplex - Your physician has requested that you have a carotid duplex. This test is an ultrasound of the carotid arteries in your neck. It looks at blood flow through these arteries that supply the brain with blood. Allow one hour for this exam. There are no restrictions or special instructions.  2. Renal Artery Duplex - Your physician has requested that you have a renal artery duplex. During this test, an ultrasound is used to evaluate blood flow to the kidneys. Allow one hour for this exam. Do not eat after midnight the day before and avoid carbonated beverages. Take your medications as you usually do.  Follow-up: Dr Royann Shiversroitoru recommends that you schedule a follow-up appointment in 12 months. You will receive a reminder letter in the mail two months in advance. If you don't receive a letter, please call our office to schedule the follow-up appointment.  If you need a refill on your cardiac medications before your next appointment, please call your pharmacy.

## 2017-08-27 ENCOUNTER — Telehealth: Payer: Self-pay | Admitting: Cardiovascular Disease

## 2017-08-27 NOTE — Telephone Encounter (Signed)
If the question is about AAA - the abdominal aorta will be well visualized when we do the renal arteries.  As far as the aortic valve is concerned, that has been normal on serial echos and does not need to be reevaluated at this time. MCR

## 2017-08-27 NOTE — Telephone Encounter (Signed)
New message  Pt wanst to know if her aortic valvel will be checked on her appt for the renal and carotid. Please call

## 2017-08-27 NOTE — Telephone Encounter (Signed)
Returned call to patient, patient was wondering if there was a AAA [place] scheduled with her other testing.  Advised only testing scheduled is renal US and carotid US.  Patient reports Dr. Alanda Amass use to follow this and it hasn't been checked in several years.   Patient was wondering if this was needed.  Advised patient per chart review unable to locate that AAA [place] or imaging was completed or mention of hx of.   Advised she maybe thinking of a echocardiogram, patient denies and states this was of her abdominal aorta.     Advised I am unable to locate but would send to Dr. Royann Shivers to review and advise.  Patient aware and verbalized understanding.

## 2017-08-27 NOTE — Telephone Encounter (Signed)
Patient aware and verbalized understanding. °

## 2017-09-06 ENCOUNTER — Other Ambulatory Visit: Payer: Self-pay | Admitting: Internal Medicine

## 2017-09-06 ENCOUNTER — Telehealth: Payer: Self-pay

## 2017-09-06 MED ORDER — FLUCONAZOLE 100 MG PO TABS: 100.0000 mg | ORAL_TABLET | Freq: Every day | ORAL | 0 refills | Status: DC

## 2017-09-06 MED ORDER — METRONIDAZOLE 500 MG PO TABS: 500.0000 mg | ORAL_TABLET | Freq: Two times a day (BID) | ORAL | 0 refills | Status: AC

## 2017-09-06 NOTE — Telephone Encounter (Signed)
Pt called nurse line, requesting [facility] flag[facility] Pt call back [phone] Shawna OrleansMeredith B Kazue Cerro, RN

## 2017-09-06 NOTE — Telephone Encounter (Signed)
Medications sent into patient's pharmacy.

## 2017-09-13 ENCOUNTER — Encounter (HOSPITAL_COMMUNITY): Payer: Medicare Other

## 2017-09-15 ENCOUNTER — Other Ambulatory Visit: Payer: Self-pay | Admitting: Cardiovascular Disease

## 2017-09-18 ENCOUNTER — Ambulatory Visit (HOSPITAL_COMMUNITY): Payer: Medicare Other

## 2017-10-04 ENCOUNTER — Other Ambulatory Visit: Payer: Self-pay | Admitting: Internal Medicine

## 2017-10-26 ENCOUNTER — Ambulatory Visit (HOSPITAL_BASED_OUTPATIENT_CLINIC_OR_DEPARTMENT_OTHER)
Admission: RE | Admit: 2017-10-26 | Discharge: 2017-10-26 | Disposition: A | Discharge status: Home / Self Care | Payer: Medicare Other | Source: Ambulatory Visit | Attending: Cardiovascular Disease | Admitting: Cardiovascular Disease

## 2017-10-26 ENCOUNTER — Ambulatory Visit (HOSPITAL_COMMUNITY)
Admission: RE | Admit: 2017-10-26 | Discharge: 2017-10-26 | Disposition: A | Discharge status: Home / Self Care | Payer: Medicare Other | Source: Ambulatory Visit | Attending: Cardiovascular Disease | Admitting: Cardiovascular Disease

## 2017-10-26 DIAGNOSIS — I2510 Atherosclerotic heart disease of native coronary artery without angina pectoris: Secondary | ICD-10-CM | POA: Insufficient documentation

## 2017-10-26 DIAGNOSIS — I701 Atherosclerosis of renal artery: Secondary | ICD-10-CM

## 2017-10-26 DIAGNOSIS — I774 Celiac artery compression syndrome: Secondary | ICD-10-CM | POA: Insufficient documentation

## 2017-10-26 DIAGNOSIS — Z87891 Personal history of nicotine dependence: Secondary | ICD-10-CM | POA: Diagnosis not present

## 2017-10-26 DIAGNOSIS — I10 Essential (primary) hypertension: Secondary | ICD-10-CM | POA: Diagnosis not present

## 2017-10-26 DIAGNOSIS — E785 Hyperlipidemia, unspecified: Secondary | ICD-10-CM | POA: Diagnosis not present

## 2017-10-26 DIAGNOSIS — I6523 Occlusion and stenosis of bilateral carotid arteries: Secondary | ICD-10-CM

## 2017-11-05 ENCOUNTER — Other Ambulatory Visit: Payer: Self-pay | Admitting: *Deleted

## 2017-11-05 DIAGNOSIS — I701 Atherosclerosis of renal artery: Secondary | ICD-10-CM

## 2017-11-06 ENCOUNTER — Telehealth: Payer: Self-pay | Admitting: Cardiovascular Disease

## 2017-11-06 NOTE — Telephone Encounter (Signed)
New message  Pt verbalized that she is returning call for RN  For her results of her VAS US RENAL ARTERY DUPLEX

## 2017-11-08 ENCOUNTER — Telehealth: Payer: Self-pay

## 2017-11-08 NOTE — Telephone Encounter (Signed)
Patient left message on nurse line requesting that Fluconazole prescription that was sent be resent as brand name Diflucan.  Call back [phone]  Ples Specter, RN [facility] RN)

## 2017-11-09 ENCOUNTER — Other Ambulatory Visit: Payer: Self-pay | Admitting: Internal Medicine

## 2017-11-09 MED ORDER — FLUCONAZOLE 100 MG PO TABS: 100.0000 mg | ORAL_TABLET | Freq: Every day | ORAL | 0 refills | Status: AC

## 2017-11-09 NOTE — Telephone Encounter (Signed)
New prescription sent

## 2017-11-30 NOTE — Telephone Encounter (Signed)
Notes recorded by Neta Ehlersruitt, Miriana Gaertner M, CMA on 11/06/2017 at 4:16 PM EDT Returned call to patient. Reviewed recommendations. Patient verbalized understanding and agreed with plan.

## 2017-12-05 ENCOUNTER — Other Ambulatory Visit: Payer: Self-pay | Admitting: *Deleted

## 2017-12-10 ENCOUNTER — Telehealth: Payer: Self-pay

## 2017-12-10 NOTE — Telephone Encounter (Signed)
Prior approval for Diflucan completed via CoverMyMeds Med approved for 09/11/2017 - 12/10/2018.  [facility] pharmacy informed.  Nigel MormonAlisa S Brake, RN

## 2017-12-10 NOTE — Telephone Encounter (Signed)
Clinical questions submitted via CoverMyMeds. Status pending. Will recheck status next business day.  Mylena Sedberry, RN ([facility] RN)  

## 2017-12-10 NOTE — Telephone Encounter (Signed)
[facility] of Diflucan. Clinical questions placed in PCPs box for completion. Ples SpecterAlisa Brake, RN [facility] RN)

## 2017-12-10 NOTE — Telephone Encounter (Signed)
Form filled out and placed in RN box. Thanks!

## 2018-02-07 ENCOUNTER — Other Ambulatory Visit: Payer: Self-pay | Admitting: *Deleted

## 2018-02-07 MED ORDER — PROMETHAZINE HCL 12.5 MG PO TABS: 12.5000 mg | ORAL_TABLET | Freq: Three times a day (TID) | ORAL | 0 refills | Status: AC | PRN

## 2018-03-05 ENCOUNTER — Other Ambulatory Visit: Payer: Self-pay

## 2018-03-05 ENCOUNTER — Encounter: Payer: Self-pay | Admitting: Family Medicine

## 2018-03-05 ENCOUNTER — Ambulatory Visit (INDEPENDENT_AMBULATORY_CARE_PROVIDER_SITE_OTHER): Payer: Medicare Other | Admitting: Family Medicine

## 2018-03-05 VITALS — BP 152/55 | HR 77 | Temp 97.8°F | Wt 143.0 lb

## 2018-03-05 DIAGNOSIS — R7989 Other specified abnormal findings of blood chemistry: Secondary | ICD-10-CM | POA: Diagnosis not present

## 2018-03-05 DIAGNOSIS — R059 Cough, unspecified: Secondary | ICD-10-CM

## 2018-03-05 DIAGNOSIS — R05 Cough: Secondary | ICD-10-CM

## 2018-03-05 DIAGNOSIS — M25572 Pain in left ankle and joints of left foot: Secondary | ICD-10-CM

## 2018-03-05 DIAGNOSIS — J301 Allergic rhinitis due to pollen: Secondary | ICD-10-CM | POA: Diagnosis present

## 2018-03-05 DIAGNOSIS — I701 Atherosclerosis of renal artery: Secondary | ICD-10-CM

## 2018-03-05 NOTE — Progress Notes (Signed)
  Patient Name: [name] Date of Birth: 12/01/1940 Date of Visit: 03/05/18 PCP: Mirian Mo, MD  Chief Complaint: Eyes draining and some congestion  Subjective: [name] is a pleasant 77 y.o. year old with history significant for hypertension, allergic rhinitis, colitis and coronary artery disease presenting today with a 4-day of congestion and watery eyes.  [name] has a history of significant seasonal allergies that do greatly affect her.  Over the weekend she was exposed to dust, pollen and outside grasses.  This irritated her eyes and cause congestion and somewhat dry cough. She feels like she has something stuck in her throat.  She reports her cough is mostly nonproductive at times she does have white sputum.  She denies difficulty breathing, chest pain, wheezing, purulent sputum, fevers.  She has used her albuterol inhaler twice since Saturday.  She is taking her Zyrtec once.  She is out of this medication she has not been taking her Flonase.  Of note the patient receives some of her care from Dr. Costella Hatcher in the town of her [place] home.  [name] reports overall she is doing well.  She reports some left ankle pain today for which she took 5 mg of prednisone.  She reports she is a history of osteoarthritis and colitis.  She is supposed to use the prednisone for colitis.  She takes daily NSAIDs for her osteoarthritis.  She also uses as needed tramadol for her osteoarthritis.This morning she reports her left ankle was bothering her.  She took a dose of prednisone.  No trauma, redness, swelling of the area.  She specifically denies falls.  ROS:  ROS Negative except for as above.   I have reviewed the patient's medical, surgical, family, and social history as appropriate.   Vitals:   03/05/18 1032  BP: (!) 152/55  Pulse: 77  Temp: 97.8 F (36.6 C)  SpO2: 95%   Filed Weights   03/05/18 1032  Weight: 143 lb (64.9 kg)   HEENT: Sclera anicteric. Dentition is moderate. Appears  well hydrated. Neck: Supple Cardiac: Regular rate and rhythm. Normal S1/S2. No murmurs, rubs, or gallops appreciated. Lungs: Clear bilaterally to ascultation. No wheezes, rales, or rhonchi.  Extremities: Bilateral ankles appear normal.  No erythema no redness no swelling over left ankle.  There is no tenderness to palpation over the medial or lateral epicondyles or base the fifth metatarsal.   [name] was seen today for cough.  Diagnoses and all orders for this visit:   Left Ankle Pain, likely due to osteoarthritis.  No signs of edema or erythema.  No signs suggestive of gout.  No history of trauma.  Recommended conservative measures we discussed that intermittent use of prednisone may increase her risk of infection, gastrointestinal bleeding, and bone loss.  Elevated serum creatinine, the patient takes daily Mobic.  We discussed the long-term side effects of this medication on her kidneys.  Repeat creatinine today. -     Basic Metabolic Panel  Congestion, acute condition, likely allergic.  suspect many of her symptoms today are due to her allergic rhinitis and resulting postnasal drip.  No signs of an exacerbation of her underlying asthma.  No signs of pneumonia.  We did discuss that at times allergies can certainly worsen this strict return precautions including fevers, increasing albuterol use, or no improvement with therapies recommended including restarting cetirizine and restarting Flonase.  Terisa Starr, MD  Family Medicine Teaching Service

## 2018-03-05 NOTE — Patient Instructions (Addendum)
  Flonase twice per day  START your Zyrtec   It was wonderful to see you today.  Thank you for choosing [facility].   Please call [phone] with any questions about today's appointment.  Please be sure to schedule follow up at the front  desk before you leave today.   Terisa Starr, MD  Family Medicine

## 2018-03-06 ENCOUNTER — Telehealth: Payer: Self-pay | Admitting: Family Medicine

## 2018-03-06 ENCOUNTER — Telehealth: Payer: Self-pay | Admitting: Cardiovascular Disease

## 2018-03-06 DIAGNOSIS — N179 Acute kidney failure, unspecified: Secondary | ICD-10-CM

## 2018-03-06 LAB — BASIC METABOLIC PANEL
BUN / CREAT RATIO: 19 (ref 12–28)
BUN: 24 mg/dL (ref 8–27)
CHLORIDE: 102 mmol/L (ref 96–106)
CO2: 17 mmol/L — ABNORMAL LOW (ref 20–29)
Calcium: 9.7 mg/dL (ref 8.7–10.3)
Creatinine, Ser: 1.28 mg/dL — ABNORMAL HIGH (ref 0.57–1.00)
GFR, EST AFRICAN AMERICAN: 47 mL/min/{1.73_m2} — AB (ref >59)
GFR, EST NON AFRICAN AMERICAN: 41 mL/min/{1.73_m2} — AB (ref >59)
Glucose: 101 mg/dL — ABNORMAL HIGH (ref 65–99)
Potassium: 4.4 mmol/L (ref 3.5–5.2)
Sodium: 133 mmol/L — ABNORMAL LOW (ref 134–144)

## 2018-03-06 NOTE — Telephone Encounter (Signed)
New Message:      Patient is request a call back

## 2018-03-06 NOTE — Telephone Encounter (Signed)
Called Mrs. Linnen back.  We discussed her mild acute kidney injury.  She reports she is also been taking BC powder over the last week.  She reports she was previously told by her cardiologist she could not take Tylenol due to her heart.  I told her I would look into any new data about this.  I will review with our pharmacy team at this time my recommendation is that she take Tylenol rather than meloxicam.  She reports she is a history of having a stent in her left renal artery.  We discussed that meloxicam is not ideal for kidney function nor heart function.  We discussed alternative pain control measurements including Tylenol.  She does have as needed tramadol which I think is appropriate in the setting.  We discussed that I am happy to see her for her lower extremity pain.  She reports she has an upcoming screening test for peripheral arterial disease.  This may indeed be contributing to her lower extremity pain she certainly has osteoarthritis in her bilateral lower ankles.  We reviewed reasons to call and return to care she will come in next week to repeat her creatinine.  Discontinue meloxicam recommend against NSAIDs in the future reviewed all NSAIDs with her at length

## 2018-03-06 NOTE — Telephone Encounter (Signed)
Patient called in stating that she had blood work completed by Dr. Manson Passey and her creatinine lab was worse. It was now 1.28, and she was told by Dr.Croitoru to monitor the arteries in her kidneys. She wanted a follow up on certain days due to not living here, I checked Dr.C's scheduled but he was not in office, so I scheduled with a PA to be evaluated. Patient denies any other symptoms.

## 2018-03-06 NOTE — Telephone Encounter (Signed)
Attempted to call patient, voicemail full.   Nursing- please call patient. Kidney function is slightly elevated from prior. I recommend she stop Mobic. Repeat ordered for 1 week. Can obtain anytime next week. Let me know if she has questions.   Terisa Starr, MD  Family Medicine Teaching Service

## 2018-03-06 NOTE — Telephone Encounter (Signed)
Pt called nurse line requesting results. I informed pt of her recent lab work and the need stop mobic. Pt does not understand why, "I am in a lot of pain, I need to take something." "what can I take??" Informed pt NSAIDS are not the best choice. Pt requesting to speak with provider.

## 2018-03-08 ENCOUNTER — Telehealth: Payer: Self-pay

## 2018-03-08 NOTE — Telephone Encounter (Signed)
Patient left voicemail requesting to speak with Dr Manson PasseyBrown again regarding results.  Call back is [phone]  Ples SpecterAlisa Robbi Scurlock, RN [facility] RN)

## 2018-03-08 NOTE — Telephone Encounter (Signed)
Patient called back. Discussed results, lab appointment Monday before she [place] beach for the [place].  Terisa Starrarina Brown, MD  Family Medicine Teaching Service

## 2018-03-08 NOTE — Telephone Encounter (Signed)
Left voicemail to return call.   Terisa Starrarina Shamar Kracke, MD  Family Medicine

## 2018-03-11 ENCOUNTER — Other Ambulatory Visit: Payer: Medicare Other

## 2018-03-11 DIAGNOSIS — N179 Acute kidney failure, unspecified: Secondary | ICD-10-CM

## 2018-03-12 ENCOUNTER — Telehealth: Payer: Self-pay | Admitting: Family Medicine

## 2018-03-12 DIAGNOSIS — M19079 Primary osteoarthritis, unspecified ankle and foot: Secondary | ICD-10-CM

## 2018-03-12 LAB — BASIC METABOLIC PANEL
BUN/Creatinine Ratio: 21 (ref 12–28)
BUN: 20 mg/dL (ref 8–27)
CHLORIDE: 102 mmol/L (ref 96–106)
CO2: 20 mmol/L (ref 20–29)
Calcium: 9.2 mg/dL (ref 8.7–10.3)
Creatinine, Ser: 0.94 mg/dL (ref 0.57–1.00)
GFR calc Af Amer: 69 mL/min/{1.73_m2} (ref >59)
GFR calc non Af Amer: 60 mL/min/{1.73_m2} (ref >59)
GLUCOSE: 87 mg/dL (ref 65–99)
POTASSIUM: 4.2 mmol/L (ref 3.5–5.2)
SODIUM: 136 mmol/L (ref 134–144)

## 2018-03-12 MED ORDER — DICLOFENAC SODIUM 1 % TD GEL: 4.0000 g | Freq: Four times a day (QID) | TRANSDERMAL | 11 refills | Status: AC

## 2018-03-12 NOTE — Telephone Encounter (Signed)
Called with results.   Shevaun Lovan, MD  Family Medicine Teaching Service   

## 2018-04-08 NOTE — Progress Notes (Deleted)
Cardiology Office Note:    Date:  04/08/2018   ID:  [name], DOB [birth year], MRN [id number]  PCP:  Mirian Mo, MD  Cardiologist:  Thurmon Fair, MD   Referring MD: Mirian Mo, MD   No chief complaint on file. ***  History of Present Illness:    [name] is a 77 y.o. female with a hx of HTN, renal artery stenosis s/p right artery stent, subclavian steal syndrome s/p stent, and CAD. [place] in [place] and sees a different cardiologist near [facility]. Heart cath 2010 with 40-50% stenosis in LAD, 40% Cx, 40-50% RCA with catheter spasm, and normal EF. Normal myoview 2017. Echo 2017 with grade 1 DD.  She was last seen in clinic on 08/20/17 with Dr. Royann Shivers. Most of her complaints at that time were GI. Anemia was from poor absorption, not active bleeding. She reported an episode of chest discomfort felt to not be cardiac in nature. Repeat carotid and renal duplex ultrasound completed and showed slight progression of renal artery disease, but not obstructive, and no significant disease in carotids.   She returns today for follow up.        Past Medical History:  Diagnosis Date  . Arthritis   . Asthma   . CAD (coronary artery disease) 08/2008   LAD 40-50%, CFX 40%, RCA 40-50% w/ catheter spasm, EF > 60%  . History of nuclear stress test 01/2015   No scar or ischemia, EF > 80%  . Hypertension   . PVD (peripheral vascular disease) (HCC)    left subclavian stent & right renal stent 2010, subclavian stent patent on Doppler 2015  . UC (ulcerative colitis) [facility])     Past Surgical History:  Procedure Laterality Date  . ABDOMINAL HYSTERECTOMY     for concern for CA  . ABDOMINAL SURGERY    . APPENDECTOMY    . CARDIAC CATHETERIZATION  09/17/2008   mild noncritical coronary artery disease-recommend medical therapy, high-grade left subclavian stenosis-recommend left subclavian percutaneous peripheral intervention (PPI). recommend doppler surveillance of renal  arteries for mild left and moderate right renal artery stenosis  . CARDIOVASCULAR STRESS TEST  03/21/2012   normal pattern of perfusion in all regions, no scintigraphic evidence of inducible myocardial ischemia, no EKG changes for ischemia.  Marland Kitchen CAROTID DUPLEX  03/13/2013   bilateral ICAs demonstrated normal patency without evidence of a significant diameter reduction, moderate tortuosity noted throughout the left ICA with falsley elevated velocities in the mid sigment. left subclavian arterial stent demonstrated normal velocities without suggestion of a significant diameter reduction  . CHOLECYSTECTOMY    . COLON SURGERY N/A 03/26/2006   Partial distal small bowel resection with primary anastamosis for volvulus w/infarction  . DOPPLER ECHOCARDIOGRAPHY  03/21/2012   EF >55%, mild concentric LVH, LV systolic function is normal, there is aortic root sclerosis/calcification  . SUBCLAVIAN VEIN ANGIOPLASTY / STENTING Left 09/28/2008   L Subclavian 70-80% stenosis-10.0x31mm self-expanding stent post dilated with a 7.0x48mm FoxCross balloon. stenosis reduced from 70-80% to 0% residual. Stenting of R femoral artery 70% narrowing-5.0x12 Herculink stent deployed at 10atm and postdilated at 14atm resulting in reduction from 70% stenosis to 0% residual  . URETERAL STENT PLACEMENT      Current Medications: No outpatient medications have been marked as taking for the 04/09/18 encounter (Appointment) with Marcelino Duster, PA.     Allergies:   Budesonide-formoterol fumarate; Codeine; Hydrochlorothiazide; Losartan; Sulfonamide derivatives; Tramadol; Triamterene; Ciprofloxacin; Clarinex [desloratadine]; Nebivolol; Neomycin; Polyethylene glycol;  Rosuvastatin; Sulfabenzamide; Valsartan; Meloxicam; Nitrofurantoin; and Nitrofurantoin monohyd macro   Social History   Socioeconomic History  . Marital status: Single    Spouse name: Not on file  . Number of children: Not on file  . Years of education: Not on file  .  Highest education level: Not on file  Occupational History  . Not on file  Social Needs  . Financial resource strain: Not on file  . Food insecurity:    Worry: Not on file    Inability: Not on file  . Transportation needs:    Medical: Not on file    Non-medical: Not on file  Tobacco Use  . Smoking status: Former Games developer  . Smokeless tobacco: Never Used  Substance and Sexual Activity  . Alcohol use: No  . Drug use: Not on file  . Sexual activity: Not on file  Lifestyle  . Physical activity:    Days per week: Not on file    Minutes per session: Not on file  . Stress: Not on file  Relationships  . Social connections:    Talks on phone: Not on file    Gets together: Not on file    Attends religious service: Not on file    Active member of club or organization: Not on file    Attends meetings of clubs or organizations: Not on file    Relationship status: Not on file  Other Topics Concern  . Not on file  Social History Narrative  . Not on file     Family History: The patient's ***family history includes Cancer in her maternal grandmother; Heart attack in her maternal grandmother and mother.  ROS:   Please see the history of present illness.    *** All other systems reviewed and are negative.  EKGs/Labs/Other Studies Reviewed:    The following studies were reviewed today:  Echo 09/21/15: Study Conclusions - Left ventricle: The cavity size was normal. Wall thickness was   increased in a pattern of mild LVH. Systolic function was normal.   The estimated ejection fraction was in the range of 60% to 65%.   Wall motion was normal; there were no regional wall motion   abnormalities. Doppler parameters are consistent with abnormal   left ventricular relaxation (grade 1 diastolic dysfunction). - Aortic valve: There was no stenosis. - Mitral valve: Mildly calcified annulus. Mildly calcified leaflets   . There was no significant regurgitation. - Right ventricle: The cavity  size was normal. Systolic function   was normal. - Tricuspid valve: Peak RV-RA gradient (S): 29 mm Hg. - Pulmonary arteries: PA peak pressure: 32 mm Hg (S). - Inferior vena cava: The vessel was normal in size. The   respirophasic diameter changes were in the normal range (>= 50%),   consistent with normal central venous pressure.  Impressions: - Normal LV size with mild LV hypertrophy. EF 60-65%. Normal RV   size and systolic function. No significant valvular   abnormalities.  EKG:  EKG is *** ordered today.  The ekg ordered today demonstrates ***  Recent Labs: 03/11/2018: BUN 20; Creatinine, Ser 0.94; Potassium 4.2; Sodium 136  Recent Lipid Panel    Component Value Date/Time   CHOL 166 04/23/2008 2300   TRIG 90 04/23/2008 2300   HDL 64 04/23/2008 2300   CHOLHDL 2.6 Ratio 04/23/2008 2300   VLDL 18 04/23/2008 2300   LDLCALC 84 04/23/2008 2300    Physical Exam:    VS:  There were no vitals taken for  this visit.    Wt Readings from Last 3 Encounters:  03/05/18 143 lb (64.9 kg)  08/20/17 141 lb 3.2 oz (64 kg)  05/24/17 137 lb (62.1 kg)     GEN: *** Well nourished, well developed in no acute distress HEENT: Normal NECK: No JVD; No carotid bruits LYMPHATICS: No lymphadenopathy CARDIAC: ***RRR, no murmurs, rubs, gallops RESPIRATORY:  Clear to auscultation without rales, wheezing or rhonchi  ABDOMEN: Soft, non-tender, non-distended MUSCULOSKELETAL:  No edema; No deformity  SKIN: Warm and dry NEUROLOGIC:  Alert and oriented x 3 PSYCHIATRIC:  Normal affect   ASSESSMENT:    No diagnosis found. PLAN:    In order of problems listed above:  No diagnosis found.   Medication Adjustments/Labs and Tests Ordered: Current medicines are reviewed at length with the patient today.  Concerns regarding medicines are outlined above.  No orders of the defined types were placed in this encounter.  No orders of the defined types were placed in this encounter.   Signed, Marcelino Duster, PA  04/08/2018 9:50 AM    [facility]

## 2018-04-09 ENCOUNTER — Ambulatory Visit: Payer: Medicare Other | Admitting: Physician Assistant

## 2018-06-04 ENCOUNTER — Other Ambulatory Visit: Payer: Self-pay | Admitting: Internal Medicine

## 2018-07-01 ENCOUNTER — Ambulatory Visit: Payer: Medicare Other | Admitting: Cardiovascular Disease

## 2018-07-10 ENCOUNTER — Ambulatory Visit: Payer: Medicare Other | Admitting: Family Medicine

## 2018-08-06 ENCOUNTER — Ambulatory Visit (INDEPENDENT_AMBULATORY_CARE_PROVIDER_SITE_OTHER): Payer: Medicare Other | Admitting: Family Medicine

## 2018-08-06 ENCOUNTER — Other Ambulatory Visit: Payer: Self-pay

## 2018-08-06 VITALS — BP 160/70 | HR 73 | Temp 97.5°F | Wt 140.0 lb

## 2018-08-06 DIAGNOSIS — L509 Urticaria, unspecified: Secondary | ICD-10-CM

## 2018-08-06 MED ORDER — DIPHENHYDRAMINE-ZINC ACETATE 2-0.1 % EX CREA: 1.0000 "application " | TOPICAL_CREAM | Freq: Three times a day (TID) | CUTANEOUS | 0 refills | Status: AC | PRN

## 2018-08-06 NOTE — Progress Notes (Signed)
   Subjective:   Patient ID: [name]    DOB: [birth year], 78 y.o. female   MRN: [id number]  CC: hives   HPI: [name] is a 78 y.o. female who presents to clinic today for the following issue.    Hives  Broke out into a rash about 4-5 days ago.  Started on left side of her forehead and spread to rest of the face.  Has it on her arms and legs.  Rash is itchy.  Has been applying clobetasol propionate cream.  She owns a cat but has no known allergies to him. History of extensive allergy to several medications but has not started any new medications recently. She has a carpeted home and is a former smoker - quit about 25 years ago.  Has seen an allergy specialist in the past, is taking hydroxyzine 25 mg BID.  She denies SOB, lip or tongue swelling.  No sensation of her throat closing up.  She feels like the breakouts are due to the fillers that are in her medications.  She took 2 tabs of her 2 mg Medrol last night which she feels did not help.   ROS: No fever, chills, nausea, vomiting.    Social: pt is a former smoker.  Medications reviewed. Objective:   BP (!) 160/70   Pulse 73   Temp (!) 97.5 F (36.4 C) (Oral)   Wt 140 lb (63.5 kg)   SpO2 94%   BMI 26.80 kg/m  Vitals and nursing note reviewed.  General: 78 year old female, NAD  HEENT: NCAT , EOMI, no rhinorrhea or conjunctival injection, no lip or tongue swelling  Neck: supple, no LAD  CV: RRR no MRG  Lungs: CTAB, normal effort  Abdomen: soft, NTND, +bs  Skin: warm, dry, several patches of hives over bilateral LE and upper arms including forehead  Extremities: warm and well perfused, normal tone   Assessment & Plan:   Hives Physical exam notable for patchy hives over bilateral UE and forehead.  No red flags of lip/tongue swelling or SOB.  Recommend continue home hydroxyzine 25 mg twice daily  She has extensive history of allergies to several medications which limits use of topical hydrocortisone and other  medications -Rx: Diphenhydramine-zinc acetate, to be applied topically 3 times daily PRN  Strict return precautions discussed Handout provided, discussed avoidance of known triggers   Meds ordered this encounter  Medications  . diphenhydrAMINE-zinc acetate (BENADRYL) cream    Sig: Apply 1 application topically 3 (three) times daily as needed for itching.    Dispense:  28.4 g    Refill:  0   Freddrick March, MD [facility] PGY-3

## 2018-08-06 NOTE — Patient Instructions (Signed)
Hives  Hives are itchy, red, swollen areas on your skin. Hives can show up on any part of your body. Hives often fade within 24 hours (acute hives). New hives can show up after old ones fade. This can go on for many days or weeks (chronic hives). Hives do not spread from person to person (are not contagious).  Hives are caused by your body's response to something that you are allergic to (allergen). These are sometimes called triggers. You can get hives right after being around a trigger, or hours later.  What are the causes?  · Allergies to foods.  · Insect bites or stings.  · Pollen.  · Pets.  · Latex.  · Chemicals.  · Spending time in sunlight, heat, or cold.  · Exercise.  · Stress.  · Some medicines.  · Viruses. This includes the common cold.  · Infections caused by germs (bacteria).  · Allergy shots.  · Blood transfusions.  Sometimes, the cause is not known.  What increases the risk?  · Being a woman.  · Being allergic to foods such as:  ? Citrus fruits.  ? Milk.  ? Eggs.  ? Peanuts.  ? Tree nuts.  ? Shellfish.  · Being allergic to:  ? Medicines.  ? Latex.  ? Insects.  ? Animals.  ? Pollen.  What are the signs or symptoms?    · Raised, itchy, red or white bumps or patches on your skin. These areas may:  ? Get large and swollen.  ? Change in shape and location.  ? Stand alone or connect to each other over a large area of skin.  ? Sting or hurt.  ? Turn white when pressed in the center (blanch).  In very bad cases, your hands, feet, and face may also get swollen. This may happen if hives start deeper in your skin.  How is this treated?  Treatment for this condition depends on your symptoms. Treatment may include:  · Using cool, wet cloths (cool compresses) or taking cool showers to stop the itching.  · Medicines that help:  ? Relieve itching (antihistamines).  ? Reduce swelling (corticosteroids).  ? Treat infection (antibiotics).  · A medicine (omalizumab) that is given as a shot (injection). Your doctor may  prescribe this if you have hives that do not get better even after other treatments.  · In very bad cases, you may need a shot of a medicine called epinephrineto prevent a life-threatening allergic reaction (anaphylaxis).  Follow these instructions at home:  Medicines  · Take or apply over-the-counter and prescription medicines only as told by your doctor.  · If you were prescribed an antibiotic medicine, use it as told by your doctor. Do not stop using it even if you start to feel better.  Skin care  · Apply cool, wet cloths to the hives.  · Do not scratch your skin. Do not rub your skin.  General instructions  · Do not take hot showers or baths. This can make itching worse.  · Do not wear tight clothes.  · Use sunscreen and wear clothes that cover your skin when you are outside.  · Avoid any triggers that cause your hives. Keep a journal to help track what causes your hives. Write down:  ? What medicines you take.  ? What you eat and drink.  ? What products you use on your skin.  · Keep all follow-up visits as told by your doctor. This is important.  Contact   a doctor if:  · Your symptoms are not better with medicine.  · Your joints hurt or are swollen.  Get help right away if:  · You have a fever.  · You have pain in your belly (abdomen).  · Your tongue or lips are swollen.  · Your eyelids are swollen.  · Your chest or throat feels tight.  · You have trouble breathing or swallowing.  These symptoms may be an emergency. Do not wait to see if the symptoms will go away. Get medical help right away. Call your local emergency services (911 in the [place] Do not drive yourself to the hospital.  Summary  · Hives are itchy, red, swollen areas on your skin.  · Treatment for this condition depends on your symptoms.  · Avoid things that cause your hives. Keep a journal to help track what causes your hives.  · Take and apply over-the-counter and prescription medicines only as told by your doctor.  · Keep all follow-up visits  as told by your doctor. This is important.  This information is not intended to replace advice given to you by your health care provider. Make sure you discuss any questions you have with your health care provider.  Document Released: 03/21/2008 Document Revised: 12/26/2017 Document Reviewed: 12/26/2017  Elsevier Interactive Patient Education © 2019 [employer]

## 2018-08-09 ENCOUNTER — Ambulatory Visit: Payer: Medicare Other | Admitting: Cardiovascular Disease

## 2018-08-16 ENCOUNTER — Ambulatory Visit: Payer: Medicare Other | Admitting: Cardiovascular Disease

## 2018-09-06 ENCOUNTER — Ambulatory Visit: Payer: Medicare Other | Admitting: Physician Assistant

## 2018-09-16 ENCOUNTER — Ambulatory Visit: Payer: Medicare Other | Admitting: Cardiovascular Disease

## 2018-09-18 ENCOUNTER — Telehealth: Payer: Self-pay | Admitting: Cardiovascular Disease

## 2018-09-18 NOTE — Telephone Encounter (Signed)
STAT if patient feels like he/she is going to faint   1) Are you dizzy now?  Yes a little  2) Do you feel faint or have you passed out? no  3) Do you have any other symptoms? Little lightheaded- feels like she is staggering sometimes-she said she felt like the time she had to have stents in  4) Have you checked your HR and BP (record if available)? no

## 2018-09-18 NOTE — Telephone Encounter (Signed)
Pt called to report that she has had some light-headedness over the past week... she denies chest pain, sob...she does not know what her BP is running... she is currently at [facility] for the next couple of months... she has ulcerative colitis and has had diarrhea and Dr. Darnelle Catalan has called her in some meds for it but she has not been to the RX to pick up.. she says he will try drinking some Pedialyte which she drinks when she has her diarrhea.. she also reports that she has not been to see Dr. Jaclyn Prime for her FE infusions in [place].. I strongly urged her to talk with her PMD.. Dr. Homero Fellers or Dr. Darnelle Catalan since she may be a little dehydrated from the diarrhea and to talk with Dr. Jaclyn Prime about her FE and if this is the cause of her light-headedness since she is overdue for her infusion..  Pt agreed and will make appt with Dr. Royann Shivers since it her last OV was 07/2017.. pt agreed and will have a scheduler call her to make an appt when [place] the beach [place].Marland Kitchen will forward to Dr. Royann Shivers.

## 2018-10-16 ENCOUNTER — Telehealth: Payer: Self-pay | Admitting: Cardiovascular Disease

## 2018-10-16 NOTE — Telephone Encounter (Signed)
Called patient and LVM to call and schedule 1 year followup.  °

## 2018-10-16 NOTE — Telephone Encounter (Signed)
New Message:    Patient returning a call back concering her 1 year follow up:  Patient states she need a doppler done first. Please call patient back.

## 2018-11-01 ENCOUNTER — Telehealth: Payer: Self-pay | Admitting: Cardiovascular Disease

## 2018-11-01 DIAGNOSIS — M79604 Pain in right leg: Secondary | ICD-10-CM

## 2018-11-01 DIAGNOSIS — I6523 Occlusion and stenosis of bilateral carotid arteries: Secondary | ICD-10-CM

## 2018-11-01 NOTE — Telephone Encounter (Signed)
Message routed to Dr.Croitoru to advise on what testing is needed prior to f/u yearly appt.

## 2018-11-01 NOTE — Telephone Encounter (Signed)
I called patient to schedule her f/u appointment with Dr. Gretta Cool states she needs to have dopplers completed before she sees him.  She needs to have her abdominal aorta, carotids, legs and kidneys checked before she sees Dr. Royann Shivers. The only order in the system is for the renal.  Please let me know when the orders are in and I will call the patient and schedule.

## 2018-11-02 NOTE — Telephone Encounter (Signed)
Please schedule for bilateral carotid and renal artery Dopplers (for carotid stenosis and renal artery stenosis, respectively). Neither is urgent. Her office appt (or virtual visit) should be scheduled to follow these tests, not before, please. MCr

## 2018-11-05 NOTE — Telephone Encounter (Signed)
Please advise,  I hope it is okay to send messages to you.  Thank you

## 2018-11-06 ENCOUNTER — Other Ambulatory Visit: Payer: Self-pay | Admitting: *Deleted

## 2018-11-06 NOTE — Telephone Encounter (Signed)
I called patientto schedule renal and carotid dopplers and she states she usually has her legs done as well.  Please advise

## 2018-11-06 NOTE — Telephone Encounter (Signed)
Unable to reach pt recording states party not available. Will try later./cy

## 2018-11-07 NOTE — Telephone Encounter (Signed)
Lm to call back ./cy 

## 2018-11-11 ENCOUNTER — Other Ambulatory Visit: Payer: Self-pay | Admitting: Cardiovascular Disease

## 2018-11-11 DIAGNOSIS — I701 Atherosclerosis of renal artery: Secondary | ICD-10-CM

## 2018-11-11 NOTE — Telephone Encounter (Signed)
Bilateral ABIs, please. Re: leg pain

## 2018-11-11 NOTE — Telephone Encounter (Signed)
Pt aware may proceed with ultrasound of legs as well Will forward to [place] to schedule tests ./cy

## 2018-11-11 NOTE — Telephone Encounter (Signed)
Spoke with pt and hs been having a lot of leg pain can we do ultrasound on legs as well ?

## 2018-12-13 ENCOUNTER — Encounter (HOSPITAL_COMMUNITY): Payer: Medicare Other

## 2018-12-16 ENCOUNTER — Encounter (HOSPITAL_COMMUNITY): Payer: Medicare Other

## 2019-01-07 ENCOUNTER — Ambulatory Visit (HOSPITAL_COMMUNITY): Payer: Medicare Other

## 2019-01-07 ENCOUNTER — Encounter (HOSPITAL_COMMUNITY): Payer: Medicare Other

## 2019-02-18 ENCOUNTER — Encounter (HOSPITAL_COMMUNITY): Payer: Medicare Other

## 2019-02-26 ENCOUNTER — Ambulatory Visit: Payer: Medicare Other | Admitting: Cardiovascular Disease

## 2019-02-26 ENCOUNTER — Encounter (HOSPITAL_COMMUNITY): Payer: Self-pay
